# Patient Record
Sex: Male | Born: 1955 | Race: White | Hispanic: No | Smoking: Current every day smoker
Health system: Southern US, Community
[De-identification: ages and names within clinical notes are randomized; demographics above are authoritative.]

## PROBLEM LIST (undated history)

## (undated) DIAGNOSIS — E669 Obesity, unspecified: Secondary | ICD-10-CM

## (undated) DIAGNOSIS — Z0000 Encounter for general adult medical examination without abnormal findings: Secondary | ICD-10-CM

## (undated) DIAGNOSIS — M1990 Unspecified osteoarthritis, unspecified site: Secondary | ICD-10-CM

## (undated) DIAGNOSIS — E119 Type 2 diabetes mellitus without complications: Secondary | ICD-10-CM

## (undated) DIAGNOSIS — I82409 Acute embolism and thrombosis of unspecified deep veins of unspecified lower extremity: Secondary | ICD-10-CM

## (undated) DIAGNOSIS — Z23 Encounter for immunization: Secondary | ICD-10-CM

## (undated) DIAGNOSIS — F17200 Nicotine dependence, unspecified, uncomplicated: Secondary | ICD-10-CM

## (undated) DIAGNOSIS — N189 Chronic kidney disease, unspecified: Secondary | ICD-10-CM

## (undated) DIAGNOSIS — K2970 Gastritis, unspecified, without bleeding: Secondary | ICD-10-CM

## (undated) HISTORY — DX: Encounter for immunization: Z23

## (undated) HISTORY — DX: Type 2 diabetes mellitus without complications: E11.9

## (undated) HISTORY — DX: Obesity, unspecified: E66.9

## (undated) HISTORY — PX: WISDOM TOOTH EXTRACTION: SHX21

## (undated) HISTORY — DX: Nicotine dependence, unspecified, uncomplicated: F17.200

## (undated) HISTORY — DX: Encounter for general adult medical examination without abnormal findings: Z00.00

---

## 1994-05-24 DIAGNOSIS — I82409 Acute embolism and thrombosis of unspecified deep veins of unspecified lower extremity: Secondary | ICD-10-CM

## 1994-05-24 HISTORY — DX: Acute embolism and thrombosis of unspecified deep veins of unspecified lower extremity: I82.409

## 2009-03-08 ENCOUNTER — Emergency Department: Payer: Self-pay | Admitting: Internal Medicine

## 2009-10-01 ENCOUNTER — Emergency Department: Payer: Self-pay | Admitting: Emergency Medicine

## 2009-10-01 ENCOUNTER — Ambulatory Visit: Payer: Self-pay | Admitting: Family Medicine

## 2013-05-19 ENCOUNTER — Emergency Department: Payer: Self-pay | Admitting: Emergency Medicine

## 2013-05-19 LAB — COMPREHENSIVE METABOLIC PANEL WITH GFR
Albumin: 3.9 g/dL
Alkaline Phosphatase: 83 U/L
Anion Gap: 5 — ABNORMAL LOW
BUN: 14 mg/dL
Bilirubin,Total: 1.3 mg/dL — ABNORMAL HIGH
Calcium, Total: 8.5 mg/dL
Chloride: 107 mmol/L
Co2: 25 mmol/L
Creatinine: 0.74 mg/dL
EGFR (African American): >60
EGFR (Non-African Amer.): >60
Glucose: 124 mg/dL — ABNORMAL HIGH
Osmolality: 276
Potassium: 3.9 mmol/L
SGOT(AST): 33 U/L
SGPT (ALT): 33 U/L
Sodium: 137 mmol/L
Total Protein: 7.4 g/dL

## 2013-05-19 LAB — CBC WITH DIFFERENTIAL/PLATELET
Basophil %: 0.6 %
Eosinophil #: 0.1 10*3/uL (ref 0.0–0.7)
Eosinophil %: 0.7 %
HCT: 43.6 % (ref 40.0–52.0)
HGB: 15.4 g/dL (ref 13.0–18.0)
Lymphocyte #: 1.4 10*3/uL (ref 1.0–3.6)
Lymphocyte %: 12.9 %
Neutrophil #: 8.2 10*3/uL — ABNORMAL HIGH (ref 1.4–6.5)
Neutrophil %: 74.8 %
Platelet: 190 10*3/uL (ref 150–440)
RBC: 4.9 10*6/uL (ref 4.40–5.90)
RDW: 13.2 % (ref 11.5–14.5)
WBC: 11 10*3/uL — ABNORMAL HIGH (ref 3.8–10.6)

## 2013-05-19 LAB — URINALYSIS, COMPLETE
Bacteria: NONE SEEN
Bilirubin,UR: NEGATIVE
Blood: NEGATIVE
Glucose,UR: NEGATIVE mg/dL
Ketone: NEGATIVE
Leukocyte Esterase: NEGATIVE
Nitrite: NEGATIVE
Ph: 6
Protein: NEGATIVE
RBC,UR: 1 /HPF
Specific Gravity: 1.025
Squamous Epithelial: NONE SEEN
WBC UR: <1 /HPF

## 2013-05-19 LAB — LIPASE, BLOOD: Lipase: 228 U/L (ref 73–393)

## 2015-02-17 ENCOUNTER — Encounter: Payer: Self-pay | Admitting: Family Medicine

## 2015-02-17 ENCOUNTER — Ambulatory Visit (INDEPENDENT_AMBULATORY_CARE_PROVIDER_SITE_OTHER): Payer: No Typology Code available for payment source | Admitting: Family Medicine

## 2015-02-17 VITALS — BP 130/87 | HR 92 | Temp 97.4°F | Resp 16 | Ht 66.0 in | Wt 215.6 lb

## 2015-02-17 DIAGNOSIS — E669 Obesity, unspecified: Secondary | ICD-10-CM | POA: Diagnosis not present

## 2015-02-17 DIAGNOSIS — F17200 Nicotine dependence, unspecified, uncomplicated: Secondary | ICD-10-CM

## 2015-02-17 DIAGNOSIS — Z720 Tobacco use: Secondary | ICD-10-CM

## 2015-02-17 DIAGNOSIS — E66811 Obesity, class 1: Secondary | ICD-10-CM

## 2015-02-17 DIAGNOSIS — S39012A Strain of muscle, fascia and tendon of lower back, initial encounter: Secondary | ICD-10-CM

## 2015-02-17 DIAGNOSIS — E119 Type 2 diabetes mellitus without complications: Secondary | ICD-10-CM | POA: Diagnosis not present

## 2015-02-17 HISTORY — DX: Nicotine dependence, unspecified, uncomplicated: F17.200

## 2015-02-17 HISTORY — DX: Obesity, class 1: E66.811

## 2015-02-17 HISTORY — DX: Obesity, unspecified: E66.9

## 2015-02-17 LAB — POCT GLYCOSYLATED HEMOGLOBIN (HGB A1C): Hemoglobin A1C: 6.1

## 2015-02-17 MED ORDER — NAPROXEN 500 MG PO TABS: 500.0000 mg | ORAL_TABLET | Freq: Two times a day (BID) | ORAL | Status: DC

## 2015-02-17 MED ORDER — CYCLOBENZAPRINE HCL 10 MG PO TABS: 10.0000 mg | ORAL_TABLET | Freq: Every day | ORAL | Status: DC

## 2015-02-17 NOTE — Progress Notes (Signed)
Date:  02/17/2015   Name:  Craig Bruce   DOB:  17-Apr-1956   MRN:  [id number]  PCP:  Dicky Doe, MD    Chief Complaint: Back Pain   History of Present Illness:  This is a 59 y.o. male c/o R LBP for 4 days after carrying heavy box at work, no acute pain but developed later that evening, Aleve helps some but pain returns, sharp/throbbing, worse with sitting, no radiation, not sleeping well at night. Also hx T2Dm, on metformin in past, last a1c 1 year ago.   Review of Systems:  Review of Systems  Constitutional: Negative for fever and chills.  Cardiovascular: Negative for leg swelling.  Gastrointestinal: Negative for abdominal pain.  Neurological: Negative for weakness and numbness.    Patient Active Problem List   Diagnosis Date Noted  . Obesity (BMI 30.0-34.9) 02/17/2015  . Smoker 02/17/2015  . Diabetes 02/17/2015    Prior to Admission medications   Medication Sig Start Date End Date Taking? Authorizing Yanissa Michalsky  cyclobenzaprine (FLEXERIL) 10 MG tablet Take 1 tablet (10 mg total) by mouth at bedtime. 02/17/15   Adline Potter, MD  naproxen (NAPROSYN) 500 MG tablet Take 1 tablet (500 mg total) by mouth 2 (two) times daily with a meal. 02/17/15   Adline Potter, MD    Not on File  No past surgical history on file.  Social History  Substance Use Topics  . Smoking status: Current Some Day Smoker  . Smokeless tobacco: Not on file  . Alcohol Use: 0.0 oz/week    0 Standard drinks or equivalent per week    No family history on file.  Medication list has been reviewed and updated.  Physical Examination: BP 130/87 mmHg  Pulse 92  Temp(Src) 97.4 F (36.3 C) (Oral)  Resp 16  Ht 5\' 6"  (1.676 m)  Wt 215 lb 9.6 oz (97.796 kg)  BMI 34.82 kg/m2  Physical Exam  Constitutional: He is oriented to person, place, and time. He appears well-developed and well-nourished.  Musculoskeletal: He exhibits no edema.  Tender R paralumbar region no spinal tenderness neg B SLR distal  strength intact  Neurological: He is alert and oriented to person, place, and time. Coordination normal.  Skin: Skin is warm and dry.  Psychiatric: He has a normal mood and affect. His behavior is normal.  Nursing note and vitals reviewed.   Assessment and Plan:  1. Lumbar strain, initial encounter Light duty note given, no lifting over 20# this week, call if sxs worsen/persist - naproxen (NAPROSYN) 500 MG tablet; Take 1 tablet (500 mg total) by mouth 2 (two) times daily with a meal.  Dispense: 30 tablet; Refill: 0 - cyclobenzaprine (FLEXERIL) 10 MG tablet; Take 1 tablet (10 mg total) by mouth at bedtime.  Dispense: 15 tablet; Refill: 0  2. Obesity (BMI 30.0-34.9) Discuss weight loss/exercise  3. Smoker Advised cessation   4. Diabetes mellitus without complication U9N today 2.3%, will not restart metformin, discuss further with Dr. Luan Pulling next visit - POCT HgB A1C   Return in about 3 months (around 05/19/2015).  Satira Anis. [facility]  02/17/2015

## 2015-03-11 ENCOUNTER — Other Ambulatory Visit: Payer: Self-pay

## 2015-03-11 DIAGNOSIS — S39012A Strain of muscle, fascia and tendon of lower back, initial encounter: Secondary | ICD-10-CM

## 2015-03-11 MED ORDER — CYCLOBENZAPRINE HCL 10 MG PO TABS: 10.0000 mg | ORAL_TABLET | Freq: Every day | ORAL | Status: DC

## 2015-03-11 MED ORDER — NAPROXEN 500 MG PO TABS: 500.0000 mg | ORAL_TABLET | Freq: Two times a day (BID) | ORAL | Status: DC

## 2015-03-24 ENCOUNTER — Encounter: Payer: Self-pay | Admitting: Family Medicine

## 2015-03-24 ENCOUNTER — Ambulatory Visit (INDEPENDENT_AMBULATORY_CARE_PROVIDER_SITE_OTHER): Payer: No Typology Code available for payment source | Admitting: Family Medicine

## 2015-03-24 VITALS — BP 130/80 | HR 92 | Temp 98.2°F | Resp 16 | Ht 67.0 in | Wt 216.0 lb

## 2015-03-24 DIAGNOSIS — F17200 Nicotine dependence, unspecified, uncomplicated: Secondary | ICD-10-CM

## 2015-03-24 DIAGNOSIS — Z23 Encounter for immunization: Secondary | ICD-10-CM | POA: Diagnosis not present

## 2015-03-24 DIAGNOSIS — Z0000 Encounter for general adult medical examination without abnormal findings: Secondary | ICD-10-CM | POA: Diagnosis not present

## 2015-03-24 DIAGNOSIS — Z720 Tobacco use: Secondary | ICD-10-CM | POA: Diagnosis not present

## 2015-03-24 DIAGNOSIS — E119 Type 2 diabetes mellitus without complications: Secondary | ICD-10-CM | POA: Diagnosis not present

## 2015-03-24 HISTORY — DX: Encounter for immunization: Z23

## 2015-03-24 HISTORY — DX: Encounter for general adult medical examination without abnormal findings: Z00.00

## 2015-03-24 LAB — POCT GLYCOSYLATED HEMOGLOBIN (HGB A1C): Hemoglobin A1C: 6

## 2015-03-24 NOTE — Patient Instructions (Signed)
Continue to watch diet 
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
## 2015-03-25 LAB — CBC WITH DIFFERENTIAL/PLATELET
BASOS ABS: 0 10*3/uL (ref 0.0–0.2)
Basos: 0 %
EOS (ABSOLUTE): 0.2 10*3/uL (ref 0.0–0.4)
Eos: 3 %
Hematocrit: 45.1 % (ref 37.5–51.0)
Hemoglobin: 16 g/dL (ref 12.6–17.7)
IMMATURE GRANS (ABS): 0 10*3/uL (ref 0.0–0.1)
IMMATURE GRANULOCYTES: 0 %
LYMPHS: 28 %
Lymphocytes Absolute: 1.6 10*3/uL (ref 0.7–3.1)
MCH: 32 pg (ref 26.6–33.0)
MCHC: 35.5 g/dL (ref 31.5–35.7)
MCV: 90 fL (ref 79–97)
Monocytes Absolute: 0.5 10*3/uL (ref 0.1–0.9)
Monocytes: 10 %
NEUTROS PCT: 59 %
Neutrophils Absolute: 3.2 10*3/uL (ref 1.4–7.0)
PLATELETS: 190 10*3/uL (ref 150–379)
RBC: 5 x10E6/uL (ref 4.14–5.80)
RDW: 13.4 % (ref 12.3–15.4)
WBC: 5.5 10*3/uL (ref 3.4–10.8)

## 2015-03-26 LAB — LIPID PANEL
CHOL/HDL RATIO: 6.4 ratio — AB (ref 0.0–5.0)
CHOLESTEROL TOTAL: 243 mg/dL — AB (ref 100–199)
HDL: 38 mg/dL — AB (ref >39)
LDL CALC: 126 mg/dL — AB (ref 0–99)
TRIGLYCERIDES: 394 mg/dL — AB (ref 0–149)
VLDL Cholesterol Cal: 79 mg/dL — ABNORMAL HIGH (ref 5–40)

## 2015-03-26 LAB — COMPREHENSIVE METABOLIC PANEL
A/G RATIO: 1.7 (ref 1.1–2.5)
ALBUMIN: 4.2 g/dL (ref 3.6–4.8)
ALT: 43 IU/L (ref 0–44)
AST: 33 IU/L (ref 0–40)
Alkaline Phosphatase: 81 IU/L (ref 39–117)
BILIRUBIN TOTAL: 0.7 mg/dL (ref 0.0–1.2)
BUN / CREAT RATIO: 21 (ref 10–22)
BUN: 15 mg/dL (ref 8–27)
CALCIUM: 8.9 mg/dL (ref 8.6–10.2)
CO2: 22 mmol/L (ref 18–29)
Chloride: 99 mmol/L (ref 97–106)
Creatinine, Ser: 0.73 mg/dL — ABNORMAL LOW (ref 0.76–1.27)
GFR, EST AFRICAN AMERICAN: 117 mL/min/{1.73_m2} (ref >59)
GFR, EST NON AFRICAN AMERICAN: 101 mL/min/{1.73_m2} (ref >59)
GLOBULIN, TOTAL: 2.5 g/dL (ref 1.5–4.5)
Glucose: 236 mg/dL — ABNORMAL HIGH (ref 65–99)
Potassium: 4.6 mmol/L (ref 3.5–5.2)
Sodium: 140 mmol/L (ref 136–144)
TOTAL PROTEIN: 6.7 g/dL (ref 6.0–8.5)

## 2015-04-07 ENCOUNTER — Other Ambulatory Visit: Payer: Self-pay | Admitting: Family Medicine

## 2015-04-07 ENCOUNTER — Telehealth: Payer: Self-pay | Admitting: Family Medicine

## 2015-04-07 DIAGNOSIS — S39012A Strain of muscle, fascia and tendon of lower back, initial encounter: Secondary | ICD-10-CM

## 2015-04-07 MED ORDER — NAPROXEN 500 MG PO TABS: 500.0000 mg | ORAL_TABLET | Freq: Two times a day (BID) | ORAL | Status: DC

## 2015-04-07 NOTE — Telephone Encounter (Signed)
I have sent in order.-jh

## 2015-04-07 NOTE — Telephone Encounter (Signed)
Pt.called need refill on   Naproxen 500 mg. Pt call back # is  [phone]

## 2015-04-30 ENCOUNTER — Ambulatory Visit: Payer: Managed Care, Other (non HMO)

## 2015-04-30 ENCOUNTER — Other Ambulatory Visit
Admission: RE | Admit: 2015-04-30 | Discharge: 2015-04-30 | Disposition: A | Discharge status: Home / Self Care | Payer: Managed Care, Other (non HMO) | Source: Ambulatory Visit | Attending: Family Medicine | Admitting: Family Medicine

## 2015-04-30 ENCOUNTER — Ambulatory Visit (INDEPENDENT_AMBULATORY_CARE_PROVIDER_SITE_OTHER): Payer: Managed Care, Other (non HMO) | Admitting: Family Medicine

## 2015-04-30 ENCOUNTER — Ambulatory Visit
Admission: RE | Admit: 2015-04-30 | Discharge: 2015-04-30 | Disposition: A | Discharge status: Home / Self Care | Payer: Managed Care, Other (non HMO) | Source: Ambulatory Visit | Attending: Family Medicine | Admitting: Family Medicine

## 2015-04-30 ENCOUNTER — Other Ambulatory Visit: Payer: Self-pay | Admitting: Family Medicine

## 2015-04-30 ENCOUNTER — Encounter: Payer: Self-pay | Admitting: Family Medicine

## 2015-04-30 VITALS — BP 128/83 | HR 94 | Resp 16 | Ht 68.0 in | Wt 220.0 lb

## 2015-04-30 DIAGNOSIS — I861 Scrotal varices: Secondary | ICD-10-CM | POA: Insufficient documentation

## 2015-04-30 DIAGNOSIS — R1031 Right lower quadrant pain: Secondary | ICD-10-CM

## 2015-04-30 DIAGNOSIS — N509 Disorder of male genital organs, unspecified: Secondary | ICD-10-CM | POA: Insufficient documentation

## 2015-04-30 DIAGNOSIS — N5089 Other specified disorders of the male genital organs: Secondary | ICD-10-CM

## 2015-04-30 DIAGNOSIS — N50819 Testicular pain, unspecified: Secondary | ICD-10-CM

## 2015-04-30 LAB — CBC WITH DIFFERENTIAL/PLATELET
BASOS ABS: 0 10*3/uL (ref 0–0.1)
BASOS PCT: 1 %
EOS ABS: 0.2 10*3/uL (ref 0–0.7)
EOS PCT: 2 %
HCT: 45.5 % (ref 40.0–52.0)
Hemoglobin: 15.6 g/dL (ref 13.0–18.0)
Lymphocytes Relative: 27 %
Lymphs Abs: 2.1 10*3/uL (ref 1.0–3.6)
MCH: 31.2 pg (ref 26.0–34.0)
MCHC: 34.3 g/dL (ref 32.0–36.0)
MCV: 91.1 fL (ref 80.0–100.0)
MONO ABS: 0.6 10*3/uL (ref 0.2–1.0)
MONOS PCT: 8 %
NEUTROS ABS: 5 10*3/uL (ref 1.4–6.5)
Neutrophils Relative %: 62 %
PLATELETS: 214 10*3/uL (ref 150–440)
RBC: 4.99 MIL/uL (ref 4.40–5.90)
RDW: 12.9 % (ref 11.5–14.5)
WBC: 7.9 10*3/uL (ref 3.8–10.6)

## 2015-04-30 MED ORDER — TRAMADOL HCL 50 MG PO TABS: 50.0000 mg | ORAL_TABLET | Freq: Three times a day (TID) | ORAL | Status: DC | PRN

## 2015-04-30 NOTE — Addendum Note (Signed)
Addended by: Devona Konig on: 04/30/2015 11:29 AM   Modules accepted: Orders

## 2015-04-30 NOTE — Progress Notes (Signed)
Name: Craig Bruce   MRN: [id number]    DOB: 09/05/1955   Date:04/30/2015       Progress Note  Subjective  Chief Complaint  Chief Complaint  Patient presents with  . Muscle Pain    Right lower abd pulled muscle and swelling in genitals.     HPI  Moving a bed 4-5  days ago.  Felt a pull in RLQ.  Pain still there and swelling into scrotum. No problem-specific assessment & plan notes found for this encounter.   History reviewed. No pertinent past medical history.  Social History  Substance Use Topics  . Smoking status: Current Some Day Smoker -- 0.50 packs/day    Types: Cigarettes  . Smokeless tobacco: Not on file  . Alcohol Use: 1.2 oz/week    0 Standard drinks or equivalent, 2 Cans of beer per week     Current outpatient prescriptions:  .  naproxen (NAPROSYN) 500 MG tablet, Take 1 tablet (500 mg total) by mouth 2 (two) times daily with a meal., Disp: 60 tablet, Rfl: 2  No Known Allergies  Review of Systems  Constitutional: Negative for fever, chills, weight loss and malaise/fatigue.  Respiratory: Negative for cough, shortness of breath and wheezing.   Cardiovascular: Negative for chest pain, palpitations and leg swelling.  Gastrointestinal: Positive for abdominal pain. Negative for heartburn, nausea, vomiting, diarrhea and blood in stool.  Musculoskeletal: Negative for myalgias and joint pain.  Neurological: Negative for weakness.      Objective  Filed Vitals:   04/30/15 1037  BP: 128/83  Pulse: 94  Resp: 16  Height: 5\' 8"  (1.727 m)  Weight: 220 lb (99.791 kg)     Physical Exam  Constitutional: He appears distressed (appears uncomfortable).  Abdominal: There is tenderness (Tender to palpation in RLQ and also in LLQ.  Pain in lower abd and into groain.  No abd. wall defect felt.).  Genitourinary:  R testis very tender to palp with some swelling of testicle.      Recent Results (from the past 2160 hour(s))  POCT HgB A1C     Status: Normal   Collection Time: 02/17/15 10:35 AM  Result Value Ref Range   Hemoglobin A1C 6.1   POCT HgB A1C     Status: Abnormal   Collection Time: 03/24/15  3:15 PM  Result Value Ref Range   Hemoglobin A1C 6.0   Comprehensive Metabolic Panel (CMET)     Status: Abnormal   Collection Time: 03/25/15  8:02 AM  Result Value Ref Range   Glucose 236 (H) 65 - 99 mg/dL   BUN 15 8 - 27 mg/dL   Creatinine, Ser 0.73 (L) 0.76 - 1.27 mg/dL   GFR calc non Af Amer 101 >59 mL/min/1.73   GFR calc Af Amer 117 >59 mL/min/1.73   BUN/Creatinine Ratio 21 10 - 22   Sodium 140 136 - 144 mmol/L   Potassium 4.6 3.5 - 5.2 mmol/L   Chloride 99 97 - 106 mmol/L   CO2 22 18 - 29 mmol/L   Calcium 8.9 8.6 - 10.2 mg/dL   Total Protein 6.7 6.0 - 8.5 g/dL   Albumin 4.2 3.6 - 4.8 g/dL   Globulin, Total 2.5 1.5 - 4.5 g/dL   Albumin/Globulin Ratio 1.7 1.1 - 2.5   Bilirubin Total 0.7 0.0 - 1.2 mg/dL   Alkaline Phosphatase 81 39 - 117 IU/L   AST 33 0 - 40 IU/L   ALT 43 0 - 44 IU/L  CBC with Differential  Status: None   Collection Time: 03/25/15  8:02 AM  Result Value Ref Range   WBC 5.5 3.4 - 10.8 x10E3/uL   RBC 5.00 4.14 - 5.80 x10E6/uL   Hemoglobin 16.0 12.6 - 17.7 g/dL   Hematocrit 45.1 37.5 - 51.0 %   MCV 90 79 - 97 fL   MCH 32.0 26.6 - 33.0 pg   MCHC 35.5 31.5 - 35.7 g/dL   RDW 13.4 12.3 - 15.4 %   Platelets 190 150 - 379 x10E3/uL   Neutrophils 59 %   Lymphs 28 %   Monocytes 10 %   Eos 3 %   Basos 0 %   Neutrophils Absolute 3.2 1.4 - 7.0 x10E3/uL   Lymphocytes Absolute 1.6 0.7 - 3.1 x10E3/uL   Monocytes Absolute 0.5 0.1 - 0.9 x10E3/uL   EOS (ABSOLUTE) 0.2 0.0 - 0.4 x10E3/uL   Basophils Absolute 0.0 0.0 - 0.2 x10E3/uL   Immature Granulocytes 0 %   Immature Grans (Abs) 0.0 0.0 - 0.1 x10E3/uL  Lipid Profile     Status: Abnormal   Collection Time: 03/25/15  8:02 AM  Result Value Ref Range   Cholesterol, Total 243 (H) 100 - 199 mg/dL   Triglycerides 394 (H) 0 - 149 mg/dL   HDL 38 (L) >39 mg/dL    Comment:  According to ATP-III Guidelines, HDL-C >59 mg/dL is considered a negative risk factor for CHD.    VLDL Cholesterol Cal 79 (H) 5 - 40 mg/dL   LDL Calculated 126 (H) 0 - 99 mg/dL   Chol/HDL Ratio 6.4 (H) 0.0 - 5.0 ratio units    Comment:                                   T. Chol/HDL Ratio                                             Men  Women                               1/2 Avg.Risk  3.4    3.3                                   Avg.Risk  5.0    4.4                                2X Avg.Risk  9.6    7.1                                3X Avg.Risk 23.4   11.0      Assessment & Plan  1. Right lower quadrant abdominal pain  - US Scrotum; Future - CBC with Differential - traMADol (ULTRAM) 50 MG tablet; Take 1 tablet (50 mg total) by mouth every 8 (eight) hours as needed.  Dispense: 20 tablet; Refill: 0  _plan referral to [facility] If testicular US is ok.

## 2015-04-30 NOTE — Addendum Note (Signed)
Addended by: Devona Konig on: 04/30/2015 11:51 AM   Modules accepted: Orders

## 2015-05-01 ENCOUNTER — Ambulatory Visit: Payer: Managed Care, Other (non HMO)

## 2015-05-01 ENCOUNTER — Encounter: Payer: Self-pay | Admitting: Urology

## 2015-05-01 ENCOUNTER — Ambulatory Visit (INDEPENDENT_AMBULATORY_CARE_PROVIDER_SITE_OTHER): Payer: Managed Care, Other (non HMO) | Admitting: Urology

## 2015-05-01 VITALS — BP 130/85 | HR 93 | Ht 68.0 in | Wt 218.3 lb

## 2015-05-01 DIAGNOSIS — N509 Disorder of male genital organs, unspecified: Secondary | ICD-10-CM | POA: Diagnosis not present

## 2015-05-01 DIAGNOSIS — N453 Epididymo-orchitis: Secondary | ICD-10-CM | POA: Diagnosis not present

## 2015-05-01 DIAGNOSIS — N5089 Other specified disorders of the male genital organs: Secondary | ICD-10-CM

## 2015-05-01 MED ORDER — SULFAMETHOXAZOLE-TRIMETHOPRIM 800-160 MG PO TABS: 1.0000 | ORAL_TABLET | Freq: Two times a day (BID) | ORAL | Status: DC

## 2015-05-01 NOTE — Addendum Note (Signed)
Addended by: Lestine Box on: 05/01/2015 03:19 PM   Modules accepted: Orders

## 2015-05-01 NOTE — Progress Notes (Signed)
05/01/2015 2:47 PM   Craig Bruce 1955-09-28 [id number]  Referring provider: Arlis Porta., MD [address] [place], [place] [address]  Chief Complaint  Patient presents with  . Testicular Mass    14 mm right testicular mass, Left varicocele    HPI: Mr Craig Bruce is a 59yo seen in consultation for a right testicular mass. He is referred by Dr. Luan Pulling. He was seen yesterday for right testicular pain which had been present for 1 week. His right testicle swelled to the size of an orange. He has severe sharp constant right testicular pain. He has pain with sitting. He denies trauma.  NO fevers/chills/sweats. He has right lower back pain.      PMH: Past Medical History  Diagnosis Date  . Diabetes mellitus type 2, controlled, without complications ([place]) A999333  . Obesity (BMI 30.0-34.9) 02/17/2015  . Smoker 02/17/2015  . Annual physical exam 03/24/2015  . Need for influenza vaccination 03/24/2015  . Immunization due 03/24/2015    Surgical History: Past Surgical History  Procedure Laterality Date  . No past surgeries      Home Medications:    Medication List       This list is accurate as of: 05/01/15  2:47 PM.  Always use your most recent med list.               naproxen 500 MG tablet  Commonly known as:  NAPROSYN  Take 1 tablet (500 mg total) by mouth 2 (two) times daily with a meal.     traMADol 50 MG tablet  Commonly known as:  ULTRAM  Take 1 tablet (50 mg total) by mouth every 8 (eight) hours as needed.        Allergies: No Known Allergies  Family History: Family History  Problem Relation Age of Onset  . Prostate cancer Neg Hx   . Kidney cancer Neg Hx   . Kidney failure Neg Hx     Social History:  reports that he has been smoking Cigarettes.  He has been smoking about 0.50 packs per day. He does not have any smokeless tobacco history on file. He reports that he drinks about 1.2 oz of alcohol per week. He reports that he does not use illicit  drugs.  ROS: UROLOGY Frequent Urination?: Yes Hard to postpone urination?: No Burning/pain with urination?: No Get up at night to urinate?: Yes Leakage of urine?: Yes Urine stream starts and stops?: No Trouble starting stream?: No Do you have to strain to urinate?: No Blood in urine?: No Urinary tract infection?: No Sexually transmitted disease?: No Injury to kidneys or bladder?: No Painful intercourse?: No Weak stream?: No Erection problems?: No Penile pain?: No  Gastrointestinal Nausea?: No Vomiting?: No Indigestion/heartburn?: No Diarrhea?: No Constipation?: No  Constitutional Fever: No Night sweats?: No Weight loss?: No Fatigue?: No  Skin Skin rash/lesions?: No Itching?: No  Eyes Blurred vision?: No Double vision?: No  Ears/Nose/Throat Sore throat?: No Sinus problems?: No  Hematologic/Lymphatic Swollen glands?: No Easy bruising?: No  Cardiovascular Leg swelling?: Yes Chest pain?: No  Respiratory Cough?: No Shortness of breath?: No  Endocrine Excessive thirst?: No  Musculoskeletal Back pain?: Yes Joint pain?: Yes  Neurological Headaches?: No Dizziness?: No  Psychologic Depression?: No Anxiety?: No  Physical Exam: BP 130/85 mmHg  Pulse 93  Ht 5\' 8"  (1.727 m)  Wt 99.02 kg (218 lb 4.8 oz)  BMI 33.20 kg/m2  Constitutional:  Alert and oriented, No acute distress. HEENT:  AT,  moist mucus membranes.  Trachea midline, no masses. Cardiovascular: No clubbing, cyanosis, or edema. Respiratory: Normal respiratory effort, no increased work of breathing. GI: Abdomen is soft, nontender, nondistended, no abdominal masses GU: No CVA tenderness. Uncircumcised phallus. Testes down bilaterally. Severe right testicular pain to palpation with severe right epididymal head tenderness. Normal left testicle  Skin: No rashes, bruises or suspicious lesions. Lymph: No cervical or inguinal adenopathy. Neurologic: Grossly intact, no focal deficits, moving  all 4 extremities. Psychiatric: Normal mood and affect.  Laboratory Data: Lab Results  Component Value Date   WBC 7.9 04/30/2015   HGB 15.6 04/30/2015   HCT 45.5 04/30/2015   MCV 91.1 04/30/2015   PLT 214 04/30/2015    Lab Results  Component Value Date   CREATININE 0.73* 03/25/2015    No results found for: PSA  No results found for: TESTOSTERONE  Lab Results  Component Value Date   HGBA1C 6.0 03/24/2015    Urinalysis    Component Value Date/Time   COLORURINE Yellow 05/19/2013 0254   APPEARANCEUR Clear 05/19/2013 0254   LABSPEC 1.025 05/19/2013 0254   PHURINE 6.0 05/19/2013 0254   GLUCOSEU Negative 05/19/2013 0254   HGBUR Negative 05/19/2013 0254   BILIRUBINUR Negative 05/19/2013 0254   KETONESUR Negative 05/19/2013 0254   PROTEINUR Negative 05/19/2013 0254   NITRITE Negative 05/19/2013 0254   LEUKOCYTESUR Negative 05/19/2013 0254    Pertinent Imaging:   Assessment & Plan:    1. Right testicular mass/possible abscess -tumor markers today, will call with results -bactrim DS BID for 30 days  2. Epididymo-orchitis -bactrim DS BID for 30 days -RTC 1 month  There are no diagnoses linked to this encounter.  No Follow-up on file.  Cleon Gustin, [facility] [address], [place], [place] [address] [phone]

## 2015-05-02 LAB — LACTATE DEHYDROGENASE: LDH: 281 IU/L — ABNORMAL HIGH (ref 121–224)

## 2015-05-02 LAB — AFP TUMOR MARKER: AFP-Tumor Marker: 1.9 ng/mL (ref 0.0–8.3)

## 2015-05-02 LAB — BETA HCG QUANT (REF LAB): hCG Quant: <1 m[IU]/mL (ref 0–3)

## 2015-05-12 ENCOUNTER — Ambulatory Visit: Payer: Managed Care, Other (non HMO) | Admitting: Family Medicine

## 2015-05-13 ENCOUNTER — Ambulatory Visit: Payer: Managed Care, Other (non HMO) | Admitting: Family Medicine

## 2015-05-14 ENCOUNTER — Other Ambulatory Visit: Payer: Self-pay

## 2015-05-14 DIAGNOSIS — N5089 Other specified disorders of the male genital organs: Secondary | ICD-10-CM

## 2015-05-15 ENCOUNTER — Ambulatory Visit
Admission: RE | Admit: 2015-05-15 | Discharge: 2015-05-15 | Disposition: A | Discharge status: Home / Self Care | Payer: Managed Care, Other (non HMO) | Source: Ambulatory Visit | Attending: Urology | Admitting: Urology

## 2015-05-15 DIAGNOSIS — N433 Hydrocele, unspecified: Secondary | ICD-10-CM | POA: Insufficient documentation

## 2015-05-15 DIAGNOSIS — N453 Epididymo-orchitis: Secondary | ICD-10-CM

## 2015-05-15 DIAGNOSIS — N509 Disorder of male genital organs, unspecified: Secondary | ICD-10-CM | POA: Insufficient documentation

## 2015-05-15 DIAGNOSIS — N5089 Other specified disorders of the male genital organs: Secondary | ICD-10-CM

## 2015-05-21 ENCOUNTER — Telehealth: Payer: Self-pay

## 2015-05-21 NOTE — Telephone Encounter (Signed)
Spoke with pt in reference to results of scrotal u/s. Reinforced with pt to keep appt tomorrow. Pt voiced understanding.

## 2015-05-21 NOTE — Telephone Encounter (Signed)
-----   Message from Hollice Espy, MD sent at 05/20/2015  5:26 PM EST ----- This resulted in detail in my inbox inadvertently. He has a follow-up on 05/22/2015 with one of the [facility] doctors. It is extremely important that he come to this appointment. It does appear he may have a testicular mass.  Please call and remind him about the importance of following up.  Hollice Espy, MD

## 2015-05-22 ENCOUNTER — Encounter: Payer: Self-pay | Admitting: Urology

## 2015-05-22 ENCOUNTER — Ambulatory Visit (INDEPENDENT_AMBULATORY_CARE_PROVIDER_SITE_OTHER): Payer: Managed Care, Other (non HMO) | Admitting: Urology

## 2015-05-22 ENCOUNTER — Telehealth: Payer: Self-pay | Admitting: Radiology

## 2015-05-22 VITALS — BP 115/78 | HR 96 | Ht 68.0 in | Wt 219.3 lb

## 2015-05-22 DIAGNOSIS — N509 Disorder of male genital organs, unspecified: Secondary | ICD-10-CM

## 2015-05-22 DIAGNOSIS — N5089 Other specified disorders of the male genital organs: Secondary | ICD-10-CM

## 2015-05-22 LAB — URINALYSIS, COMPLETE
BILIRUBIN UA: NEGATIVE
Glucose, UA: NEGATIVE
Ketones, UA: NEGATIVE
LEUKOCYTES UA: NEGATIVE
Nitrite, UA: NEGATIVE
PH UA: 6 (ref 5.0–7.5)
Protein, UA: NEGATIVE
RBC UA: NEGATIVE
Specific Gravity, UA: 1.01 (ref 1.005–1.030)
Urobilinogen, Ur: 0.2 mg/dL (ref 0.2–1.0)

## 2015-05-22 LAB — MICROSCOPIC EXAMINATION
BACTERIA UA: NONE SEEN
EPITHELIAL CELLS (NON RENAL): NONE SEEN /HPF (ref 0–10)
RBC, UA: NONE SEEN /hpf (ref 0–?)
WBC, UA: NONE SEEN /hpf (ref 0–?)

## 2015-05-22 NOTE — Telephone Encounter (Signed)
Notified pt of surgery scheduled 06/25/15, pre-admit phone interview on 06/12/15 between 9am-1pm and to call day prior to surgery for arrival time to [facility]. Pt voices understanding.

## 2015-05-22 NOTE — Progress Notes (Signed)
05/22/2015 9:11 AM   Bernadette Hoit 01/03/1956 [id number]  Referring provider: Arlis Porta., MD [address] [place], [place] [address]  Chief Complaint  Patient presents with  . Follow-up    testicular mass, diagnostic image     HPI: Mr Doren is a 59yo seen today for right testis mass. He took 30 days of abx and the pain has resolved. US shows a persistent mass with blood flow in the upper right testis. LDH was mildly elevated but tumor markers were normal.    PMH: Past Medical History  Diagnosis Date  . Diabetes mellitus type 2, controlled, without complications ([place]) A999333  . Obesity (BMI 30.0-34.9) 02/17/2015  . Smoker 02/17/2015  . Annual physical exam 03/24/2015  . Need for influenza vaccination 03/24/2015  . Immunization due 03/24/2015    Surgical History: Past Surgical History  Procedure Laterality Date  . No past surgeries      Home Medications:    Medication List       This list is accurate as of: 05/22/15  9:11 AM.  Always use your most recent med list.               naproxen 500 MG tablet  Commonly known as:  NAPROSYN  Take 1 tablet (500 mg total) by mouth 2 (two) times daily with a meal.     OVER THE COUNTER MEDICATION  Omega XL     sulfamethoxazole-trimethoprim 800-160 MG tablet  Commonly known as:  BACTRIM DS,SEPTRA DS  Take 1 tablet by mouth 2 (two) times daily.     traMADol 50 MG tablet  Commonly known as:  ULTRAM  Take 1 tablet (50 mg total) by mouth every 8 (eight) hours as needed.        Allergies: No Known Allergies  Family History: Family History  Problem Relation Age of Onset  . Prostate cancer Neg Hx   . Kidney cancer Neg Hx   . Kidney failure Neg Hx     Social History:  reports that he has been smoking Cigarettes.  He has been smoking about 0.50 packs per day. He does not have any smokeless tobacco history on file. He reports that he drinks about 1.2 oz of alcohol per week. He reports that he does not use  illicit drugs.  ROS: UROLOGY Frequent Urination?: No Hard to postpone urination?: No Burning/pain with urination?: No Get up at night to urinate?: No Leakage of urine?: No Urine stream starts and stops?: No Trouble starting stream?: No Do you have to strain to urinate?: No Blood in urine?: No Urinary tract infection?: No Sexually transmitted disease?: No Injury to kidneys or bladder?: No Painful intercourse?: No Weak stream?: No Erection problems?: No Penile pain?: No  Gastrointestinal Nausea?: No Vomiting?: No Indigestion/heartburn?: No Diarrhea?: No Constipation?: Yes  Constitutional Fever: No Night sweats?: No Weight loss?: No Fatigue?: No  Skin Skin rash/lesions?: No Itching?: No  Eyes Blurred vision?: No Double vision?: No  Ears/Nose/Throat Sore throat?: No Sinus problems?: No  Hematologic/Lymphatic Swollen glands?: No Easy bruising?: No  Cardiovascular Leg swelling?: Yes Chest pain?: No  Respiratory Cough?: No Shortness of breath?: No  Endocrine Excessive thirst?: No  Musculoskeletal Back pain?: No Joint pain?: No  Neurological Headaches?: No Dizziness?: No  Psychologic Depression?: No Anxiety?: No  Physical Exam: BP 115/78 mmHg  Pulse 96  Ht 5\' 8"  (1.727 m)  Wt 99.474 kg (219 lb 4.8 oz)  BMI 33.35 kg/m2  Constitutional:  Alert and  oriented, No acute distress. HEENT: [place] AT, moist mucus membranes.  Trachea midline, no masses. Cardiovascular: No clubbing, cyanosis, or edema. Respiratory: Normal respiratory effort, no increased work of breathing. GI: Abdomen is soft, nontender, nondistended, no abdominal masses GU: No CVA tenderness. 1.5cm firm right upper testis mass Skin: No rashes, bruises or suspicious lesions. Lymph: No cervical or inguinal adenopathy. Neurologic: Grossly intact, no focal deficits, moving all 4 extremities. Psychiatric: Normal mood and affect.  Laboratory Data: Lab Results  Component Value Date   WBC  7.9 04/30/2015   HGB 15.6 04/30/2015   HCT 45.5 04/30/2015   MCV 91.1 04/30/2015   PLT 214 04/30/2015    Lab Results  Component Value Date   CREATININE 0.73* 03/25/2015    No results found for: PSA  No results found for: TESTOSTERONE  Lab Results  Component Value Date   HGBA1C 6.0 03/24/2015    Urinalysis    Component Value Date/Time   COLORURINE Yellow 05/19/2013 0254   APPEARANCEUR Clear 05/19/2013 0254   LABSPEC 1.025 05/19/2013 0254   PHURINE 6.0 05/19/2013 0254   GLUCOSEU Negative 05/19/2013 0254   HGBUR Negative 05/19/2013 0254   BILIRUBINUR Negative 05/19/2013 0254   KETONESUR Negative 05/19/2013 0254   PROTEINUR Negative 05/19/2013 0254   NITRITE Negative 05/19/2013 0254   LEUKOCYTESUR Negative 05/19/2013 0254    Pertinent Imaging: Scrotal US  Assessment & Plan:    1. Testicular mass - schedule for R radical orchiectomy - Urinalysis, Complete   No Follow-up on file.  Cleon Gustin, [facility] [address] [place], [place] [address] [phone]

## 2015-06-12 ENCOUNTER — Encounter: Payer: Self-pay | Admitting: *Deleted

## 2015-06-12 ENCOUNTER — Other Ambulatory Visit: Payer: Managed Care, Other (non HMO)

## 2015-06-12 NOTE — Patient Instructions (Addendum)
  Your procedure is scheduled on: 06-25-15 [facility]) Report to [place] To find out your arrival time please call [phone] between 1PM - 3PM on 06-24-15 (THURSDAY)  Remember: Instructions that are not followed completely may result in serious medical risk, up to and including death, or upon the discretion of your surgeon and anesthesiologist your surgery may need to be rescheduled.    _X___ 1. Do not eat food or drink liquids after midnight. No gum chewing or hard candies.     _X___ 2. No Alcohol for 24 hours before or after surgery.   ____ 3. Bring all medications with you on the day of surgery if instructed.    _X___ 4. Notify your doctor if there is any change in your medical condition     (cold, fever, infections).     Do not wear jewelry, make-up, hairpins, clips or nail polish.  Do not wear lotions, powders, or perfumes. You may wear deodorant.  Do not shave 48 hours prior to surgery. Men may shave face and neck.  Do not bring valuables to the hospital.    [facility] is not responsible for any belongings or valuables.               Contacts, dentures or bridgework may not be worn into surgery.  Leave your suitcase in the car. After surgery it may be brought to your room.  For patients admitted to the hospital, discharge time is determined by your treatment team.   Patients discharged the day of surgery will not be allowed to drive home.   Please read over the following fact sheets that you were given:     ____ Take these medicines the morning of surgery with A SIP OF WATER:    1. NONE  2.   3.   4.  5.  6.  ____ Fleet Enema (as directed)   _X___ Use CHG Soap as directed  ____ Use inhalers on the day of surgery  ____ Stop metformin 2 days prior to surgery    ____ Take 1/2 of usual insulin dose the night before surgery and none on the morning of surgery.   ____ Stop Coumadin/Plavix/aspirin-N/A  _X___ Stop  Anti-inflammatories-STOP NAPROXEN 7 DAYS PRIOR TO SURGERY-NO NSAIDS OR ASPIRIN PRODUCTS-TRAMADOL OK TO CONTINUE   _X___ Stop supplements until after surgery-STOP OMEGA XL 7 DAYS PRIOR TO SURGERY   ____ Bring C-Pap to the hospital.

## 2015-06-16 ENCOUNTER — Encounter
Admission: RE | Admit: 2015-06-16 | Discharge: 2015-06-16 | Disposition: A | Discharge status: Home / Self Care | Payer: Managed Care, Other (non HMO) | Source: Ambulatory Visit | Attending: Urology | Admitting: Urology

## 2015-06-16 DIAGNOSIS — Z01812 Encounter for preprocedural laboratory examination: Secondary | ICD-10-CM | POA: Insufficient documentation

## 2015-06-16 LAB — BASIC METABOLIC PANEL
ANION GAP: 12 (ref 5–15)
BUN: 10 mg/dL (ref 6–20)
CHLORIDE: 108 mmol/L (ref 101–111)
CO2: 21 mmol/L — AB (ref 22–32)
Calcium: 8.7 mg/dL — ABNORMAL LOW (ref 8.9–10.3)
Creatinine, Ser: 0.85 mg/dL (ref 0.61–1.24)
Glucose, Bld: 100 mg/dL — ABNORMAL HIGH (ref 65–99)
POTASSIUM: 3.8 mmol/L (ref 3.5–5.1)
Sodium: 141 mmol/L (ref 135–145)

## 2015-06-17 ENCOUNTER — Inpatient Hospital Stay: Admission: RE | Admit: 2015-06-17 | Payer: Managed Care, Other (non HMO) | Source: Ambulatory Visit

## 2015-06-25 ENCOUNTER — Ambulatory Visit: Payer: Managed Care, Other (non HMO) | Admitting: Anesthesiology

## 2015-06-25 ENCOUNTER — Telehealth: Payer: Self-pay

## 2015-06-25 ENCOUNTER — Ambulatory Visit
Admission: RE | Admit: 2015-06-25 | Discharge: 2015-06-25 | Disposition: A | Discharge status: Home / Self Care | Payer: Managed Care, Other (non HMO) | Source: Ambulatory Visit | Attending: Urology | Admitting: Urology

## 2015-06-25 ENCOUNTER — Encounter
Admission: RE | Disposition: A | Discharge status: Home / Self Care | Payer: Self-pay | Source: Ambulatory Visit | Attending: Urology

## 2015-06-25 ENCOUNTER — Encounter: Payer: Self-pay | Admitting: *Deleted

## 2015-06-25 DIAGNOSIS — N501 Vascular disorders of male genital organs: Secondary | ICD-10-CM | POA: Diagnosis not present

## 2015-06-25 DIAGNOSIS — Z79899 Other long term (current) drug therapy: Secondary | ICD-10-CM | POA: Insufficient documentation

## 2015-06-25 DIAGNOSIS — Z6832 Body mass index (BMI) 32.0-32.9, adult: Secondary | ICD-10-CM | POA: Diagnosis not present

## 2015-06-25 DIAGNOSIS — F17210 Nicotine dependence, cigarettes, uncomplicated: Secondary | ICD-10-CM | POA: Diagnosis not present

## 2015-06-25 DIAGNOSIS — E669 Obesity, unspecified: Secondary | ICD-10-CM | POA: Diagnosis not present

## 2015-06-25 DIAGNOSIS — E119 Type 2 diabetes mellitus without complications: Secondary | ICD-10-CM | POA: Diagnosis not present

## 2015-06-25 DIAGNOSIS — N5089 Other specified disorders of the male genital organs: Secondary | ICD-10-CM | POA: Diagnosis present

## 2015-06-25 DIAGNOSIS — D4011 Neoplasm of uncertain behavior of right testis: Secondary | ICD-10-CM | POA: Diagnosis not present

## 2015-06-25 HISTORY — DX: Unspecified osteoarthritis, unspecified site: M19.90

## 2015-06-25 HISTORY — DX: Chronic kidney disease, unspecified: N18.9

## 2015-06-25 HISTORY — PX: ORCHIECTOMY: SHX2116

## 2015-06-25 HISTORY — DX: Gastritis, unspecified, without bleeding: K29.70

## 2015-06-25 HISTORY — DX: Acute embolism and thrombosis of unspecified deep veins of unspecified lower extremity: I82.409

## 2015-06-25 LAB — GLUCOSE, CAPILLARY
GLUCOSE-CAPILLARY: 116 mg/dL — AB (ref 65–99)
Glucose-Capillary: 124 mg/dL — ABNORMAL HIGH (ref 65–99)

## 2015-06-25 SURGERY — ORCHIECTOMY
Anesthesia: General | Laterality: Right | Wound class: Clean

## 2015-06-25 MED ORDER — FENTANYL CITRATE (PF) 100 MCG/2ML IJ SOLN
INTRAMUSCULAR | Status: AC
  Administered 2015-06-25: 25 ug via INTRAVENOUS
  Filled 2015-06-25: qty 2

## 2015-06-25 MED ORDER — FAMOTIDINE 20 MG PO TABS
ORAL_TABLET | ORAL | Status: AC
  Administered 2015-06-25: 20 mg via ORAL
  Filled 2015-06-25: qty 1

## 2015-06-25 MED ORDER — ONDANSETRON HCL 4 MG/2ML IJ SOLN
INTRAMUSCULAR | Status: DC | PRN
  Administered 2015-06-25: 4 mg via INTRAVENOUS

## 2015-06-25 MED ORDER — PROPOFOL 10 MG/ML IV BOLUS
INTRAVENOUS | Status: DC | PRN
  Administered 2015-06-25: 200 mg via INTRAVENOUS

## 2015-06-25 MED ORDER — FENTANYL CITRATE (PF) 100 MCG/2ML IJ SOLN
25.0000 ug | INTRAMUSCULAR | Status: DC | PRN
  Administered 2015-06-25 (×3): 25 ug via INTRAVENOUS

## 2015-06-25 MED ORDER — MIDAZOLAM HCL 2 MG/2ML IJ SOLN
INTRAMUSCULAR | Status: DC | PRN
  Administered 2015-06-25: 2 mg via INTRAVENOUS

## 2015-06-25 MED ORDER — ACETAMINOPHEN 10 MG/ML IV SOLN
INTRAVENOUS | Status: DC | PRN
  Administered 2015-06-25: 1000 mg via INTRAVENOUS

## 2015-06-25 MED ORDER — HYDROCODONE-ACETAMINOPHEN 5-325 MG PO TABS: 1.0000 | ORAL_TABLET | Freq: Four times a day (QID) | ORAL | Status: DC | PRN

## 2015-06-25 MED ORDER — BUPIVACAINE HCL 0.5 % IJ SOLN
INTRAMUSCULAR | Status: DC | PRN
  Administered 2015-06-25: 5 mL

## 2015-06-25 MED ORDER — ACETAMINOPHEN 10 MG/ML IV SOLN
INTRAVENOUS | Status: AC
  Filled 2015-06-25: qty 100

## 2015-06-25 MED ORDER — CEFAZOLIN SODIUM-DEXTROSE 2-3 GM-% IV SOLR
2.0000 g | Freq: Once | INTRAVENOUS | Status: AC
  Administered 2015-06-25 (×2): 2 g via INTRAVENOUS

## 2015-06-25 MED ORDER — CEFAZOLIN SODIUM-DEXTROSE 2-3 GM-% IV SOLR
INTRAVENOUS | Status: AC
  Administered 2015-06-25: 2 g via INTRAVENOUS
  Filled 2015-06-25: qty 50

## 2015-06-25 MED ORDER — FENTANYL CITRATE (PF) 100 MCG/2ML IJ SOLN
INTRAMUSCULAR | Status: DC | PRN
  Administered 2015-06-25 (×2): 50 ug via INTRAVENOUS

## 2015-06-25 MED ORDER — CEPHALEXIN 500 MG PO CAPS: 500.0000 mg | ORAL_CAPSULE | Freq: Three times a day (TID) | ORAL | Status: DC

## 2015-06-25 MED ORDER — PROMETHAZINE HCL 25 MG/ML IJ SOLN: 6.2500 mg | INTRAMUSCULAR | Status: DC | PRN

## 2015-06-25 MED ORDER — SODIUM CHLORIDE 0.9 % IV SOLN
INTRAVENOUS | Status: DC
  Administered 2015-06-25: 12:00:00 via INTRAVENOUS

## 2015-06-25 MED ORDER — LIDOCAINE HCL (CARDIAC) 20 MG/ML IV SOLN
INTRAVENOUS | Status: DC | PRN
  Administered 2015-06-25: 80 mg via INTRAVENOUS

## 2015-06-25 MED ORDER — FAMOTIDINE 20 MG PO TABS
20.0000 mg | ORAL_TABLET | Freq: Once | ORAL | Status: AC
  Administered 2015-06-25: 20 mg via ORAL

## 2015-06-25 SURGICAL SUPPLY — 46 items
APPLIER CLIP 55.6 M/L HEMOLOK (INSTRUMENTS) IMPLANT
BLADE SURG 15 STRL LF DISP TIS (BLADE) ×1 IMPLANT
BLADE SURG 15 STRL SS (BLADE) ×1
CANISTER SUCT 1200ML W/VALVE (MISCELLANEOUS) ×2 IMPLANT
CHLORAPREP W/TINT 26ML (MISCELLANEOUS) ×2 IMPLANT
CLIP TI MEDIUM 6 (CLIP) IMPLANT
CNTNR SPEC 2.5X3XGRAD LEK (MISCELLANEOUS)
CONT SPEC 4OZ STER OR WHT (MISCELLANEOUS)
CONTAINER SPEC 2.5X3XGRAD LEK (MISCELLANEOUS) IMPLANT
DRAIN PENROSE 5/8X12 LTX STRL (DRAIN) IMPLANT
DRAPE LAPAROTOMY 77X122 PED (DRAPES) ×2 IMPLANT
DRESSING TELFA 4X3 1S ST N-ADH (GAUZE/BANDAGES/DRESSINGS) ×2 IMPLANT
DRSG TEGADERM 4X4.75 (GAUZE/BANDAGES/DRESSINGS) ×2 IMPLANT
ELECT REM PT RETURN 9FT ADLT (ELECTROSURGICAL) ×2
ELECTRODE REM PT RTRN 9FT ADLT (ELECTROSURGICAL) ×1 IMPLANT
GAUZE FLUFF 18X24 1PLY STRL (GAUZE/BANDAGES/DRESSINGS) ×2 IMPLANT
GLOVE BIO SURGEON STRL SZ 6.5 (GLOVE) ×2 IMPLANT
GLOVE BIOGEL PI IND STRL 6.5 (GLOVE) ×1 IMPLANT
GLOVE BIOGEL PI INDICATOR 6.5 (GLOVE) ×1
GOWN STRL REUS W/ TWL LRG LVL3 (GOWN DISPOSABLE) ×2 IMPLANT
GOWN STRL REUS W/TWL LRG LVL3 (GOWN DISPOSABLE) ×2
KIT RM TURNOVER STRD PROC AR (KITS) ×2 IMPLANT
LABEL OR SOLS (LABEL) ×2 IMPLANT
LIQUID BAND (GAUZE/BANDAGES/DRESSINGS) ×2 IMPLANT
NEEDLE FILTER BLUNT 18X 1/2SAF (NEEDLE) ×1
NEEDLE FILTER BLUNT 18X1 1/2 (NEEDLE) ×1 IMPLANT
PACK BASIN MINOR ARMC (MISCELLANEOUS) ×2 IMPLANT
PREP PVP WINGED SPONGE (MISCELLANEOUS) IMPLANT
SPONGE KITTNER 5P (MISCELLANEOUS) ×2 IMPLANT
STRIP CLOSURE SKIN 1/2X4 (GAUZE/BANDAGES/DRESSINGS) ×2 IMPLANT
SUPPORETR ATHLETIC LG (MISCELLANEOUS) ×1 IMPLANT
SUPPORTER ATHLETIC LG (MISCELLANEOUS) ×2
SUT CHROMIC GUT BROWN 0 54 (SUTURE) ×1 IMPLANT
SUT CHROMIC GUT BROWN 0 54IN (SUTURE) ×2
SUT MNCRL 4-0 (SUTURE) ×1
SUT MNCRL 4-0 27XMFL (SUTURE) ×1
SUT SILK 0 CT 1 30 (SUTURE) ×4 IMPLANT
SUT SILK 0 SH 30 (SUTURE) ×2 IMPLANT
SUT SILK 2 0 SH (SUTURE) ×8 IMPLANT
SUT VIC AB 3-0 SH 27 (SUTURE) ×1
SUT VIC AB 3-0 SH 27X BRD (SUTURE) ×1 IMPLANT
SUT VIC AB 4-0 FS2 27 (SUTURE) ×2 IMPLANT
SUTURE MNCRL 4-0 27XMF (SUTURE) ×1 IMPLANT
SYR 20CC LL (SYRINGE) IMPLANT
SYRINGE 10CC LL (SYRINGE) ×2 IMPLANT
WATER STERILE IRR 1000ML POUR (IV SOLUTION) ×2 IMPLANT

## 2015-06-25 NOTE — H&P (Addendum)
TYLE DIAB [birth year] [id number]  Referring provider: Arlis Porta., MD [place], [place] [address]  Chief Complaint  Patient presents with  . Follow-up    testicular mass, diagnostic image     HPI: Craig Bruce is a 60yo seen today for right testis mass. He took 30 days of abx and the pain has resolved. US shows a persistent mass with blood flow in the upper right testis. LDH was mildly elevated but tumor markers were normal.    PMH: Past Medical History  Diagnosis Date  . Diabetes mellitus type 2, controlled, without complications ([place]) [id number]  . Obesity (BMI 30.0-34.9) 02/17/2015  . Smoker 02/17/2015  . Annual physical exam 03/24/2015  . Need for influenza vaccination 03/24/2015  . Immunization due 03/24/2015    Surgical History: Past Surgical History  Procedure Laterality Date  . No past surgeries      Home Medications:    Medication List       This list is accurate as of: 05/22/15 9:11 AM. Always use your most recent med list.              naproxen 500 MG tablet  Commonly known as: NAPROSYN  Take 1 tablet (500 mg total) by mouth 2 (two) times daily with a meal.     OVER THE COUNTER MEDICATION  Omega XL     sulfamethoxazole-trimethoprim 800-160 MG tablet  Commonly known as: BACTRIM DS,SEPTRA DS  Take 1 tablet by mouth 2 (two) times daily.     traMADol 50 MG tablet  Commonly known as: ULTRAM  Take 1 tablet (50 mg total) by mouth every 8 (eight) hours as needed.        Allergies: No Known Allergies  Family History: Family History  Problem Relation Age of Onset  . Prostate cancer Neg Hx   . Kidney cancer Neg Hx   . Kidney failure Neg Hx     Social History:  reports that he has been smoking Cigarettes. He has been smoking about 0.50 packs per day. He does not have any smokeless tobacco history on file. He reports that he drinks about 1.2  oz of alcohol per week. He reports that he does not use illicit drugs.  ROS: UROLOGY Frequent Urination?: No Hard to postpone urination?: No Burning/pain with urination?: No Get up at night to urinate?: No Leakage of urine?: No Urine stream starts and stops?: No Trouble starting stream?: No Do you have to strain to urinate?: No Blood in urine?: No Urinary tract infection?: No Sexually transmitted disease?: No Injury to kidneys or bladder?: No Painful intercourse?: No Weak stream?: No Erection problems?: No Penile pain?: No  Gastrointestinal Nausea?: No Vomiting?: No Indigestion/heartburn?: No Diarrhea?: No Constipation?: Yes  Constitutional Fever: No Night sweats?: No Weight loss?: No Fatigue?: No  Skin Skin rash/lesions?: No Itching?: No  Eyes Blurred vision?: No Double vision?: No  Ears/Nose/Throat Sore throat?: No Sinus problems?: No  Hematologic/Lymphatic Swollen glands?: No Easy bruising?: No  Cardiovascular Leg swelling?: Yes Chest pain?: No  Respiratory Cough?: No Shortness of breath?: No  Endocrine Excessive thirst?: No  Musculoskeletal Back pain?: No Joint pain?: No  Neurological Headaches?: No Dizziness?: No  Psychologic Depression?: No Anxiety?: No  Physical Exam: BP 115/78 mmHg  Pulse 96  Ht 5\' 8"  (1.727 m)  Wt 99.474 kg (219 lb 4.8 oz)  BMI 33.35 kg/m2  Constitutional: Alert and oriented, No acute distress. HEENT: [place] AT, moist mucus membranes. Trachea midline, no masses.  Cardiovascular: No clubbing, cyanosis, or edema. RRR. Respiratory: Normal respiratory effort, no increased work of breathing. GI: Abdomen is soft, nontender, nondistended, no abdominal masses GU: No CVA tenderness. 1.5cm firm right upper testis mass Skin: No rashes, bruises or suspicious lesions. Lymph: No cervical or inguinal adenopathy. Neurologic: Grossly intact, no focal deficits, moving all 4 extremities. Psychiatric: Normal mood and  affect.  Laboratory Data:  Recent Labs    Lab Results  Component Value Date   WBC 7.9 04/30/2015   HGB 15.6 04/30/2015   HCT 45.5 04/30/2015   MCV 91.1 04/30/2015   PLT 214 04/30/2015       Recent Labs    Lab Results  Component Value Date   CREATININE 0.73* 03/25/2015       Recent Labs    No results found for: PSA     Recent Labs    No results found for: TESTOSTERONE     Recent Labs    Lab Results  Component Value Date   HGBA1C 6.0 03/24/2015      Urinalysis  Labs (Brief)       Component Value Date/Time   COLORURINE Yellow 05/19/2013 0254   APPEARANCEUR Clear 05/19/2013 0254   LABSPEC 1.025 05/19/2013 0254   PHURINE 6.0 05/19/2013 0254   GLUCOSEU Negative 05/19/2013 0254   HGBUR Negative 05/19/2013 0254   BILIRUBINUR Negative 05/19/2013 0254   KETONESUR Negative 05/19/2013 0254   PROTEINUR Negative 05/19/2013 0254   NITRITE Negative 05/19/2013 0254   LEUKOCYTESUR Negative 05/19/2013 0254      Pertinent Imaging: Scrotal US  Assessment & Plan:   1. Testicular mass - schedule for R radical orchiectomy - Urinalysis, Complete

## 2015-06-25 NOTE — Discharge Instructions (Signed)

## 2015-06-25 NOTE — Anesthesia Preprocedure Evaluation (Signed)
Anesthesia Evaluation  Patient identified by MRN, date of birth, ID band Patient awake    Reviewed: Allergy & Precautions, H&P , NPO status , Patient's Chart, lab work & pertinent test results, reviewed documented beta blocker date and time   History of Anesthesia Complications Negative for: history of anesthetic complications  Airway Mallampati: IV  TM Distance: >3 FB Neck ROM: full    Dental no notable dental hx. (+) Missing, Chipped, Poor Dentition   Pulmonary neg shortness of breath, neg sleep apnea, neg COPD, Recent URI , Residual Cough, Current Smoker,    Pulmonary exam normal breath sounds clear to auscultation       Cardiovascular Exercise Tolerance: Good negative cardio ROS Normal cardiovascular exam Rhythm:regular Rate:Normal     Neuro/Psych negative neurological ROS  negative psych ROS   GI/Hepatic negative GI ROS, Neg liver ROS,   Endo/Other  diabetes, Poorly ControlledMorbid obesity  Renal/GU Renal disease (kidney stones)  negative genitourinary   Musculoskeletal   Abdominal   Peds  Hematology negative hematology ROS (+)   Anesthesia Other Findings Past Medical History:   Diabetes mellitus type 2, controlled, without * 02/17/2015    Obesity (BMI 30.0-34.9)                         02/17/2015    Smoker                                          02/17/2015    Annual physical exam                            03/24/2015   Need for influenza vaccination                  03/24/2015   Immunization due                                03/24/2015   Chronic kidney disease                                         Comment:H/O KIDNEY STONES   Gastritis                                                    Arthritis                                                      Comment:BOTH HANDS   Cancer ([place])                                                 DVT (deep venous thrombosis) ([place])              1996  Reproductive/Obstetrics negative OB ROS                             Anesthesia Physical Anesthesia Plan  ASA: III  Anesthesia Plan: General   Post-op Pain Management:    Induction:   Airway Management Planned:   Additional Equipment:   Intra-op Plan:   Post-operative Plan:   Informed Consent: I have reviewed the patients History and Physical, chart, labs and discussed the procedure including the risks, benefits and alternatives for the proposed anesthesia with the patient or authorized representative who has indicated his/her understanding and acceptance.   Dental Advisory Given  Plan Discussed with: Anesthesiologist, CRNA and Surgeon  Anesthesia Plan Comments:         Anesthesia Quick Evaluation

## 2015-06-25 NOTE — Transfer of Care (Signed)
Immediate Anesthesia Transfer of Care Note  Patient: Craig Bruce  Procedure(s) Performed: Procedure(s): ORCHIECTOMY (Right)  Patient Location: PACU  Anesthesia Type:General  Level of Consciousness: sedated  Airway & Oxygen Therapy: Patient Spontanous Breathing and Patient connected to face mask oxygen  Post-op Assessment: Report given to RN and Post -op Vital signs reviewed and stable  Post vital signs: Reviewed and stable  Last Vitals: 1434 - 97% sat 88 hr 16 resp 104/69 bp 97.8 temp  Filed Vitals:   06/25/15 1106  BP: 141/89  Pulse: 89  Temp: 37.1 C  Resp: 18    Complications: No apparent anesthesia complications

## 2015-06-25 NOTE — Anesthesia Procedure Notes (Signed)
Procedure Name: LMA Insertion Date/Time: 06/25/2015 1:48 PM Performed by: Johnna Acosta Pre-anesthesia Checklist: Patient identified, Emergency Drugs available, Suction available, Patient being monitored and Timeout performed Patient Re-evaluated:Patient Re-evaluated prior to inductionOxygen Delivery Method: Circle system utilized Preoxygenation: Pre-oxygenation with 100% oxygen Intubation Type: IV induction LMA: LMA inserted LMA Size: 4.5 Tube type: Oral Number of attempts: 1 Placement Confirmation: positive ETCO2 and breath sounds checked- equal and bilateral Tube secured with: Tape

## 2015-06-25 NOTE — Telephone Encounter (Signed)
-----   Message from Nickie Retort, MD sent at 06/25/2015  2:45 PM EST ----- Patient needs to f/u in 1-2 weeks for pathology results.

## 2015-06-25 NOTE — Op Note (Signed)
Date of procedure: 06/25/2015  Preoperative diagnosis:  1. Right testicular mass   Postoperative diagnosis:  1. Right testicular mass   Procedure: 1. Right radical orchiectomy  Surgeon: Baruch Gouty, MD  Anesthesia: General  Complications: None  Intraoperative findings: Right upper pole testicular lesion  EBL: Minimal  Specimens: Right testicle to pathology  Drains: None  Disposition: Stable to the postanesthesia care unit  Indication for procedure: The patient is a 60 y.o. male with 1.3 cm right testicular lesion that is concerning for malignancy. He presents today for radical orchiectomy.  After reviewing the management options for treatment, the patient elected to proceed with the above surgical procedure(s). We have discussed the potential benefits and risks of the procedure, side effects of the proposed treatment, the likelihood of the patient achieving the goals of the procedure, and any potential problems that might occur during the procedure or recuperation. Informed consent has been obtained.  Description of procedure: The patient was met in the preoperative area. All risks, benefits, and indications of the procedure were described in great detail. The patient consented to the procedure. Preoperative antibiotics were given. The patient was taken to the operative theater. General anesthesia was induced per the anesthesia service. The patient was placed in the supine position and prepped and draped in the usual sterile fashion. A timeout was called. An incision was made over the patient's right inguinal canal was approximately 5 cm in size. Dr. Eulas Post was used to dissect through subcutaneous tissues until the spermatic cord was encountered. It was free from its surrounding tissue. It is immediately clamped. The right testicle was delivered from the right hemiscrotum and the gubernaculum incised with electrocautery. The right spermatic cord was then mobilized level of the  external iliac ring. It was clamped again this point. Between the 2 clamps was incised and sent to pathology. It 0 silk stick tie was then used proximal to the clamp for hemostasis. Proximal to this a 0 silk free suture was then tied to ensure hemostasis. The length of this suture was left at approximately 5 cm in the event that the patient needs RPLND in the future. Subcutaneous tissue was then closed with 3-0 Vicryl. Subcutaneous closure with 4-0 Vicryl then took place in a running fashion. Incision was closed with skin glue. A scrotal support was placed. The patient was woken from anesthesia and transferred in stable condition to postanesthesia care unit without any apparent complication.  Plan:  Follow-up in one to 2 weeks for pathology results.  Baruch Gouty, M.D.

## 2015-06-25 NOTE — Telephone Encounter (Signed)
done

## 2015-06-26 NOTE — Anesthesia Postprocedure Evaluation (Signed)
Anesthesia Post Note  Patient: Craig Bruce  Procedure(s) Performed: Procedure(s) (LRB): ORCHIECTOMY (Right)  Patient location during evaluation: PACU Anesthesia Type: General Level of consciousness: awake and alert Pain management: pain level controlled Vital Signs Assessment: post-procedure vital signs reviewed and stable Respiratory status: spontaneous breathing, nonlabored ventilation, respiratory function stable and patient connected to nasal cannula oxygen Cardiovascular status: blood pressure returned to baseline and stable Postop Assessment: no signs of nausea or vomiting Anesthetic complications: no    Last Vitals:  Filed Vitals:   06/25/15 1528 06/25/15 1558  BP: 163/98 146/90  Pulse: 84 85  Temp: 36.2 C   Resp: 16     Last Pain:  Filed Vitals:   06/25/15 1601  PainSc: 2                  Martha Clan

## 2015-06-27 LAB — SURGICAL PATHOLOGY

## 2015-07-11 ENCOUNTER — Ambulatory Visit (INDEPENDENT_AMBULATORY_CARE_PROVIDER_SITE_OTHER): Payer: Managed Care, Other (non HMO) | Admitting: Urology

## 2015-07-11 ENCOUNTER — Encounter: Payer: Self-pay | Admitting: Urology

## 2015-07-11 VITALS — BP 118/74 | HR 113 | Ht 67.0 in | Wt 222.8 lb

## 2015-07-11 DIAGNOSIS — N50819 Testicular pain, unspecified: Secondary | ICD-10-CM

## 2015-07-11 NOTE — Progress Notes (Signed)
07/11/2015 2:03 PM   Craig Bruce 1955-12-30 [id number]  Referring provider: Arlis Porta., MD [address] [place], [place] [address]  Chief Complaint  Patient presents with  . Results    HPI: Craig Bruce is a 60yo seen today for right testis mass s/p right radical orchiectomy. He took 30 days of abx and the pain has resolved. US shows a persistent mass with blood flow in the upper right testis. LDH was mildly elevated but tumor markers were normal.   The patient reports an well postoperative. His right chronic testicular pain has resolved. His mild tenderness at the incision site of drainage. He feels otherwise quite well.  Pathology:  TESTICLE, RIGHT; RADICAL ORCHIECTOMY:  - SEGMENTAL ISCHEMIC DAMAGE / INFARCT.  - A NEOPLASTIC PROCESS IS NOT IDENTIFIED.   PMH: Past Medical History  Diagnosis Date  . Diabetes mellitus type 2, controlled, without complications ([place]) [id number]  . Obesity (BMI 30.0-34.9) 02/17/2015  . Smoker 02/17/2015  . Annual physical exam 03/24/2015  . Need for influenza vaccination 03/24/2015  . Immunization due 03/24/2015  . Chronic kidney disease     H/O KIDNEY STONES  . Gastritis   . Arthritis     BOTH HANDS  . Cancer ([place])   . DVT (deep venous thrombosis) ([place]) 1996    Surgical History: Past Surgical History  Procedure Laterality Date  . Wisdom tooth extraction    . Orchiectomy Right 06/25/2015    Procedure: ORCHIECTOMY;  Surgeon: Nickie Retort, MD;  Location: [facility] ORS;  Service: Urology;  Laterality: Right;    Home Medications:    Medication List       This list is accurate as of: 07/11/15  2:03 PM.  Always use your most recent med list.               cephALEXin 500 MG capsule  Commonly known as:  KEFLEX  Take 1 capsule (500 mg total) by mouth 3 (three) times daily.     HYDROcodone-acetaminophen 5-325 MG tablet  Commonly known as:  NORCO  Take 1 tablet by mouth every 6 (six) hours as needed for moderate pain.     naproxen 500 MG tablet  Commonly known as:  NAPROSYN  Take 1 tablet (500 mg total) by mouth 2 (two) times daily with a meal.     OVER THE COUNTER MEDICATION  Omega XL        Allergies:  Allergies  Allergen Reactions  . Other     BLOOD THINNER USED 20 YEARS AGO-PT UNSURE EXACTLY OF NAME-CAUSED FEVERS OF 104 PER PT    Family History: Family History  Problem Relation Age of Onset  . Prostate cancer Neg Hx   . Kidney cancer Neg Hx   . Kidney failure Neg Hx     Social History:  reports that he has been smoking Cigarettes.  He has a 20 pack-year smoking history. He does not have any smokeless tobacco history on file. He reports that he drinks about 1.2 oz of alcohol per week. He reports that he does not use illicit drugs.  ROS: UROLOGY Frequent Urination?: Yes Hard to postpone urination?: No Burning/pain with urination?: No Get up at night to urinate?: Yes Leakage of urine?: Yes Urine stream starts and stops?: No Trouble starting stream?: No Do you have to strain to urinate?: No Blood in urine?: No Urinary tract infection?: No Sexually transmitted disease?: No Injury to kidneys or bladder?: No Painful intercourse?: No Weak stream?: No  Erection problems?: No Penile pain?: No  Gastrointestinal Nausea?: No Vomiting?: No Indigestion/heartburn?: No Diarrhea?: No Constipation?: Yes  Constitutional Fever: No Night sweats?: No Weight loss?: No Fatigue?: No  Skin Skin rash/lesions?: No Itching?: No  Eyes Blurred vision?: No Double vision?: No  Ears/Nose/Throat Sore throat?: No Sinus problems?: No  Hematologic/Lymphatic Swollen glands?: No Easy bruising?: No  Cardiovascular Leg swelling?: No Chest pain?: No  Respiratory Cough?: No Shortness of breath?: No  Endocrine Excessive thirst?: No  Musculoskeletal Back pain?: No Joint pain?: No  Neurological Headaches?: No Dizziness?: No  Psychologic Depression?: No Anxiety?: No  Physical  Exam: BP 118/74 mmHg  Pulse 113  Ht 5\' 7"  (1.702 m)  Wt 222 lb 12.8 oz (101.061 kg)  BMI 34.89 kg/m2  Constitutional:  Alert and oriented, No acute distress. HEENT: [place] AT, moist mucus membranes.  Trachea midline, no masses. Cardiovascular: No clubbing, cyanosis, or edema. Respiratory: Normal respiratory effort, no increased work of breathing. GI: Abdomen is soft, nontender, nondistended, no abdominal masses GU: No CVA tenderness.  Skin: No rashes, bruises or suspicious lesions. Lymph: No cervical or inguinal adenopathy. Neurologic: Grossly intact, no focal deficits, moving all 4 extremities. Psychiatric: Normal mood and affect.  Laboratory Data: Lab Results  Component Value Date   WBC 7.9 04/30/2015   HGB 15.6 04/30/2015   HCT 45.5 04/30/2015   MCV 91.1 04/30/2015   PLT 214 04/30/2015    Lab Results  Component Value Date   CREATININE 0.85 06/16/2015    No results found for: PSA  No results found for: TESTOSTERONE  Lab Results  Component Value Date   HGBA1C 6.0 03/24/2015    Urinalysis    Component Value Date/Time   COLORURINE Yellow 05/19/2013 0254   APPEARANCEUR Clear 05/19/2013 0254   LABSPEC 1.025 05/19/2013 0254   PHURINE 6.0 05/19/2013 0254   GLUCOSEU Negative 05/22/2015 0817   GLUCOSEU Negative 05/19/2013 0254   HGBUR Negative 05/19/2013 0254   BILIRUBINUR Negative 05/22/2015 0817   BILIRUBINUR Negative 05/19/2013 0254   KETONESUR Negative 05/19/2013 0254   PROTEINUR Negative 05/19/2013 0254   NITRITE Negative 05/22/2015 0817   NITRITE Negative 05/19/2013 0254   LEUKOCYTESUR Negative 05/22/2015 0817   LEUKOCYTESUR Negative 05/19/2013 0254     Assessment & Plan:    1. Testicular pain/infarct s/p radical orchiectomy with no sign of malignancy The patient was made aware that he has no sign of malignancy. He will follow-up with [place] when necessary.  Return if symptoms worsen or fail to improve.  Nickie Retort, MD  [facility] [address] [place], [place] [address] [phone]

## 2016-03-05 ENCOUNTER — Encounter: Payer: Self-pay | Admitting: Family Medicine

## 2016-03-05 ENCOUNTER — Ambulatory Visit (INDEPENDENT_AMBULATORY_CARE_PROVIDER_SITE_OTHER): Payer: Managed Care, Other (non HMO) | Admitting: Family Medicine

## 2016-03-05 VITALS — BP 113/76 | HR 93 | Temp 98.7°F | Resp 16 | Ht 67.0 in | Wt 218.0 lb

## 2016-03-05 DIAGNOSIS — M79661 Pain in right lower leg: Secondary | ICD-10-CM

## 2016-03-05 DIAGNOSIS — Z86718 Personal history of other venous thrombosis and embolism: Secondary | ICD-10-CM | POA: Diagnosis not present

## 2016-03-05 DIAGNOSIS — M7631 Iliotibial band syndrome, right leg: Secondary | ICD-10-CM | POA: Diagnosis not present

## 2016-03-05 LAB — BASIC METABOLIC PANEL WITH GFR
BUN: 11 mg/dL (ref 7–25)
CALCIUM: 9.4 mg/dL (ref 8.6–10.3)
CO2: 26 mmol/L (ref 20–31)
CREATININE: 0.82 mg/dL (ref 0.70–1.25)
Chloride: 106 mmol/L (ref 98–110)
GFR, Est African American: >89 mL/min (ref >=60)
GFR, Est Non African American: >89 mL/min (ref >=60)
Glucose, Bld: 180 mg/dL — ABNORMAL HIGH (ref 65–99)
Potassium: 4.5 mmol/L (ref 3.5–5.3)
SODIUM: 139 mmol/L (ref 135–146)

## 2016-03-05 MED ORDER — MELOXICAM 15 MG PO TABS: 15.0000 mg | ORAL_TABLET | Freq: Every day | ORAL | 1 refills | Status: DC

## 2016-03-05 NOTE — Progress Notes (Signed)
Subjective:    Patient ID: Craig Bruce, male    DOB: Jun 04, 1955, 61 y.o.   MRN: [id number]  HPI: Craig Bruce is a 60 y.o. male presenting on 03/05/2016 for Leg Pain (Right side onset couple of months pt's had taken meloxicam 15 gm older sis meds)   HPI  Pt presents for L leg muscle pain. He works at [employer] on [address]. Symptoms presents x 2-3 mos. Pain is located in the upper  Lateral thigh to knee and then posterior calf. No redness or swelling. Just feels stiff and sore. Knee feels stiff and has been popping. No swelling in the knee. Has tried and aleve and ibuprofen with no relief. Does lots of lifting and heavy parts at work. When he gets home from work in the morning it is the sorest.  When he wakes from sleep there is no pain.   Past Medical History:  Diagnosis Date  . Annual physical exam 03/24/2015  . Arthritis    BOTH HANDS  . Cancer ([place])   . Chronic kidney disease    H/O KIDNEY STONES  . Diabetes mellitus type 2, controlled, without complications ([place]) [id number]  . DVT (deep venous thrombosis) ([place]) 1996  . Gastritis   . Immunization due 03/24/2015  . Need for influenza vaccination 03/24/2015  . Obesity (BMI 30.0-34.9) 02/17/2015  . Smoker 02/17/2015    No current outpatient prescriptions on file prior to visit.   No current facility-administered medications on file prior to visit.     Review of Systems  Constitutional: Negative for chills and fever.  HENT: Negative.   Respiratory: Negative for chest tightness, shortness of breath and wheezing.   Cardiovascular: Negative for chest pain, palpitations and leg swelling.  Gastrointestinal: Negative for abdominal pain, nausea and vomiting.  Endocrine: Negative.   Genitourinary: Negative for discharge, dysuria, penile pain, testicular pain and urgency.  Musculoskeletal: Positive for arthralgias and myalgias. Negative for back pain and joint swelling.  Skin: Negative.   Neurological: Negative for  dizziness, weakness, numbness and headaches.  Psychiatric/Behavioral: Negative for dysphoric mood and sleep disturbance.   Per HPI unless specifically indicated above     Objective:    BP 113/76   Pulse 93   Temp 98.7 F (37.1 C) (Oral)   Resp 16   Ht 5\' 7"  (1.702 m)   Wt 218 lb (98.9 kg)   BMI 34.14 kg/m   Wt Readings from Last 3 Encounters:  03/05/16 218 lb (98.9 kg)  07/11/15 222 lb 12.8 oz (101.1 kg)  06/25/15 210 lb (95.3 kg)    Physical Exam  Constitutional: He is oriented to person, place, and time. He appears well-developed and well-nourished. No distress.  HENT:  Head: Normocephalic and atraumatic.  Neck: Neck supple. No thyromegaly present.  Cardiovascular: Normal rate, regular rhythm and normal heart sounds.  Exam reveals no gallop and no friction rub.   No murmur heard. Pulmonary/Chest: Effort normal and breath sounds normal. He has no wheezes.  Abdominal: Soft. Bowel sounds are normal. He exhibits no distension. There is no tenderness. There is no rebound.  Musculoskeletal: Normal range of motion. He exhibits no edema.       Right knee: He exhibits normal range of motion, no swelling, no effusion, no ecchymosis, no erythema, no LCL laxity and no MCL laxity.       Right lower leg: He exhibits tenderness. He exhibits no swelling, no edema and no deformity.       Left  lower leg: He exhibits no edema.       Legs: L > R calf 2/2 h/o previous trauma and DVT  Neurological: He is alert and oriented to person, place, and time. He has normal reflexes.  Skin: Skin is warm and dry. No rash noted. No erythema.  Psychiatric: He has a normal mood and affect. His behavior is normal. Thought content normal.   Results for orders placed or performed during the hospital encounter of 06/25/15  Glucose, capillary  Result Value Ref Range   Glucose-Capillary 116 (H) 65 - 99 mg/dL  Glucose, capillary  Result Value Ref Range   Glucose-Capillary 124 (H) 65 - 99 mg/dL  Surgical  pathology  Result Value Ref Range   SURGICAL PATHOLOGY      Surgical Pathology CASE: [id number] PATIENT: Craig Bruce Surgical Pathology Report     SPECIMEN SUBMITTED: A. Testicle, right  CLINICAL HISTORY: Right testicle with ill-defined masslike area measuring 14 mm  PRE-OPERATIVE DIAGNOSIS: Right testicular mass  POST-OPERATIVE DIAGNOSIS: Same as pre-op     DIAGNOSIS: A. TESTICLE, RIGHT; RADICAL ORCHIECTOMY: - SEGMENTAL ISCHEMIC DAMAGE / INFARCT. - A NEOPLASTIC PROCESS IS NOT IDENTIFIED.   GROSS DESCRIPTION: A. Labeled: Right testicle  Type of procedure: Radical  Laterality: Right  Weight of specimen: 45 grams  Size of specimen:           Testis: 4.6 x 2.5 x 2 cm           Spermatic cord: 6.7 x 0.3 cm           Epididymis: 2.5 x 0.9 x 0.5 cm  Structures attached to testis: Epididymis, spermatic cord, tunica vaginalis  Orientation: Normal anatomical landmarks, specimen inked blue  Number of masses: 1  Size(s) of mass(es): 1.5 x 0.9 x 1.4 cm  Description of mass(es): Extremely vague pale t an slightly firm mass located in the upper testicle  Confinement to testis: Confined  Relationship of tumor to adjacent anatomic structures:      Epididymis: Does not involve      Tunica vaginalis: Does not involve      Paratesticular soft tissues: Does not involve      Spermatic cord: Does not involve      Hilar soft tissue: Does not involve      Other: Does not involve  Margins: 6.5 cm  Description of remainder of tissue: The rest of the testicular parenchyma is pale tan and soft and the seminiferous tubes string with ease. There is what appears to be an appendix testes noted adjacent to the epididymis that is a pedunculated cystic structure measuring 0.5 cm in diameter, containing clear fluid.  Block summary: 1-en face margin of spermatic cord and blood vessels 2-spermatic cord and blood vessels midsection 3-spermatic cord and blood vessels  adjacent testes 4-6- sections of mass including overlying tunica vaginalis 7-section of uninvolved testicle 8-section  of uninvolved epididymis 9-possible appendix testes     Final Diagnosis performed by Quay Burow, MD.  Electronically signed 06/27/2015 3:46:30PM    The electronic signature indicates that the named Attending Pathologist has evaluated the specimen  Technical component performed at [facility], [address] [place], [place] [address] Lab: [id number] Dir: Darrick Penna. Evette Doffing, MD  Professional component performed at [facility], [facility], [place], [place], [place] [address] Lab: [phone] Dir: Dellia Nims. Reuel Derby, MD        Assessment & Plan:   Problem List Items Addressed This Visit    None  Visit Diagnoses    It band syndrome, right    -  Primary   Symptoms seem consistent with IT band in the lateral thigh.  Trial of heat/ice, rest, and meloxicam. Alarm symptoms reviewed. Consider ortho or PT if not improving.    Relevant Medications   meloxicam (MOBIC) 15 MG tablet   Other Relevant Orders   BASIC METABOLIC PANEL WITH GFR   Right calf pain       Likely MSK in nature- however given pt personal history of DVT will r/o clot with D-dimer today and proceed to [place] if indicated. Meloxicam for pain. F/u 1 mos.    Relevant Orders   D-Dimer, Quantitative   Personal history of DVT (deep vein thrombosis)       Given pt history with r/o suspicion for clot with D-dimer.    Relevant Orders   D-Dimer, Quantitative      Meds ordered this encounter  Medications  . meloxicam (MOBIC) 15 MG tablet    Sig: Take 1 tablet (15 mg total) by mouth daily.    Dispense:  30 tablet    Refill:  1    Order Specific Question:   Supervising Provider    Answer:   Arlis Porta [id number]      Follow up plan: Return in about 4 weeks (around 04/02/2016), or if symptoms worsen or fail to improve, for Leg pain.

## 2016-03-05 NOTE — Patient Instructions (Addendum)
Iliotibial Band Syndrome  Iliotibial band syndrome is pain in the outer, lower thigh. The pain is caused by an inflammation of the iliotibial band. This is a band of thick fibrous tissue that runs down the outside of the thigh. The iliotibial band begins at the hip. It extends to the outer side of the shin bone (tibia) just below the knee joint. The band works with the thigh muscles. Together they provide stability to the outside of the knee joint.  Iliotibial band syndrome occurs when there is inflammation to this band of tissue. This is typically due to over use and not due to an injury. The irritation usually occurs over the outside of the knee joint, at the the end of the thigh bone (femur). The iliotibial band crosses bone and muscle at this point. Between these structures is a cushioning sac (bursa). The bursa should make possible a smooth gliding motion. However, when inflamed, the iliotibial band does not glide easily. When inflamed, there is pain with motion of the knee. Usually the pain worsens with continued movement and the pain goes away with rest.  This problem usually arises when there is a sudden increase in sports activities involving your legs. Running, and playing soccer or basketball are examples of activities causing this. Others who are prone to iliotibial band syndrome include individuals with mechanical problems such as leg length differences, abnormality of walking, bowed legs etc.  HOME CARE INSTRUCTIONS   · Apply ice to the injured area:    Put ice in a plastic bag.    Place a towel between your skin and the bag.    Leave the ice on for 20 minutes, 2-3 times a day.  · Limit excessive training or eliminate training until pain goes away.  · While pain is present, you may use gentle range of motion. Do not resume regular use until instructed by your health care provider. Begin use gradually. Do not increase activity to the point of pain. If pain does develop, decrease activity and continue  the above measures. Gradually increase activities that do not cause discomfort. Do this until you finally achieve normal use.  · Perform low-impact activities while pain is present. Wear proper footwear.  · Only take over-the-counter or prescription medicines for pain, discomfort, or fever as directed by your health care provider.  SEEK MEDICAL CARE IF:   · Your pain increases or pain is not controlled with medications.  · You develop new, unexplained symptoms, or an increase of the symptoms that brought you to your health care provider.  · Your pain and symptoms are not improving or are getting worse.     This information is not intended to replace advice given to you by your health care provider. Make sure you discuss any questions you have with your health care provider.     Document Released: 10/30/2001 Document Revised: 05/31/2014 Document Reviewed: 12/07/2012  Elsevier Interactive Patient Education ©2016 Elsevier Inc.

## 2016-03-06 LAB — D-DIMER, QUANTITATIVE (NOT AT ARMC): D DIMER QUANT: 0.51 ug{FEU}/mL — AB (ref <0.50)

## 2016-03-09 ENCOUNTER — Telehealth: Payer: Self-pay | Admitting: Family Medicine

## 2016-03-09 DIAGNOSIS — R7989 Other specified abnormal findings of blood chemistry: Secondary | ICD-10-CM

## 2016-03-09 DIAGNOSIS — M79661 Pain in right lower leg: Secondary | ICD-10-CM

## 2016-03-09 NOTE — Telephone Encounter (Signed)
LMTCB

## 2016-03-09 NOTE — Telephone Encounter (Signed)
Called pt to review lab work. His D dimer was positive (mildly so ) but we should get [place] to r/o clot. Pt refused [place] today 2/2 conflict. He states he can do it tomorrow. He works third shift we can call him with date and time after 4pm.   Scheduled 10/18/ 2017 at 13pm at [facility] at 515pm- enter through the [facility]. Call [phone] if you need to reschedule or have questions.   Thanks! AK

## 2016-03-10 ENCOUNTER — Ambulatory Visit: Payer: Managed Care, Other (non HMO)

## 2016-03-10 NOTE — Telephone Encounter (Signed)
Pt wants to reschedule his appointment for ultrasound and advised to schedule appointment in office for health screening form as per Dr. Luan Pulling pt's reply he will call.

## 2016-03-12 ENCOUNTER — Ambulatory Visit
Admission: RE | Admit: 2016-03-12 | Discharge: 2016-03-12 | Disposition: A | Discharge status: Home / Self Care | Payer: Managed Care, Other (non HMO) | Source: Ambulatory Visit | Attending: Family Medicine | Admitting: Family Medicine

## 2016-03-12 DIAGNOSIS — M79661 Pain in right lower leg: Secondary | ICD-10-CM

## 2016-03-12 DIAGNOSIS — R7989 Other specified abnormal findings of blood chemistry: Secondary | ICD-10-CM | POA: Insufficient documentation

## 2016-05-27 ENCOUNTER — Other Ambulatory Visit: Payer: Self-pay | Admitting: Family Medicine

## 2016-05-27 DIAGNOSIS — M7631 Iliotibial band syndrome, right leg: Secondary | ICD-10-CM

## 2016-05-27 MED ORDER — MELOXICAM 15 MG PO TABS: 15.0000 mg | ORAL_TABLET | Freq: Every day | ORAL | 0 refills | Status: DC

## 2016-07-01 ENCOUNTER — Other Ambulatory Visit: Payer: Self-pay | Admitting: Family Medicine

## 2016-07-01 DIAGNOSIS — M7631 Iliotibial band syndrome, right leg: Secondary | ICD-10-CM

## 2016-07-02 ENCOUNTER — Other Ambulatory Visit: Payer: Self-pay | Admitting: Family Medicine

## 2016-07-02 DIAGNOSIS — M7631 Iliotibial band syndrome, right leg: Secondary | ICD-10-CM

## 2016-09-13 ENCOUNTER — Encounter: Payer: Self-pay | Admitting: Nurse Practitioner

## 2016-09-13 ENCOUNTER — Ambulatory Visit (INDEPENDENT_AMBULATORY_CARE_PROVIDER_SITE_OTHER): Payer: Commercial Managed Care - PPO | Admitting: Nurse Practitioner

## 2016-09-13 VITALS — BP 148/82 | HR 82 | Temp 98.4°F | Resp 16 | Ht 66.0 in | Wt 216.6 lb

## 2016-09-13 DIAGNOSIS — S86911A Strain of unspecified muscle(s) and tendon(s) at lower leg level, right leg, initial encounter: Secondary | ICD-10-CM

## 2016-09-13 DIAGNOSIS — S96911A Strain of unspecified muscle and tendon at ankle and foot level, right foot, initial encounter: Secondary | ICD-10-CM

## 2016-09-13 NOTE — Progress Notes (Signed)
I have reviewed this encounter including the documentation in this note and/or discussed this patient with the provider, Cassell Smiles, AGPCNP-BC. I am certifying that I agree with the content of this note as supervising physician.  Nobie Putnam, [facility] 09/13/2016, 5:58 PM

## 2016-09-13 NOTE — Patient Instructions (Signed)
[place], Thank you for coming in to clinic today.  1. You likely have muscle and tendon strain in your ankle and knee.   - Rest, ice, compression, elevation. - when you are at work wear compression wrap around your knee and / or ankle for stability for the next 1-2 weeks. - Start taking Tylenol extra strength 1 to 2 tablets every 6-8 hours for aches.  Do not take more than 3,000 mg in 24 hours from all medicines.  You can take this with your meloxicam. AVOID taking any additional ibuprofen, naproxen, or aspirin.     - For your advil pm.  STOP because it has ibuprofen.  Take 1 benadryl instead if you need this medication. - Use heat and ice.  Apply this for 15 minutes at a time 6-8 times per day.   - Muscle rub with lidocaine.  Avoid using this with heat and ice to avoid burns.  If in the next 2 weeks, your pain has not improved, we will consider orthopedic referral or physical therapy.  Please schedule a follow-up appointment with Cassell Smiles, AGNP in 2 weeks or sooner if your symptoms worsen or do not improve.  If you have any other questions or concerns, please feel free to call the clinic or send a message through [facility]. You may also schedule an earlier appointment if necessary.  Cassell Smiles, DNP, AGNP-BC Adult Gerontology Nurse Practitioner [place] [facility]   Muscle Strain A muscle strain is an injury that occurs when a muscle is stretched beyond its normal length. Usually a small number of muscle fibers are torn when this happens. Muscle strain is rated in degrees. First-degree strains have the least amount of muscle fiber tearing and pain. Second-degree and third-degree strains have increasingly more tearing and pain. Usually, recovery from muscle strain takes 1-2 weeks. Complete healing takes 5-6 weeks. What are the causes? Muscle strain happens when a sudden, violent force placed on a muscle stretches it too far. This may occur with lifting, sports, or a  fall. What increases the risk? Muscle strain is especially common in athletes. What are the signs or symptoms? At the site of the muscle strain, there may be:  Pain.  Bruising.  Swelling.  Difficulty using the muscle due to pain or lack of normal function. How is this diagnosed? Your health care provider will perform a physical exam and ask about your medical history. How is this treated? Often, the best treatment for a muscle strain is resting, icing, and applying cold compresses to the injured area. Follow these instructions at home:  Use the PRICE method of treatment to promote muscle healing during the first 2-3 days after your injury. The PRICE method involves:  Protecting the muscle from being injured again.  Restricting your activity and resting the injured body part.  Icing your injury. To do this, put ice in a plastic bag. Place a towel between your skin and the bag. Then, apply the ice and leave it on from 15-20 minutes each hour. After the third day, switch to moist heat packs.  Apply compression to the injured area with a splint or elastic bandage. Be careful not to wrap it too tightly. This may interfere with blood circulation or increase swelling.  Elevate the injured body part above the level of your heart as often as you can.  Only take over-the-counter or prescription medicines for pain, discomfort, or fever as directed by your health care provider.  Warming up prior to  exercise helps to prevent future muscle strains. Contact a health care provider if:  You have increasing pain or swelling in the injured area.  You have numbness, tingling, or a significant loss of strength in the injured area. This information is not intended to replace advice given to you by your health care provider. Make sure you discuss any questions you have with your health care provider. Document Released: 05/10/2005 Document Revised: 10/16/2015 Document Reviewed: 12/07/2012 Elsevier  Interactive Patient Education  2017 [employer].

## 2016-09-13 NOTE — Progress Notes (Signed)
Subjective:    Patient ID: Craig Bruce, male    DOB: 1956/01/28, 61 y.o.   MRN: [id number]  Craig Bruce is a 61 y.o. male presenting on 09/13/2016 for Leg Pain (right leg pain since yesterday. Patient reports numbness and has been taking Meloxicam, reports moderate improvement. )   HPI  Right leg pain at knee and ankle Craig Bruce presents with right leg pain today with onset Sunday morning. On Saturday he worked in his garden tilling the soil with a tiller and planting seeds. Sunday woke up with pain in R ankle and lower leg behind knee.  He has the most difficulty with walking after first standing up.  He notes no swelling.  The pain is improving some.  He is still taking 15 mg meloxicam one pill daily since last year.  He has also used some Aspercreme with minimal relief.  He was able to stay off his leg and keep it elevated yesterday.    There was no pain on Saturday to indicate he had injured anything.  He admits he has had trouble with his left leg in the past and may have compensated with more reliance on R leg. Turns for rows of his garden were hardest and there may have been more twisting than with regular walking.  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 He was not able to work yesterday.  He does want to work tonight if possible. Requests a work note for excusing last night's absence.  Social History  Substance Use Topics  . Smoking status: Current Some Day Smoker    Packs/day: 1.00    Years: 20.00    Types: Cigarettes  . Smokeless tobacco: Never Used  . Alcohol use 1.2 oz/week    2 Cans of beer per week     Comment: BEER OCC    Review of Systems Per HPI unless specifically indicated above     Objective:    BP (!) 148/82 (BP Location: Left Arm, Patient Position: Sitting, Cuff Size: Large)   Pulse 82   Temp 98.4 F (36.9 C) (Oral)   Resp 16   Ht 5\' 6"  (1.676 m)   Wt 216 lb 9.6 oz (98.2 kg)   SpO2 99%   BMI 34.96 kg/m   Wt Readings from  Last 3 Encounters:  09/13/16 216 lb 9.6 oz (98.2 kg)  03/05/16 218 lb (98.9 kg)  07/11/15 222 lb 12.8 oz (101.1 kg)    Physical Exam  Constitutional: He is oriented to person, place, and time. He appears well-developed and well-nourished. No distress.  HENT:  Head: Normocephalic and atraumatic.  Cardiovascular: Normal rate, regular rhythm, normal heart sounds and intact distal pulses.   Pulmonary/Chest: Effort normal and breath sounds normal.  Musculoskeletal:       Right knee: He exhibits normal range of motion, no swelling, no effusion, no ecchymosis, no deformity, no laceration, no erythema, normal alignment, no LCL laxity, normal patellar mobility, no bony tenderness, normal meniscus and no MCL laxity. Tenderness found.       Right ankle: He exhibits normal range of motion, no swelling, no ecchymosis, no deformity, no laceration and normal pulse. Tenderness. AITFL tenderness found. Achilles tendon normal.  Posterior drawer test elicits pain at posterior aspect of knee. Tenderness with palpation near popliteal fossa.  No pain with internal/external rotation of ankle or PROM dorsiflexion or plantar flexion. Pain with plantar flexion and with dorsiflexion against resistance.  No pain with plantar flexion against  resistance.  Antalgic gait r/t pain with walking  Neurological: He is alert and oriented to person, place, and time. He has normal reflexes.  Skin: Skin is warm and dry.  Intact without lesion or ecchymosis on lower extremities.       Assessment & Plan:   Problem List Items Addressed This Visit    None    Visit Diagnoses    Knee strain, right, initial encounter    -  Primary   Right ankle strain, initial encounter    Pain likely self-limited.  Muscle strain likely caused by overuse and poor body mechanics when using a tiller to prepare his garden soil.  Plan:  1. Treat with NSAIDs (continue Mobic 15 mg once daily) and acetaminophen.  Discussed alternate dosing and max  dosing. 2. Apply heat and/or ice to affected area. 3. May also apply a muscle rub with lidocaine after heat or ice. 4. Consider Physical Therapy for gait and strengthening with long history of left leg difficulty.  Patient declined today. 5. Consider orthopedic referral if pain persists without improvement. 6. Reviewed RICE pneumonic. 7. Follow up in the next 2 weeks if symptoms worsen or do not improve.       No orders of the defined types were placed in this encounter.     Follow up plan: Return if symptoms worsen or fail to improve.   Cassell Smiles, DNP, AGPCNP-BC Adult Gerontology Primary Care Nurse Practitioner [facility] 09/13/2016, 9:42 AM

## 2016-10-01 ENCOUNTER — Other Ambulatory Visit: Payer: Self-pay | Admitting: Nurse Practitioner

## 2016-10-01 ENCOUNTER — Other Ambulatory Visit: Payer: Commercial Managed Care - PPO

## 2016-10-01 DIAGNOSIS — Z0000 Encounter for general adult medical examination without abnormal findings: Secondary | ICD-10-CM

## 2016-10-01 DIAGNOSIS — E669 Obesity, unspecified: Secondary | ICD-10-CM

## 2016-10-01 DIAGNOSIS — E119 Type 2 diabetes mellitus without complications: Secondary | ICD-10-CM

## 2016-10-01 LAB — COMPREHENSIVE METABOLIC PANEL
ALT: 32 U/L (ref 9–46)
AST: 33 U/L (ref 10–35)
Albumin: 4.2 g/dL (ref 3.6–5.1)
Alkaline Phosphatase: 76 U/L (ref 40–115)
BUN: 12 mg/dL (ref 7–25)
CO2: 22 mmol/L (ref 20–31)
Calcium: 8.8 mg/dL (ref 8.6–10.3)
Chloride: 107 mmol/L (ref 98–110)
Creat: 0.83 mg/dL (ref 0.70–1.25)
Glucose, Bld: 119 mg/dL — ABNORMAL HIGH (ref 65–99)
Potassium: 4.1 mmol/L (ref 3.5–5.3)
Sodium: 140 mmol/L (ref 135–146)
Total Bilirubin: 0.9 mg/dL (ref 0.2–1.2)
Total Protein: 6.7 g/dL (ref 6.1–8.1)

## 2016-10-01 LAB — CBC WITH DIFFERENTIAL/PLATELET
Basophils Absolute: 0 cells/uL (ref 0–200)
Basophils Relative: 0 %
Eosinophils Absolute: 138 cells/uL (ref 15–500)
Eosinophils Relative: 2 %
HCT: 45.2 % (ref 38.5–50.0)
Hemoglobin: 15.7 g/dL (ref 13.2–17.1)
Lymphocytes Relative: 32 %
Lymphs Abs: 2208 cells/uL (ref 850–3900)
MCH: 32 pg (ref 27.0–33.0)
MCHC: 34.7 g/dL (ref 32.0–36.0)
MCV: 92.1 fL (ref 80.0–100.0)
MPV: 9.7 fL (ref 7.5–12.5)
Monocytes Absolute: 690 cells/uL (ref 200–950)
Monocytes Relative: 10 %
Neutro Abs: 3864 cells/uL (ref 1500–7800)
Neutrophils Relative %: 56 %
Platelets: 213 10*3/uL (ref 140–400)
RBC: 4.91 MIL/uL (ref 4.20–5.80)
RDW: 13.3 % (ref 11.0–15.0)
WBC: 6.9 10*3/uL (ref 3.8–10.8)

## 2016-10-01 LAB — LIPID PANEL
Cholesterol: 266 mg/dL — ABNORMAL HIGH (ref <200)
HDL: 42 mg/dL (ref >40)
LDL Cholesterol: 176 mg/dL — ABNORMAL HIGH (ref <100)
Total CHOL/HDL Ratio: 6.3 Ratio — ABNORMAL HIGH (ref <5.0)
Triglycerides: 239 mg/dL — ABNORMAL HIGH (ref <150)
VLDL: 48 mg/dL — ABNORMAL HIGH (ref <30)

## 2016-10-02 LAB — HEMOGLOBIN A1C
Hgb A1c MFr Bld: 6 % — ABNORMAL HIGH (ref <5.7)
Mean Plasma Glucose: 126 mg/dL

## 2016-10-02 LAB — PSA: PSA: 2.7 ng/mL (ref <=4.0)

## 2016-10-02 LAB — HEPATITIS C ANTIBODY: HCV Ab: NEGATIVE

## 2016-10-02 LAB — HIV ANTIBODY (ROUTINE TESTING W REFLEX): HIV 1&2 Ab, 4th Generation: NONREACTIVE

## 2016-10-05 ENCOUNTER — Encounter: Payer: Self-pay | Admitting: Nurse Practitioner

## 2016-10-05 ENCOUNTER — Ambulatory Visit (INDEPENDENT_AMBULATORY_CARE_PROVIDER_SITE_OTHER): Payer: Commercial Managed Care - PPO | Admitting: Nurse Practitioner

## 2016-10-05 ENCOUNTER — Other Ambulatory Visit: Payer: Self-pay | Admitting: Family Medicine

## 2016-10-05 VITALS — BP 132/80 | HR 87 | Ht 67.0 in | Wt 218.0 lb

## 2016-10-05 DIAGNOSIS — F17200 Nicotine dependence, unspecified, uncomplicated: Secondary | ICD-10-CM | POA: Diagnosis not present

## 2016-10-05 DIAGNOSIS — Z7189 Other specified counseling: Secondary | ICD-10-CM

## 2016-10-05 DIAGNOSIS — M7631 Iliotibial band syndrome, right leg: Secondary | ICD-10-CM

## 2016-10-05 DIAGNOSIS — Z0000 Encounter for general adult medical examination without abnormal findings: Secondary | ICD-10-CM | POA: Diagnosis not present

## 2016-10-05 DIAGNOSIS — Z716 Tobacco abuse counseling: Secondary | ICD-10-CM

## 2016-10-05 DIAGNOSIS — E119 Type 2 diabetes mellitus without complications: Secondary | ICD-10-CM | POA: Diagnosis not present

## 2016-10-05 DIAGNOSIS — E669 Obesity, unspecified: Secondary | ICD-10-CM | POA: Diagnosis not present

## 2016-10-05 DIAGNOSIS — Z720 Tobacco use: Secondary | ICD-10-CM

## 2016-10-05 LAB — POCT UA - MICROALBUMIN: Microalbumin Ur, POC: 50 mg/L

## 2016-10-05 MED ORDER — LISINOPRIL 5 MG PO TABS: 5.0000 mg | ORAL_TABLET | Freq: Every day | ORAL | 1 refills | Status: DC

## 2016-10-05 NOTE — Progress Notes (Signed)
The pt friend Stanton Kidney was notified of the lab results and recommendation to start Lisinopril 5MG . She verbalize understanding and was informed to have the pt to call back if he have any question.

## 2016-10-05 NOTE — Patient Instructions (Signed)
Craig Bruce, Thank you for coming in to clinic today.  1. Colon Cancer Screening: - For all adults age 62 and older, routine colon cancer screening is highly recommended. - Early detection of colon cancer is important, because often there are no warning signs or symptoms.  If colon cancer is found early, usually it can be cured. Advanced cancer is hard to treat.  - If you are not interested in Colonoscopy screening (if done and normal you could be cleared for 5 to 10 years until next due), then Cologuard is an excellent alternative for screening test for Colon Cancer. It is highly sensitive for detecting DNA of colon cancer from even the earliest stages. Also, there is NO bowel prep required. - If Cologuard is NEGATIVE, then it is good for 3 years before next due - If Cologuard is POSITIVE, then it is strongly advised to get a Colonoscopy, which allows the GI doctor to locate the source of the cancer or polyp (even very early stage) and treat it by removing it. ------------------------- If you would like to proceed with Cologuard (stool DNA test) - FIRST, call your insurance company and tell them you want to check cost of Cologuard tell them CPT Code [id number] (it may be completely covered with a small or no cost, OR max cost without any coverage is about $600). If you do NOT open the kit, and decide not to do the test, you will NOT be charged, you should contact the company to return the kit if you decide not to do the test. - If you want to proceed, you can notify us (office phone, [place], or at next visit) and we will order it for you. The test kit will be delivered to your house in about 1 week. Follow instructions to collect your stool sample.  You may call the company for any help or questions, 24/7 telephone support at [phone].   2. We recommend a retinal eye exam.  Please contact [facility] for your retinal exam.  3. Your LDL or bad cholesterol is elevated.  Start taking Fish  Oil - Start taking 1,000 mg once daily.  Choose the brand you prefer. HDL is your good cholesterol.  When it is higher than 50, it can be protective against and prevent cholesterol buildup in your arteries that could lead to heart attack or stroke.  You can increase your HDL by exercising, by taking fish oil 1,000 mg daily or by increasing your dietary omega-3 fatty acids.  Omega-3s are found in olive oil, fish, and seeds and nuts like almonds, walnuts, and pecans.    4. Continue using your compression stockings when you work.  This will help your veins and leg swelling.  Please schedule a follow-up appointment with Cassell Smiles, AGNP to Return in about 6 months (around 04/07/2017) for Diabetes diet controlled.  If you have any other questions or concerns, please feel free to call the clinic or send a message through [place]. You may also schedule an earlier appointment if necessary.  Cassell Smiles, DNP, AGNP-BC Adult Gerontology Nurse Practitioner [facility], [facility]    Chronic Venous Insufficiency Chronic venous insufficiency, also called venous stasis, is a condition that prevents blood from being pumped effectively through the veins in your legs. Blood may no longer be pumped effectively from the legs back to the heart. This condition can range from mild to severe. With proper treatment, you should be able to continue with an active life. What are  the causes? Chronic venous insufficiency occurs when the vein walls become stretched, weakened, or damaged, or when valves within the vein are damaged. Some common causes of this include:  High blood pressure inside the veins (venous hypertension).  Increased blood pressure in the leg veins from long periods of sitting or standing.  A blood clot that blocks blood flow in a vein (deep vein thrombosis, DVT).  Inflammation of a vein (phlebitis) that causes a blood clot to form.  Tumors in the pelvis that cause blood to back  up. What increases the risk? The following factors may make you more likely to develop this condition:  Having a family history of this condition.  Obesity.  Pregnancy.  Living without enough physical activity or exercise (sedentary lifestyle).  Smoking.  Having a job that requires long periods of standing or sitting in one place.  Being a certain age. Women in their 71s and 50s and men in their 75s are more likely to develop this condition. What are the signs or symptoms? Symptoms of this condition include:  Veins that are enlarged, bulging, or twisted (varicose veins).  Skin breakdown or ulcers.  Reddened or discolored skin on the front of the leg.  Brown, smooth, tight, and painful skin just above the ankle, usually on the inside of the leg (lipodermatosclerosis).  Swelling. How is this diagnosed? This condition may be diagnosed based on:  Your medical history.  A physical exam.  Tests, such as:  A procedure that creates an image of a blood vessel and nearby organs and provides information about blood flow through the blood vessel (duplex ultrasound).  A procedure that tests blood flow (plethysmography).  A procedure to look at the veins using X-ray and dye (venogram). How is this treated? The goals of treatment are to help you return to an active life and to minimize pain or disability. Treatment depends on the severity of your condition, and it may include:  Wearing compression stockings. These can help relieve symptoms and help prevent your condition from getting worse. However, they do not cure the condition.  Sclerotherapy. This is a procedure involving an injection of a material that "dissolves" damaged veins.  Surgery. This may involve:  Removing a diseased vein (vein stripping).  Cutting off blood flow through the vein (laser ablation surgery).  Repairing a valve. Follow these instructions at home:  Wear compression stockings as told by your  health care provider. These stockings help to prevent blood clots and reduce swelling in your legs.  Take over-the-counter and prescription medicines only as told by your health care provider.  Stay active by exercising, walking, or doing different activities. Ask your health care provider what activities are safe for you and how much exercise you need.  Drink enough fluid to keep your urine clear or pale yellow.  Do not use any products that contain nicotine or tobacco, such as cigarettes and e-cigarettes. If you need help quitting, ask your health care provider.  Keep all follow-up visits as told by your health care provider. This is important. Contact a health care provider if:  You have redness, swelling, or more pain in the affected area.  You see a red streak or line that extends up or down from the affected area.  You have skin breakdown or a loss of skin in the affected area, even if the breakdown is small.  You get an injury in the affected area. Get help right away if:  You get an injury and  an open wound in the affected area.  You have severe pain that does not get better with medicine.  You have sudden numbness or weakness in the foot or ankle below the affected area, or you have trouble moving your foot or ankle.  You have a fever and you have worse or persistent symptoms.  You have chest pain.  You have shortness of breath. Summary  Chronic venous insufficiency, also called venous stasis, is a condition that prevents blood from being pumped effectively through the veins in your legs.  Chronic venous insufficiency occurs when the vein walls become stretched, weakened, or damaged, or when valves within the vein are damaged.  Treatment for this condition depends on how severe your condition is, and it may involve wearing compression stockings or having a procedure.  Make sure you stay active by exercising, walking, or doing different activities. Ask your health  care provider what activities are safe for you and how much exercise you need. This information is not intended to replace advice given to you by your health care provider. Make sure you discuss any questions you have with your health care provider. Document Released: 09/13/2006 Document Revised: 03/29/2016 Document Reviewed: 03/29/2016 Elsevier Interactive Patient Education  2017 [employer].

## 2016-10-05 NOTE — Addendum Note (Signed)
Addended by: Cleaster Corin on: 10/05/2016 02:59 PM   Modules accepted: Orders

## 2016-10-05 NOTE — Progress Notes (Signed)
Attempted to contact pt, no answer. LMOM

## 2016-10-05 NOTE — Progress Notes (Signed)
I have reviewed this encounter including the documentation in this note and/or discussed this patient with the provider, Cassell Smiles, AGPCNP-BC. I am certifying that I agree with the content of this note as supervising physician.  Nobie Putnam, [facility] 10/05/2016, 5:25 PM

## 2016-10-05 NOTE — Progress Notes (Signed)
Subjective:    Patient ID: Craig Bruce, male    DOB: 31-Aug-1955, 61 y.o.   MRN: [id number]  Craig Bruce is a 61 y.o. male presenting on 10/05/2016 for Annual Exam   HPI  Annual Physical Patient has been feeling well.  They have no acute concerns today. Sleeps 5 hours per day interrupted.  Sleeps daytime  HEALTH MAINTENANCE: Weight/BMI: Stable (increased by 2 lbs)  No desire to lose. Physical activity: Walks back and forth at his job 6 hours.  Knee is improving. Diet: [place] Pro foods 2 x per week.  Eats out 2 x per month.  Eats lower salt and lower fat. Seatbelt: always Sunscreen: Sometimes Prostate exam/PSA: normal PSA. Colonoscopy: no family history of colon cancer.  No prior record of colonoscopy and pt does not remember having had a colonoscopy in past. Opthalmology: 2009-2010 Tetanus: up to date   DIABETES: He has controlled his Diabetes with diet and physical activity for the last 2 years.  He had been on metformin in the past, but has not required pharmacological treatment now.  He dies wear work boots that cause callus, but he has not had any open sores that do not heal.He does have some numbness of left foot.   Pt denies polydipsia, polyphagia, polyuria, headaches, diaphoresis, shakiness, chills, pain, tingling in extremities, and changes in vision.     Past Medical History:  Diagnosis Date  . Annual physical exam 03/24/2015  . Arthritis    BOTH HANDS  . Cancer ([place])   . Chronic kidney disease    H/O KIDNEY STONES  . Diabetes mellitus type 2, controlled, without complications ([place]) 09/17/621  . DVT (deep venous thrombosis) ([place]) 1996  . Gastritis   . Immunization due 03/24/2015  . Need for influenza vaccination 03/24/2015  . Obesity (BMI 30.0-34.9) 02/17/2015  . Smoker 02/17/2015   Past Surgical History:  Procedure Laterality Date  . ORCHIECTOMY Right 06/25/2015   Procedure: ORCHIECTOMY;  Surgeon: Nickie Retort, MD;  Location: [facility] ORS;  Service: Urology;   Laterality: Right;  . WISDOM TOOTH EXTRACTION     Social History   Social History  . Marital status: Single    Spouse name: N/A  . Number of children: N/A  . Years of education: N/A   Occupational History  . Not on file.   Social History Main Topics  . Smoking status: Current Every Day Smoker    Packs/day: 0.20    Years: 20.00    Types: Cigarettes  . Smokeless tobacco: Never Used     Comment: Previously smoked 1ppd.  Cutting back and down from 5 cigs / day to 4 cigs per day in 4 weeks.  . Alcohol use 1.2 oz/week    2 Cans of beer per week     Comment: BEER OCC  . Drug use: No  . Sexual activity: Not Currently   Other Topics Concern  . Not on file   Social History Narrative  . No narrative on file   Family History  Problem Relation Age of Onset  . Prostate cancer Neg Hx   . Kidney cancer Neg Hx   . Kidney failure Neg Hx    No current outpatient prescriptions on file prior to visit.   No current facility-administered medications on file prior to visit.     Review of Systems  Constitutional: Positive for fatigue.       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   Eyes: Negative.  Respiratory: Negative.   Cardiovascular: Positive for leg swelling.  Gastrointestinal: Negative.   Endocrine: Negative.   Genitourinary: Negative.   Musculoskeletal: Positive for arthralgias and joint swelling.  Skin: Negative.   Allergic/Immunologic: Negative.   Neurological:       Pt states his memory has worsened since his anesthesia from orchiectomy.    Hematological: Negative.   Psychiatric/Behavioral: Negative.    Per HPI unless specifically indicated above     Objective:    BP 132/80   Pulse 87   Ht _0  (1.702 m)   Wt 218 lb (98.9 kg)   BMI 34.14 kg/m     Wt Readings from Last 3 Encounters:  10/05/16 218 lb (98.9 kg)  09/13/16 216 lb 9.6 oz (98.2 kg)  03/05/16 218 lb (98.9 kg)     Physical Exam  Constitutional: He is oriented to person, place, and  time. He appears well-developed and well-nourished. No distress.  Fatigued  HENT:  Head: Normocephalic and atraumatic.  Right Ear: External ear normal.  Left Ear: External ear normal.  Mouth/Throat: Oropharynx is clear and moist.  Eyes: Conjunctivae are normal. Pupils are equal, round, and reactive to light.  Neck: Normal range of motion. Neck supple. No JVD present. No tracheal deviation present. No thyromegaly present.  No carotid bruit  Cardiovascular: Normal rate, regular rhythm and intact distal pulses.   No murmur heard. Pulmonary/Chest: Effort normal and breath sounds normal.  Abdominal: Soft. Bowel sounds are normal. He exhibits no distension. There is no hepatosplenomegaly. There is tenderness in the right lower quadrant. There is no CVA tenderness. Hernia confirmed negative in the right inguinal area and confirmed negative in the left inguinal area.  Genitourinary: Prostate normal and penis normal.  Musculoskeletal: Normal range of motion.       Right knee: Normal.       Left knee: He exhibits swelling. He exhibits normal range of motion.       Right ankle: Normal.       Left ankle: He exhibits swelling. He exhibits normal range of motion. No tenderness. Achilles tendon normal.       Left lower leg: He exhibits swelling and edema.       Legs: Crepitus noted  Lymphadenopathy:    He has no cervical adenopathy.       Right: No inguinal adenopathy present.       Left: No inguinal adenopathy present.  Neurological: He is alert and oriented to person, place, and time. He has normal reflexes. No cranial nerve deficit.  Skin: Skin is warm and dry.  Psychiatric: He has a normal mood and affect. His behavior is normal. Judgment and thought content normal.   Diabetic Foot Exam - Simple   Simple Foot Form Diabetic Foot exam was performed with the following findings:  Yes 10/05/2016 10:40 AM  Visual Inspection No deformities, no ulcerations, no other skin breakdown bilaterally:   Yes Sensation Testing See comments:  Yes Pulse Check Posterior Tibialis and Dorsalis pulse intact bilaterally:  Yes Comments Numbness on left foot from MTP to distal toes.  Decreased sensation to monofilament.  Gross dull sensation intact. Normal monofilament sensation on right foot.      Results for orders placed or performed in visit on 10/01/16  CBC with Differential/Platelet  Result Value Ref Range   WBC 6.9 3.8 - 10.8 K/uL   RBC 4.91 4.20 - 5.80 MIL/uL   Hemoglobin 15.7 13.2 - 17.1 g/dL   HCT 45.2 38.5 - 50.0 %  MCV 92.1 80.0 - 100.0 fL   MCH 32.0 27.0 - 33.0 pg   MCHC 34.7 32.0 - 36.0 g/dL   RDW 13.3 11.0 - 15.0 %   Platelets 213 140 - 400 K/uL   MPV 9.7 7.5 - 12.5 fL   Neutro Abs 3,864 1,500 - 7,800 cells/uL   Lymphs Abs 2,208 850 - 3,900 cells/uL   Monocytes Absolute 690 200 - 950 cells/uL   Eosinophils Absolute 138 15 - 500 cells/uL   Basophils Absolute 0 0 - 200 cells/uL   Neutrophils Relative % 56 %   Lymphocytes Relative 32 %   Monocytes Relative 10 %   Eosinophils Relative 2 %   Basophils Relative 0 %   Smear Review Criteria for review not met   Comprehensive metabolic panel  Result Value Ref Range   Sodium 140 135 - 146 mmol/L   Potassium 4.1 3.5 - 5.3 mmol/L   Chloride 107 98 - 110 mmol/L   CO2 22 20 - 31 mmol/L   Glucose, Bld 119 (H) 65 - 99 mg/dL   BUN 12 7 - 25 mg/dL   Creat 0.83 0.70 - 1.25 mg/dL   Total Bilirubin 0.9 0.2 - 1.2 mg/dL   Alkaline Phosphatase 76 40 - 115 U/L   AST 33 10 - 35 U/L   ALT 32 9 - 46 U/L   Total Protein 6.7 6.1 - 8.1 g/dL   Albumin 4.2 3.6 - 5.1 g/dL   Calcium 8.8 8.6 - 10.3 mg/dL  Hemoglobin A1c  Result Value Ref Range   Hgb A1c MFr Bld 6.0 (H) <5.7 %   Mean Plasma Glucose 126 mg/dL  Hepatitis C antibody  Result Value Ref Range   HCV Ab NEGATIVE NEGATIVE  HIV antibody  Result Value Ref Range   HIV 1&2 Ab, 4th Generation NONREACTIVE NONREACTIVE  Lipid Profile  Result Value Ref Range   Cholesterol 266 (H) <200  mg/dL   Triglycerides 239 (H) <150 mg/dL   HDL 42 >40 mg/dL   Total CHOL/HDL Ratio 6.3 (H) <5.0 Ratio   VLDL 48 (H) <30 mg/dL   LDL Cholesterol 176 (H) <100 mg/dL  PSA  Result Value Ref Range   PSA 2.7 <=4.0 ng/mL      Assessment & Plan:   Problem List Items Addressed This Visit      Endocrine   Diabetes mellitus type 2, diet controlled, without complications (HCC) Stable. Controlled by diet.  Plan: 1. Continue work toward eating fewer sugars, increasing vegetables and fruits, and avoiding fried foods. 2. Obtain urine microalbumin as pt not on ace/arb. 3. Obtain retinal eye exam.  Pt ok for [facility].   Relevant Orders   POCT UA - Microalbumin   Ambulatory referral to Ophthalmology     Other   Obesity (BMI 30.0-34.9) Obese.  Stable weight since last year.  Increase of 3 lbs in 1 month.  Plan: 1. Discussed healthy weight goals.  Pt not willing to try to lose weight at this time. 2. Reviewed importance of maintaining weight and losing 5-10 lbs in 1 year for improving glucose control and other health risks.       Other Visit Diagnoses    Annual physical exam    -  Primary Discussed health maintenance.  Orders written to address outstanding screening measures. Paperwork completed for employer health screening.    Encounter for smoking cessation counseling     Discussion today >5 minutes (<10 minutes) specifically on counseling on risks  of tobacco use, complications, treatment, smoking cessation.    Plan: Pt denies pharmacological management at this time.  Encouraged continued decrease to stopping cigarettes.  Celebrated progress toward goal, albeit slow progress.           Follow up plan: Return in about 6 months (around 04/07/2017) for Diabetes diet controlled.  Cassell Smiles, DNP, AGPCNP-BC Adult Gerontology Primary Care Nurse Practitioner [facility] 10/05/2016, 1:48 PM

## 2016-10-06 ENCOUNTER — Telehealth: Payer: Self-pay | Admitting: Nurse Practitioner

## 2016-10-06 ENCOUNTER — Telehealth: Payer: Self-pay

## 2016-10-06 DIAGNOSIS — Z1211 Encounter for screening for malignant neoplasm of colon: Secondary | ICD-10-CM

## 2016-10-06 NOTE — Telephone Encounter (Addendum)
I called the pt  to f/u on the cost of the Cologuard test. At this time he had not followed up w/ his insurance company, but stated he will give them a call and get back with [place].          ----- Message from Mikey College, NP sent at 10/05/2016  1:51 PM EDT ----- Please call patient to request response for cologard on 5/17.  He was given instructions in clinic about determining insurance coverage.   The following information was provided during his visit: Colon Cancer Screening: - For all adults age 32 and older, routine colon cancer screening is highly recommended. - Early detection of colon cancer is important, because often there are no warning signs or symptoms.  If colon cancer is found early, usually it can be cured. Advanced cancer is hard to treat.  - If you are not interested in Colonoscopy screening (if done and normal you could be cleared for 5 to 10 years until next due), then Cologuard is an excellent alternative for screening test for Colon Cancer. It is highly sensitive for detecting DNA of colon cancer from even the earliest stages. Also, there is NO bowel prep required. - If Cologuard is NEGATIVE, then it is good for 3 years before next due - If Cologuard is POSITIVE, then it is strongly advised to get a Colonoscopy, which allows the GI doctor to locate the source of the cancer or polyp (even very early stage) and treat it by removing it. ------------------------- If you would like to proceed with Cologuard (stool DNA test) - FIRST, call your insurance company and tell them you want to check cost of Cologuard tell them CPT Code [phone] (it may be completely covered with a small or no cost, OR max cost without any coverage is about $600). If you do NOT open the kit, and decide not to do the test, you will NOT be charged, you should contact the company to return the kit if you decide not to do the test. - If you want to proceed, you can notify us (office phone, [place], or at next visit) and we will order it for you. The test kit will be delivered to your house in about 1 week. Follow instructions to collect your stool sample.  You may call the company for any help or questions, 24/7 telephone support at [phone].

## 2016-10-06 NOTE — Telephone Encounter (Signed)
Pt called insurance company and they told him a screening colonoscopy is covered.  They should be calling for more information.  Please call pt when they do [phone]

## 2016-10-06 NOTE — Addendum Note (Signed)
Addended by: Cassell Smiles R on: 10/06/2016 11:33 AM   Modules accepted: Level of Service

## 2016-10-06 NOTE — Telephone Encounter (Signed)
LMTCB

## 2016-10-06 NOTE — Progress Notes (Signed)
The pt friend Stanton Kidney was notified about pt labs and medication sent to the pharmacy. I also informed her to call if pt have any questions or concerns. Please see previous note.

## 2016-10-08 NOTE — Telephone Encounter (Signed)
Pt wants cologuard will order it.

## 2016-10-12 ENCOUNTER — Other Ambulatory Visit: Payer: Self-pay

## 2016-10-12 ENCOUNTER — Telehealth: Payer: Self-pay

## 2016-10-12 NOTE — Addendum Note (Signed)
Addended by: Wilson Singer on: 10/12/2016 10:05 AM   Modules accepted: Orders

## 2016-10-12 NOTE — Telephone Encounter (Signed)
Pt called to inform him of his Eye Exam Appt scheduled for Thursday, June 21st @ 9:00am at [facility].  [phone]

## 2016-10-13 NOTE — Telephone Encounter (Signed)
The pt was notified of his appt.

## 2016-10-19 LAB — COLOGUARD: Cologuard: NEGATIVE

## 2016-12-03 ENCOUNTER — Encounter: Payer: Self-pay | Admitting: Nurse Practitioner

## 2016-12-03 ENCOUNTER — Ambulatory Visit (INDEPENDENT_AMBULATORY_CARE_PROVIDER_SITE_OTHER): Payer: Commercial Managed Care - PPO | Admitting: Nurse Practitioner

## 2016-12-03 VITALS — BP 120/71 | HR 68 | Temp 98.5°F | Ht 67.0 in | Wt 214.0 lb

## 2016-12-03 DIAGNOSIS — K529 Noninfective gastroenteritis and colitis, unspecified: Secondary | ICD-10-CM

## 2016-12-03 NOTE — Patient Instructions (Addendum)
Craig Bruce, Thank you for coming in to clinic today.  1. You likely - START Immodium AD for your diarrhea.   - Drink plenty of fluids - at least 2 L of fluid alternating between 2 water,  And 1 Gatorade/Powerade.  Avoid sugary drinks and caffeine.  If symptoms get worse with difficulty breathing at night (working harder to breathe or faster breathing), fevers still >101, vomiting or not tolerating any food or liquids, or decreased urinating with no peeing in 12 hours. Then we recommend returning for re-evaluation or you may go to Urgent Care or Emergency Department.   Please schedule a follow-up appointment with Cassell Smiles, AGNP to Return 3-5 days if not improving.  If you have any other questions or concerns, please feel free to call the clinic or send a message through [place]. You may also schedule an earlier appointment if necessary.  Cassell Smiles, DNP, AGNP-BC Adult Gerontology Nurse Practitioner [facility], [place] poisoning is an illness that is caused by eating or drinking contaminated foods or drinks. In most cases, food poisoning is mild and lasts 1-2 days. However, some cases can be serious, especially for people who have weak body defense (immune) systems, older people, children and infants, and pregnant women. What are the causes? Foods can become contaminated with viruses, bacteria, parasites, mold, or chemicals as a result of:  Poor personal hygiene, such as poor hand washing practices.  Storing food improperly, such as not refrigerating raw meat.  Using unclean surfaces for serving, preparing, and storing food.  Cooking or eating with unclean utensils.  If contaminated food is eaten, viruses, bacteria, or parasites can harm the intestine. This often causes severe diarrhea. The most common causes of food poisoning include:  Viruses, such as: ? Norovirus. ? Rotavirus.  Bacteria, such as: ? Salmonella. ? Listeria. ? E. coli  (Escherichia coli).  Parasites, such as: ? Giardia. ? Toxoplasmosis.  What are the signs or symptoms? Symptoms may take several hours to appear after you consume contaminated food or drink. Symptoms include:  Nausea.  Vomiting.  Cramping.  Diarrhea.  Fever and chills.  Muscle aches.  Dehydration. Dehydration can cause you to be tired and thirsty, have a dry mouth, and urinate less frequently.  How is this diagnosed? Your health care provider can diagnose food poisoning with a medical history and physical exam. This will include asking you what you have recently eaten. You may also have tests, including:  Blood tests.  Stool tests.  How is this treated? Treatment focuses on relieving your symptoms and making sure that you are hydrated. You may also be given medicines. In severe cases, hospitalization may be required and you may need to receive fluids through an IV tube. Follow these instructions at home: Eating and drinking   Drink enough fluids to keep your urine clear or pale yellow. You may need to drink small amounts of clear liquids frequently.  Avoid milk, caffeine, and alcohol.  Ask your health care provider for specific rehydration instructions.  Eat small, frequent meals rather than large meals. Medicines  Take over-the-counter and prescription medicines only as told by your health care provider. Ask your health care provider if you should continue to take any of your regular prescribed and over-the-counter medicines.  If you were prescribed an antibiotic medicine, take it as told by your health care provider. Do not stop taking the antibiotic even if you start to feel better. General instructions  Wash your  hands thoroughly before you prepare food and after you go to the bathroom (use the toilet). Make sure people who live with you also wash their hands often.  Clean surfaces that you touch with a product that contains chlorine bleach.  Keep all  follow-up visits as told by your health care provider. This is important. How is this prevented?  Wash your hands, food preparation surfaces, and utensils thoroughly before and after you handle raw foods.  Use separate food preparation surfaces and storage spaces for raw meat and for fruits and vegetables.  Keep refrigerated foods colder than 75F (5C).  Serve hot foods immediately or keep them heated above 175F (60C).  Store dry foods in cool, dry spaces away from excess heat or moisture. Throw out any foods that do not smell right or are in cans that are bulging.  Follow approved canning procedures.  Heat canned foods thoroughly before you taste them.  Drink bottled or sterile water when you travel. Get help right away if: Call 911 or go to the emergency room if:  You have difficulty breathing, swallowing, talking, or moving.  You develop blurred vision.  You cannot eat or drink without vomiting.  You faint.  Your eyes turn yellow.  Your vomiting or diarrhea is persistent.  Abdominal pain develops, increases, or localizes in one small area.  You have a fever.  You have blood or mucus in your stools, or your stools look dark black and tarry.  You have signs of dehydration, such as: ? Dark urine, very little urine, or no urine. ? Cracked lips. ? Not making tears while crying. ? Dry mouth. ? Sunken eyes. ? Sleepiness. ? Weakness. ? Dizziness.  This information is not intended to replace advice given to you by your health care provider. Make sure you discuss any questions you have with your health care provider. Document Released: 02/06/2004 Document Revised: 10/07/2015 Document Reviewed: 11/11/2014 Elsevier Interactive Patient Education  [employer].

## 2016-12-03 NOTE — Progress Notes (Signed)
Subjective:    Patient ID: Craig Bruce, male    DOB: March 02, 1956, 60 y.o.   MRN: [id number]  Craig Bruce is a 61 y.o. male presenting on 12/03/2016 for GI Problem (The pt states he ate at [employer] yesterday and after he developed flu like symptoms. muscle aches, loose (2)bowel movement, and swelling in neck  )   HPI  Diarrhea and GI Disturbance Ate [employer] yesterday and went home to sleep. Ate a chicken sandwich, fries and sweet tea. Began to feel poorly at work overnight, w/ aches, store stomach, and left work around 1 am. (5:30).  Rested before coming to clinic. - Pt notes 2 BM once got home.  Small,  had some incontinence, BM watery, no small pieces. - Stomach still aches today, but was able to eat 2 pieces toast today w/ butter and grape/apple juice and water.  No nausea w/ food. - Has had subjective fever, sweats. - Denies chills, vomiting.  Currently no nausea, no diarrhea since 2:30.Urinating regularly.  Able to drink fluids.    Social History  Substance Use Topics  . Smoking status: Current Every Day Smoker    Packs/day: 0.20    Years: 20.00    Types: Cigarettes  . Smokeless tobacco: Never Used     Comment: Previously smoked 1ppd.  Cutting back and down from 5 cigs / day to 4 cigs per day in 4 weeks.  . Alcohol use 1.2 oz/week    2 Cans of beer per week     Comment: BEER OCC    Review of Systems Per HPI unless specifically indicated above     Objective:    BP 120/71 (BP Location: Right Arm, Patient Position: Sitting, Cuff Size: Normal)   Pulse 68   Temp 98.5 F (36.9 C) (Oral)   Ht 5\' 7"  (1.702 m)   Wt 214 lb (97.1 kg)   SpO2 99%   BMI 33.52 kg/m   Wt Readings from Last 3 Encounters:  12/03/16 214 lb (97.1 kg)  10/05/16 218 lb (98.9 kg)  09/13/16 216 lb 9.6 oz (98.2 kg)    Physical Exam  Constitutional: He appears well-developed and well-nourished. He has a sickly appearance.  HENT:  Head: Normocephalic and atraumatic.  Nose: Nose normal.    Mouth/Throat: Uvula is midline, oropharynx is clear and moist and mucous membranes are normal.  Neck: Normal range of motion. Neck supple. No JVD present. No tracheal deviation present.  Cardiovascular: Normal rate, regular rhythm, normal heart sounds and intact distal pulses.   Pulmonary/Chest: Effort normal and breath sounds normal. No respiratory distress.  Abdominal: Soft. Bowel sounds are increased. There is no hepatomegaly. There is tenderness in the epigastric area and periumbilical area. There is rebound.    Lymphadenopathy:    He has no cervical adenopathy.  Neurological: He is alert.  Skin: Skin is warm. He is diaphoretic.  Psychiatric: He has a normal mood and affect. His speech is normal and behavior is normal.     Results for orders placed or performed in visit on 11/01/16  Cologuard  Result Value Ref Range   Cologuard Negative       Assessment & Plan:   Problem List Items Addressed This Visit    None    Visit Diagnoses    Gastroenteritis, acute    -  Primary Likely viral gastroenteritis r/t food contamination.  Pt's symptom onset w/in last 24 hours and slowly improving course.  Plan: 1. Encourage fluids and bland foods.  Alternate 1 Gatorade/Powerade for every 2 glasses/bottles water. Avoid sugary drinks and caffeine. 2. Encourage rest. 3. If more than 3 BM in 1 hour, take Immodium AD for diarrhea. 4. If not able to drink fluids or urinate in > 12 hours, seek care in Urgent Care or ED.  If not improving in 2-4 days, return to clinic or seek care at Urgent Care if over weekend. 5. Follow up as needed at [facility] in 3-5 days.      No orders of the defined types were placed in this encounter.     Follow up plan: Return 3-5 days if not improving.   Cassell Smiles, DNP, AGPCNP-BC Adult Gerontology Primary Care Nurse Practitioner [facility] 12/04/2016, 6:03 PM

## 2016-12-04 NOTE — Progress Notes (Signed)
I have reviewed this encounter including the documentation in this note and/or discussed this patient with the provider, Cassell Smiles, AGPCNP-BC. I am certifying that I agree with the content of this note as supervising physician.  Nobie Putnam, [facility] 12/04/2016, 8:18 PM

## 2016-12-27 LAB — HM DIABETES EYE EXAM

## 2017-01-15 ENCOUNTER — Other Ambulatory Visit: Payer: Self-pay | Admitting: Nurse Practitioner

## 2017-01-15 DIAGNOSIS — M7631 Iliotibial band syndrome, right leg: Secondary | ICD-10-CM

## 2017-04-09 ENCOUNTER — Other Ambulatory Visit: Payer: Self-pay | Admitting: Nurse Practitioner

## 2017-04-11 ENCOUNTER — Ambulatory Visit (INDEPENDENT_AMBULATORY_CARE_PROVIDER_SITE_OTHER): Payer: Commercial Managed Care - PPO | Admitting: Nurse Practitioner

## 2017-04-11 ENCOUNTER — Other Ambulatory Visit: Payer: Self-pay

## 2017-04-11 ENCOUNTER — Encounter: Payer: Self-pay | Admitting: Nurse Practitioner

## 2017-04-11 VITALS — BP 127/75 | HR 90 | Temp 98.8°F | Ht 67.0 in | Wt 217.8 lb

## 2017-04-11 DIAGNOSIS — G4709 Other insomnia: Secondary | ICD-10-CM | POA: Diagnosis not present

## 2017-04-11 DIAGNOSIS — E782 Mixed hyperlipidemia: Secondary | ICD-10-CM

## 2017-04-11 DIAGNOSIS — Z23 Encounter for immunization: Secondary | ICD-10-CM

## 2017-04-11 DIAGNOSIS — E119 Type 2 diabetes mellitus without complications: Secondary | ICD-10-CM

## 2017-04-11 DIAGNOSIS — E785 Hyperlipidemia, unspecified: Secondary | ICD-10-CM | POA: Insufficient documentation

## 2017-04-11 LAB — POCT UA - MICROALBUMIN: Microalbumin Ur, POC: 50 mg/L

## 2017-04-11 LAB — POCT GLYCOSYLATED HEMOGLOBIN (HGB A1C): Hemoglobin A1C: 6.5

## 2017-04-11 MED ORDER — ATORVASTATIN CALCIUM 40 MG PO TABS: 40.0000 mg | ORAL_TABLET | Freq: Every day | ORAL | 3 refills | Status: DC

## 2017-04-11 NOTE — Progress Notes (Signed)
Subjective:    Patient ID: Craig Bruce, male    DOB: [birth year], 61 y.o.   MRN: [id number]  Craig Bruce is a 61 y.o. male presenting on 04/11/2017 for Diabetes   HPI Diabetes Pt presents today for follow up of Type 2 diabetes mellitus. He is not checking CBG at home - Current diabetic medications include: none - He is not currently symptomatic.  - He denies polydipsia, polyphagia, polyuria, headaches, diaphoresis, shakiness, chills, pain, numbness or tingling in extremities and changes in vision.   - Clinical course has been worsening. - He  reports no regular exercise routine. - His diet is moderate in salt, moderate in fat, and moderate in carbohydrates. - Weight trend: stable  PREVENTION: Eye exam current (within one year): yes Foot exam current (within one year): yes Lipid/ASCVD risk reduction - on statin: not currently Kidney protection - on ace or arb: none currently Recent Labs    10/01/16 1140 04/11/17 1157  HGBA1C 6.0* 6.5    Hypertension - He is checking BP at home or outside of clinic.  Readings unknown today as pt does not remember the results. - Current medications: none,   - He is not currently symptomatic. - Pt denies headache, lightheadedness, dizziness, changes in vision, chest tightness/pressure, palpitations, leg swelling, sudden loss of speech or loss of consciousness.  Insomnia: - Sleeps a few hours: Works 3rd shift.  Sometimes only gets 2-3 hours sleep.  Same on days off. - Only occasionally sleeps 6 hours. Patient would like to discuss options for treatment of insomnia.  He denies mood disorders as he does not feel down, depressed, or hopeless; he also does not feel anxious and stressed regularly.  Social History   Tobacco Use  . Smoking status: Current Every Day Smoker    Packs/day: 0.20    Years: 20.00    Pack years: 4.00    Types: Cigarettes  . Smokeless tobacco: Never Used  . Tobacco comment: Previously smoked 1ppd.  Cutting back  and down from 5 cigs / day to 4 cigs per day in 4 weeks.  Substance Use Topics  . Alcohol use: Yes    Alcohol/week: 1.2 oz    Types: 2 Cans of beer per week    Comment: BEER OCC  . Drug use: No    Review of Systems Per HPI unless specifically indicated above     Objective:    BP 127/75 (BP Location: Right Arm, Patient Position: Sitting, Cuff Size: Normal)   Pulse 90   Temp 98.8 F (37.1 C) (Oral)   Ht 5\' 7"  (1.702 m)   Wt 217 lb 12.8 oz (98.8 kg)   BMI 34.11 kg/m   Wt Readings from Last 3 Encounters:  04/11/17 217 lb 12.8 oz (98.8 kg)  12/03/16 214 lb (97.1 kg)  10/05/16 218 lb (98.9 kg)    Physical Exam  General - obese, well-appearing, NAD HEENT - Normocephalic, atraumatic Neck - supple, non-tender, no LAD,  no carotid bruit Heart - RRR, no murmurs heard Lungs - Clear throughout all lobes, no wheezing, crackles, or rhonchi. Normal work of breathing. Extremeties - non-tender, no edema, cap refill < 2 seconds, peripheral pulses intact +2 bilaterally Skin - warm, dry Neuro - awake, alert, oriented x3, normal gait Psych - Normal mood and affect, normal behavior   Results for orders placed or performed in visit on 04/11/17  POCT UA - Microalbumin  Result Value Ref Range   Microalbumin Ur, POC 50 mg/L  Creatinine, POC  mg/dL   Albumin/Creatinine Ratio, Urine, POC    POCT glycosylated hemoglobin (Hb A1C)  Result Value Ref Range   Hemoglobin A1C 6.5       Assessment & Plan:   Problem List Items Addressed This Visit      Endocrine   Diet-controlled type 2 diabetes mellitus ([place])    Stable.  Previously controlled by diet.  Patient feels he is continuing to manage very well with diet and exercise.  He avoids eating sweets and does not wish to start medications so continues this lifestyle.  - Eye exam updated and no evidence of retinopathy - Needs to start statin.  Plan: 1. Continue work toward eating fewer sugars, increasing vegetables and fruits, and avoiding  fried foods. 2. Obtain urine microalbumin as pt not on ace/arb. 3. Followup 3 months.      Relevant Medications   atorvastatin (LIPITOR) 40 MG tablet   Other Relevant Orders   POCT UA - Microalbumin (Completed)   POCT glycosylated hemoglobin (Hb A1C) (Completed)     Other   Mixed hyperlipidemia - Primary    Patient with new diagnosis mixed hyperlipidemia on last lab check in May 2018.  Patient has started statin therapy and is taking atorvastatin 40 mg once daily.  He is tolerating it well without myalgias.  There are no new signs of advancing or new ASCVD events.  Plan: 1. Continue taking atorvastatin 40 mg once daily 2. Encourage a low glycemic diet as part of diabetes will also help lipids. 3.  Encouraged regular exercise. 4.  We will check new lipid panel at next appointment. 5.  Follow-up in 3 months       Relevant Medications   atorvastatin (LIPITOR) 40 MG tablet   Other insomnia    Patient has chronic sleep shift work insomnia.  He works third shift at [employer] and sleeps during the day.  He has had a hard time sleeping more than 2 or 3 hours at a time over recent weeks.  He has not tried sleep medications in the past and has not used melatonin.  P discussedlan: 1.  Sleep disorders are more common with patients who work shift work.  Explained circadian rhythm and disruption of melatonin.  Suggested supplementation of melatonin for 2 weeks.  Patient verbalizes understanding and wishes to proceed. - START taking 5 mg melatonin OTC each day 30 minutes before bedtime.   2. Follow-up at next appointment as needed.       Other Visit Diagnoses    Needs flu shot       Relevant Orders   Flu Vaccine QUAD 6+ mos PF IM (Fluarix Quad PF) (Completed)      Meds ordered this encounter  Medications  . atorvastatin (LIPITOR) 40 MG tablet    Sig: Take 1 tablet (40 mg total) daily by mouth.    Dispense:  90 tablet    Refill:  3    Order Specific Question:   Supervising Provider      Answer:   Olin Hauser [[id number]]    Follow up plan: Return in about 3 months (around 07/12/2017) for Diabetes with labs 2-7 days prior.  Cassell Smiles, DNP, AGPCNP-BC Adult Gerontology Primary Care Nurse Practitioner [facility] 04/22/2017, 12:26 PM

## 2017-04-11 NOTE — Patient Instructions (Addendum)
[place], Thank you for coming in to clinic today.  1. For your sleep: - Let's do a shift work sleep cycle reset.  TAKE melatonin 2-5 mg each morning when you go to bed for 2 weeks.  2. For your blood pressure: - No current medications and you are at goal.   3. For your diabetes: - Continue working to decrease your sugars and carbohydrates. - Stay active.  - We will consider metformin at your next visit in 3 months. - START atorvastatin 40 mg tablet once daily for your cholesterol to reduce your risk of heart attack and stroke. - Improving sleep patterns will help your sugar control.  Aim for 4.5 - 5 hours per day.  - We will recheck your cholesterol in 3 months.    You will be due for [place].  This means you should eat no food or drink after midnight.  Drink only water or coffee without cream/sugar on the morning of your lab visit. - Please go ahead and schedule a "Lab Only" visit in the morning at the clinic for lab draw 3 days before your next office visit. - Your results will be available about 2-3 days after blood draw.  If you have set up a MyChart account, you can can log in to MyChart online to view your results and a brief explanation. Also, we can discuss your results together at your next office visit if you would like.  Please schedule a follow-up appointment with Cassell Smiles, AGNP. Return in about 3 months (around 07/12/2017) for Diabetes with labs 2-7 days prior.  If you have any other questions or concerns, please feel free to call the clinic or send a message through [place]. You may also schedule an earlier appointment if necessary.  You will receive a survey after today's visit either digitally by e-mail or paper by [employer]. Your experiences and feedback matter to [place].  Please respond so we know how we are doing as we provide care for you.   Cassell Smiles, DNP, AGNP-BC Adult Gerontology Nurse Practitioner [place]

## 2017-04-22 ENCOUNTER — Encounter: Payer: Self-pay | Admitting: Nurse Practitioner

## 2017-04-22 DIAGNOSIS — G4709 Other insomnia: Secondary | ICD-10-CM | POA: Insufficient documentation

## 2017-04-22 NOTE — Assessment & Plan Note (Signed)
Stable.  Previously controlled by diet.  Patient feels he is continuing to manage very well with diet and exercise.  He avoids eating sweets and does not wish to start medications so continues this lifestyle.  - Eye exam updated and no evidence of retinopathy - Needs to start statin.  Plan: 1. Continue work toward eating fewer sugars, increasing vegetables and fruits, and avoiding fried foods. 2. Obtain urine microalbumin as pt not on ace/arb. 3. Followup 3 months.

## 2017-04-22 NOTE — Assessment & Plan Note (Signed)
Patient with new diagnosis mixed hyperlipidemia on last lab check in May 2018.  Patient has started statin therapy and is taking atorvastatin 40 mg once daily.  He is tolerating it well without myalgias.  There are no new signs of advancing or new ASCVD events.  Plan: 1. Continue taking atorvastatin 40 mg once daily 2. Encourage a low glycemic diet as part of diabetes will also help lipids. 3.  Encouraged regular exercise. 4.  We will check new lipid panel at next appointment. 5.  Follow-up in 3 months

## 2017-04-22 NOTE — Assessment & Plan Note (Addendum)
Patient has chronic sleep shift work insomnia.  He works third shift at [employer] and sleeps during the day.  He has had a hard time sleeping more than 2 or 3 hours at a time over recent weeks.  He has not tried sleep medications in the past and has not used melatonin.  P discussedlan: 1.  Sleep disorders are more common with patients who work shift work.  Explained circadian rhythm and disruption of melatonin.  Suggested supplementation of melatonin for 2 weeks.  Patient verbalizes understanding and wishes to proceed. - START taking 5 mg melatonin OTC each day 30 minutes before bedtime.   2. Follow-up at next appointment as needed.

## 2017-07-14 ENCOUNTER — Other Ambulatory Visit: Payer: Self-pay | Admitting: Nurse Practitioner

## 2017-07-14 DIAGNOSIS — E119 Type 2 diabetes mellitus without complications: Secondary | ICD-10-CM

## 2017-07-14 DIAGNOSIS — E782 Mixed hyperlipidemia: Secondary | ICD-10-CM

## 2017-07-15 ENCOUNTER — Other Ambulatory Visit: Payer: Commercial Managed Care - PPO

## 2017-07-15 DIAGNOSIS — E119 Type 2 diabetes mellitus without complications: Secondary | ICD-10-CM

## 2017-07-15 DIAGNOSIS — E782 Mixed hyperlipidemia: Secondary | ICD-10-CM

## 2017-07-16 LAB — HEMOGLOBIN A1C
Hgb A1c MFr Bld: 6 % of total Hgb — ABNORMAL HIGH (ref <5.7)
Mean Plasma Glucose: 126 (calc)
eAG (mmol/L): 7 (calc)

## 2017-07-16 LAB — COMPLETE METABOLIC PANEL WITH GFR
AG Ratio: 1.6 (calc) (ref 1.0–2.5)
ALT: 33 U/L (ref 9–46)
AST: 28 U/L (ref 10–35)
Albumin: 4.2 g/dL (ref 3.6–5.1)
Alkaline phosphatase (APISO): 88 U/L (ref 40–115)
BUN: 10 mg/dL (ref 7–25)
CO2: 22 mmol/L (ref 20–32)
Calcium: 8.9 mg/dL (ref 8.6–10.3)
Chloride: 109 mmol/L (ref 98–110)
Creat: 0.78 mg/dL (ref 0.70–1.25)
GFR, Est African American: 112 mL/min/{1.73_m2} (ref >=60)
GFR, Est Non African American: 97 mL/min/{1.73_m2} (ref >=60)
Globulin: 2.7 g/dL (calc) (ref 1.9–3.7)
Glucose, Bld: 126 mg/dL — ABNORMAL HIGH (ref 65–99)
Potassium: 3.9 mmol/L (ref 3.5–5.3)
Sodium: 142 mmol/L (ref 135–146)
Total Bilirubin: 0.9 mg/dL (ref 0.2–1.2)
Total Protein: 6.9 g/dL (ref 6.1–8.1)

## 2017-07-16 LAB — LIPID PANEL
Cholesterol: 202 mg/dL — ABNORMAL HIGH (ref <200)
HDL: 45 mg/dL (ref >40)
LDL Cholesterol (Calc): 123 mg/dL (calc) — ABNORMAL HIGH
Non-HDL Cholesterol (Calc): 157 mg/dL (calc) — ABNORMAL HIGH (ref <130)
Total CHOL/HDL Ratio: 4.5 (calc) (ref <5.0)
Triglycerides: 220 mg/dL — ABNORMAL HIGH (ref <150)

## 2017-07-21 ENCOUNTER — Encounter: Payer: Self-pay | Admitting: Nurse Practitioner

## 2017-07-21 ENCOUNTER — Other Ambulatory Visit: Payer: Self-pay | Admitting: Nurse Practitioner

## 2017-07-21 ENCOUNTER — Other Ambulatory Visit: Payer: Self-pay

## 2017-07-21 ENCOUNTER — Ambulatory Visit (INDEPENDENT_AMBULATORY_CARE_PROVIDER_SITE_OTHER): Payer: Commercial Managed Care - PPO | Admitting: Nurse Practitioner

## 2017-07-21 VITALS — BP 138/72 | HR 84 | Temp 98.7°F | Ht 67.0 in | Wt 212.8 lb

## 2017-07-21 DIAGNOSIS — E119 Type 2 diabetes mellitus without complications: Secondary | ICD-10-CM | POA: Diagnosis not present

## 2017-07-21 DIAGNOSIS — M25562 Pain in left knee: Secondary | ICD-10-CM

## 2017-07-21 DIAGNOSIS — M1732 Unilateral post-traumatic osteoarthritis, left knee: Secondary | ICD-10-CM | POA: Diagnosis not present

## 2017-07-21 DIAGNOSIS — E669 Obesity, unspecified: Secondary | ICD-10-CM

## 2017-07-21 DIAGNOSIS — G8929 Other chronic pain: Secondary | ICD-10-CM

## 2017-07-21 DIAGNOSIS — E782 Mixed hyperlipidemia: Secondary | ICD-10-CM | POA: Diagnosis not present

## 2017-07-21 MED ORDER — MELOXICAM 15 MG PO TABS: 15.0000 mg | ORAL_TABLET | Freq: Every day | ORAL | 5 refills | Status: DC

## 2017-07-21 MED ORDER — ATORVASTATIN CALCIUM 40 MG PO TABS: 80.0000 mg | ORAL_TABLET | Freq: Every day | ORAL | 3 refills | Status: DC

## 2017-07-21 NOTE — Patient Instructions (Addendum)
Craig Bruce, Thank you for coming in to clinic today.  1. 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 - START taking melatonin 5-6 mg each day about 30 minutes to 1 hour before you are ready to go fall asleep.   Increase to 10 mg as needed.    2. For your arthritis.  - continue taking meloxicam.  Your kidney function is good and is safe to continue this.  3. For your diabetes: continue eating a good healthy diet.  4. For your cholesterol: INCREASE your atorvastatin 40 mg tablet to 80 mg once daily.  Take 2 tablets daily until you get your next refill.  5. I am making a referral to orthopedics for your left knee.  I believe it may be a Baker's Cyst.  Please schedule a follow-up appointment with Cassell Smiles, AGNP. Return in about 3 months (around 10/18/2017).  If you have any other questions or concerns, please feel free to call the clinic or send a message through [place]. You may also schedule an earlier appointment if necessary.  You will receive a survey after today's visit either digitally by e-mail or paper by [employer]. Your experiences and feedback matter to [place].  Please respond so we know how we are doing as we provide care for you.   Cassell Smiles, DNP, AGNP-BC Adult Gerontology Nurse Practitioner [facility], [facility]   Sleep Hygiene Tips  Take medicines only as directed by your health care provider.  Keep regular sleeping and waking hours. Avoid naps.  Keep a sleep diary to help you and your health care provider figure out what could be causing your insomnia. Include:  When you sleep.  When you wake up during the night.  How well you sleep.  How rested you feel the next day.  Any side effects of medicines you are taking.  What you eat and drink.  Make your bedroom a comfortable place where it is easy to fall asleep:  Put up shades or special blackout curtains to block light from outside.  Use a white noise machine to block noise.  Keep the  temperature cool.  Exercise regularly as directed by your health care provider. Avoid exercising right before bedtime.  Use relaxation techniques to manage stress. Ask your health care provider to suggest some techniques that may work well for you. These may include:  Breathing exercises.  Routines to release muscle tension.  Visualizing peaceful scenes.  Cut back on alcohol, caffeinated beverages, and cigarettes, especially close to bedtime. These can disrupt your sleep.  Do not overeat or eat spicy foods right before bedtime. This can lead to digestive discomfort that can make it hard for you to sleep.  Limit screen use before bedtime. This includes:  Watching TV.  Using your smartphone, tablet, and computer.  Stick to a routine. This can help you fall asleep faster. Try to do a quiet activity, brush your teeth, and go to bed at the same time each night.  Get out of bed if you are still awake after 15 minutes of trying to sleep. Keep the lights down, but try reading or doing a quiet activity. When you feel sleepy, go back to bed.  Make sure that you drive carefully. Avoid driving if you feel very sleepy.  Keep all follow-up appointments as directed by your health care provider. This is important.

## 2017-07-21 NOTE — Progress Notes (Signed)
Subjective:    Patient ID: Craig Bruce, male    DOB: 02/21/1956, 62 y.o.   MRN: [id number]  Craig Bruce is a 62 y.o. male presenting on 07/21/2017 for Diabetes (f/u appt, bilateral leg pain since the meloxicam was stopped. ) and Insomnia   HPI  Diabetes Pt presents today for follow up of Type 2 diabetes mellitus. He is not checking CBG at home - Current diabetic medications include: none.  Managed w/ diet and activity - He is not currently symptomatic.  - He denies polydipsia, polyphagia, polyuria, headaches, diaphoresis, shakiness, chills, pain, numbness or tingling in extremities and changes in vision.   - Clinical course has been improving. - He  is very physically active at work.  Stays active outside work as well.. - His diet is moderate in salt, moderate in fat, and moderate in carbohydrates. - Weight trend: decreasing steadily  PREVENTION: Eye exam current (within one year): yes Foot exam current (within one year): yes  Lipid/ASCVD risk reduction - on statin: yes Kidney protection - on ace or arb: yes Recent Labs    10/01/16 1140 04/11/17 1157 07/15/17 0829  HGBA1C 6.0* 6.5 6.0*     Hyperlipidemia Pt reports adherence to low carb diet and high level of regular physical activity.  He is taking atorvastatin 40 mg once daily and is tolerating well. - Pt denies changes in vision, chest tightness/pressure, palpitations, shortness of breath, leg pain while walking, leg or arm weakness, and sudden loss of speech or loss of consciousness.   Insomnia  - Took 2 gel caps for sleep and was awake in 4.5 hours.   Generic sleep aid similar to Tylenol PM. 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   - Has not used any other techniques to alter sleep patterns.  Osteoarthritis Has stopped taking meloxicam as no refills were remaining.  He has had return of knee pain in bilateral knees, but worst in  Left knee.  Pt reports aching/throbbing pain in lateral/posterior knee occasionally at home and needs to support his knee with a pillow/elevate for relief. Pt has had left leg trauma in past and has regular swelling in left leg.  Usually wears compression sock, but did not last night.  Social History   Tobacco Use  . Smoking status: Current Every Day Smoker    Packs/day: 0.20    Years: 20.00    Pack years: 4.00    Types: Cigarettes  . Smokeless tobacco: Never Used  . Tobacco comment: Previously smoked 1ppd.  Cutting back and down from 5 cigs / day to 4 cigs per day in 4 weeks.  Substance Use Topics  . Alcohol use: Yes    Alcohol/week: 1.2 oz    Types: 2 Cans of beer per week    Comment: BEER OCC  . Drug use: No    Review of Systems Per HPI unless specifically indicated above     Objective:    BP (!) 144/78 (BP Location: Right Arm, Patient Position: Sitting, Cuff Size: Large)   Pulse 84   Temp 98.7 F (37.1 C) (Oral)   Ht 5\' 7"  (1.702 m)   Wt 212 lb 12.8 oz (96.5 kg)   BMI 33.33 kg/m   BP recheck 138/72  Wt Readings from Last 3 Encounters:  07/21/17 212 lb 12.8 oz (96.5 kg)  04/11/17 217 lb 12.8 oz (98.8 kg)  12/03/16 214 lb (97.1  kg)    Physical Exam  Constitutional: He is oriented to person, place, and time. He appears well-developed and well-nourished. No distress.  Neck: Normal range of motion. Neck supple.  Cardiovascular: Normal rate, regular rhythm, S1 normal, S2 normal, normal heart sounds and intact distal pulses.  Pulmonary/Chest: Effort normal and breath sounds normal. No respiratory distress.  Musculoskeletal: He exhibits no edema (pedal).  Bilateral Knees Inspection: Normal appearance.  LLE with +3, non-pitting edema. No ecchymosis or effusion. Palpation: Non-tender. Mild +TTP Left knee only lateral/posterior knee. Crepitus bilaterally. ROM: Full active ROM bilaterally Special Testing: Lachman / Valgus/Varus tests negative with intact ligaments (ACL,  MCL, LCL). McMurray negative without meniscus symptoms. Strength: 5/5 intact knee flex/ext, ankle dorsi/plantarflex Neurovascular: distally intact sensation light touch and pulses   Neurological: He is alert and oriented to person, place, and time.  Skin: Skin is warm and dry.  Psychiatric: He has a normal mood and affect. His behavior is normal.  Vitals reviewed.   Results for orders placed or performed in visit on 07/15/17  Hemoglobin A1c  Result Value Ref Range   Hgb A1c MFr Bld 6.0 (H) <5.7 % of total Hgb   Mean Plasma Glucose 126 (calc)   eAG (mmol/L) 7.0 (calc)  Lipid panel  Result Value Ref Range   Cholesterol 202 (H) <200 mg/dL   HDL 45 >40 mg/dL   Triglycerides 220 (H) <150 mg/dL   LDL Cholesterol (Calc) 123 (H) mg/dL (calc)   Total CHOL/HDL Ratio 4.5 <5.0 (calc)   Non-HDL Cholesterol (Calc) 157 (H) <130 mg/dL (calc)  COMPLETE METABOLIC PANEL WITH GFR  Result Value Ref Range   Glucose, Bld 126 (H) 65 - 99 mg/dL   BUN 10 7 - 25 mg/dL   Creat 0.78 0.70 - 1.25 mg/dL   GFR, Est Non African American 97 > OR = 60 mL/min/1.63m2   GFR, Est African American 112 > OR = 60 mL/min/1.83m2   BUN/Creatinine Ratio NOT APPLICABLE 6 - 22 (calc)   Sodium 142 135 - 146 mmol/L   Potassium 3.9 3.5 - 5.3 mmol/L   Chloride 109 98 - 110 mmol/L   CO2 22 20 - 32 mmol/L   Calcium 8.9 8.6 - 10.3 mg/dL   Total Protein 6.9 6.1 - 8.1 g/dL   Albumin 4.2 3.6 - 5.1 g/dL   Globulin 2.7 1.9 - 3.7 g/dL (calc)   AG Ratio 1.6 1.0 - 2.5 (calc)   Total Bilirubin 0.9 0.2 - 1.2 mg/dL   Alkaline phosphatase (APISO) 88 40 - 115 U/L   AST 28 10 - 35 U/L   ALT 33 9 - 46 U/L      Assessment & Plan:   Problem List Items Addressed This Visit      Endocrine   Diet-controlled type 2 diabetes mellitus ([place]) - Primary Well-controlledDM with A1c 6.0% improved from 6.5% at last visit and goal A1c < 7.0%. - No known complications or hypoglycemia.    Plan:  1. Continue to encourage improved lifestyle: - low  carb/low glycemic diet - Increase physical activity to 30 minutes most days of the week.  Explained that increased physical activity increases body's use of sugar for energy. 2. Continue ACEi, Statin 3. Follow-up 3 months    Relevant Medications   lisinopril (PRINIVIL,ZESTRIL) 5 MG tablet   atorvastatin (LIPITOR) 40 MG tablet     Other   Mixed hyperlipidemia Improved on atorvastatin and with diet and activity.  Labs show still above goal of LDL < 70  for people who have DM.  PT currently tolerating atorvastatin without myalgias or other side effects.  Plan: 1. Increase atorvastatin to 80 mg once daily.  For now, take two 40mg  tablets daily.  If any new side effects, will return to 40 mg daily.  If tolerated well, will send 80 mg tablets. 2. Continue healthy, low glycemic diet and physical activity. 3. Followup 3 months.   Relevant Medications   lisinopril (PRINIVIL,ZESTRIL) 5 MG tablet   atorvastatin (LIPITOR) 40 MG tablet    Other Visit Diagnoses    Post-traumatic osteoarthritis of left knee     Pt reports chronic pain and crepitus in left knee s/p traumatic accident to LLE many years ago. Now with worsening pain in posterior/lateral aspect of knee.  Palpable small mass noted at tendon/ligament that is approximately 1 cm round.  Potential for Baker's cyst vs ganglion cyst.    Plan: 1. Continue meloxicam 15 mg once daily. 2. Continue rest/elevation. 3. START applying ice/heat x 15 minutes as needed. 4. Referral to orthopedics (Pt goal for visit in next 6-8 weeks). 5. Followup in next 3 months as needed.   Relevant Medications   meloxicam (MOBIC) 15 MG tablet   Other Relevant Orders   Ambulatory referral to Orthopedic Surgery   Chronic pain of left knee       Relevant Medications   meloxicam (MOBIC) 15 MG tablet   Other Relevant Orders   Ambulatory referral to Orthopedic Surgery      Meds ordered this encounter  Medications  . meloxicam (MOBIC) 15 MG tablet    Sig: Take 1  tablet (15 mg total) by mouth daily.    Dispense:  30 tablet    Refill:  5    Order Specific Question:   Supervising Provider    Answer:   Olin Hauser [[id number]]  . atorvastatin (LIPITOR) 40 MG tablet    Sig: Take 2 tablets (80 mg total) by mouth daily.    Dispense:  90 tablet    Refill:  3    Order Specific Question:   Supervising Provider    Answer:   Olin Hauser [[id number]]    Follow up plan: Return in about 3 months (around 10/18/2017).  Cassell Smiles, DNP, AGPCNP-BC Adult Gerontology Primary Care Nurse Practitioner [facility] 07/21/2017, 8:11 AM

## 2017-09-28 ENCOUNTER — Other Ambulatory Visit: Payer: Self-pay | Admitting: Nurse Practitioner

## 2017-10-20 ENCOUNTER — Encounter: Payer: Self-pay | Admitting: Nurse Practitioner

## 2017-10-20 ENCOUNTER — Other Ambulatory Visit: Payer: Self-pay

## 2017-10-20 ENCOUNTER — Ambulatory Visit (INDEPENDENT_AMBULATORY_CARE_PROVIDER_SITE_OTHER): Payer: Commercial Managed Care - PPO | Admitting: Nurse Practitioner

## 2017-10-20 VITALS — BP 148/75 | HR 86 | Temp 98.6°F | Ht 67.0 in | Wt 211.2 lb

## 2017-10-20 DIAGNOSIS — M1732 Unilateral post-traumatic osteoarthritis, left knee: Secondary | ICD-10-CM | POA: Diagnosis not present

## 2017-10-20 DIAGNOSIS — E782 Mixed hyperlipidemia: Secondary | ICD-10-CM | POA: Diagnosis not present

## 2017-10-20 DIAGNOSIS — R030 Elevated blood-pressure reading, without diagnosis of hypertension: Secondary | ICD-10-CM | POA: Diagnosis not present

## 2017-10-20 DIAGNOSIS — E119 Type 2 diabetes mellitus without complications: Secondary | ICD-10-CM

## 2017-10-20 LAB — POCT GLYCOSYLATED HEMOGLOBIN (HGB A1C): Hemoglobin A1C: 6.1 % — AB (ref 4.0–5.6)

## 2017-10-20 MED ORDER — ATORVASTATIN CALCIUM 80 MG PO TABS: 80.0000 mg | ORAL_TABLET | Freq: Every day | ORAL | 5 refills | Status: DC

## 2017-10-20 MED ORDER — LISINOPRIL 5 MG PO TABS: 5.0000 mg | ORAL_TABLET | Freq: Every day | ORAL | 1 refills | Status: DC

## 2017-10-20 MED ORDER — MELOXICAM 15 MG PO TABS: 15.0000 mg | ORAL_TABLET | Freq: Every day | ORAL | 1 refills | Status: DC

## 2017-10-20 NOTE — Patient Instructions (Addendum)
Craig Bruce,   Thank you for coming in to clinic today.  1. Check your atorvastatin.  Make sure you are taking 80 mg total per day, which may be two 40 mg tablets.  Your new refill will be one 80 mg tablet.  2. Continue lisinopril and meloxicam without changes.  3. Labs in 6 weeks.  Come fasting if possible. You will be due for [name].  This means you should eat no food or drink after midnight.  Drink only water or coffee without cream/sugar on the morning of your lab visit. - Please go ahead and schedule a "Lab Only" visit in the morning at the clinic for lab draw in 6 weeks. - Your results will be available about 2-3 days after blood draw.  If you have set up a MyChart account, you can can log in to MyChart online to view your results and a brief explanation. Also, we can discuss your results together at your next office visit if you would like.   Please schedule a follow-up appointment with Cassell Smiles, AGNP. Return in about 6 months (around 04/22/2018) for diabetes, AND in 2 weeks for BP check with CMA.  If you have any other questions or concerns, please feel free to call the clinic or send a message through [facility]. You may also schedule an earlier appointment if necessary.  You will receive a survey after today's visit either digitally by e-mail or paper by [employer]. Your experiences and feedback matter to [place].  Please respond so we know how we are doing as we provide care for you.   Cassell Smiles, DNP, AGNP-BC Adult Gerontology Nurse Practitioner [facility]

## 2017-10-20 NOTE — Progress Notes (Signed)
Subjective:    Patient ID: Craig Bruce, male    DOB: 1955/06/11, 63 y.o.   MRN: [id number]  Craig Bruce is a 62 y.o. male presenting on 10/20/2017 for Diabetes   HPI Diabetes Pt presents today for follow up of Type 2 diabetes mellitus. He is not checking CBG at home - Current diabetic medications include: none - He is not currently symptomatic.  - He denies polydipsia, polyphagia, polyuria, headaches, diaphoresis, shakiness, chills, pain, numbness or tingling in extremities and changes in vision.   - Clinical course has been stable. - He  reports an exercise routine that includes lots of walking at work, activity with yardowork at home, 6-7 days per week. - His diet is moderate in salt, moderate in fat, and moderate in carbohydrates. - Weight trend: stable  PREVENTION: Eye exam current (within one year): yes Foot exam current (within one year): yes Lipid/ASCVD risk reduction - on statin: yes Kidney protection - on ace or arb: yes Recent Labs    04/11/17 1157 07/15/17 0829  HGBA1C 6.5 6.0*   Hypertension - He is not checking BP at home or outside of clinic.  Readings have been elevated at most recent 3 visits in clinic - Current medications: none except low dose lisinopril for T2DM kidney protection, tolerating well without side effects - He is not currently symptomatic. - Pt denies headache, lightheadedness, dizziness, changes in vision, chest tightness/pressure, palpitations, leg swelling, sudden loss of speech or loss of consciousness.  Social History   Tobacco Use  . Smoking status: Current Every Day Smoker    Packs/day: 0.50    Years: 20.00    Pack years: 10.00    Types: Cigarettes  . Smokeless tobacco: Never Used  . Tobacco comment: Previously smoked 1ppd.  Cutting back and down from 5 cigs / day to 4 cigs per day in 4 weeks.  Substance Use Topics  . Alcohol use: Yes    Alcohol/week: 1.2 oz    Types: 2 Cans of beer per week    Comment: BEER OCC  . Drug  use: No    Review of Systems Per HPI unless specifically indicated above     Objective:    BP (!) 148/75 (BP Location: Right Arm, Patient Position: Sitting, Cuff Size: Normal)   Pulse 86   Temp 98.6 F (37 C) (Oral)   Ht 5\' 7"  (1.702 m)   Wt 211 lb 3.2 oz (95.8 kg)   BMI 33.08 kg/m   Wt Readings from Last 3 Encounters:  10/20/17 211 lb 3.2 oz (95.8 kg)  07/21/17 212 lb 12.8 oz (96.5 kg)  04/11/17 217 lb 12.8 oz (98.8 kg)    Physical Exam  Constitutional: He is oriented to person, place, and time. He appears well-developed and well-nourished. No distress.  HENT:  Head: Normocephalic and atraumatic.  Neck: Normal range of motion. Neck supple. Carotid bruit is not present.  Cardiovascular: Normal rate, regular rhythm, S1 normal, S2 normal, normal heart sounds and intact distal pulses.  Pulmonary/Chest: Effort normal and breath sounds normal. No respiratory distress.  Musculoskeletal: He exhibits no edema (pedal).  Neurological: He is alert and oriented to person, place, and time.  Skin: Skin is warm and dry. Capillary refill takes less than 2 seconds.  Psychiatric: He has a normal mood and affect. His behavior is normal. Judgment and thought content normal.  Vitals reviewed.    Results for orders placed or performed in visit on 07/15/17  Hemoglobin A1c  Result  Value Ref Range   Hgb A1c MFr Bld 6.0 (H) <5.7 % of total Hgb   Mean Plasma Glucose 126 (calc)   eAG (mmol/L) 7.0 (calc)  Lipid panel  Result Value Ref Range   Cholesterol 202 (H) <200 mg/dL   HDL 45 >40 mg/dL   Triglycerides 220 (H) <150 mg/dL   LDL Cholesterol (Calc) 123 (H) mg/dL (calc)   Total CHOL/HDL Ratio 4.5 <5.0 (calc)   Non-HDL Cholesterol (Calc) 157 (H) <130 mg/dL (calc)  COMPLETE METABOLIC PANEL WITH GFR  Result Value Ref Range   Glucose, Bld 126 (H) 65 - 99 mg/dL   BUN 10 7 - 25 mg/dL   Creat 0.78 0.70 - 1.25 mg/dL   GFR, Est Non African American 97 > OR = 60 mL/min/1.89m2   GFR, Est African  American 112 > OR = 60 mL/min/1.33m2   BUN/Creatinine Ratio NOT APPLICABLE 6 - 22 (calc)   Sodium 142 135 - 146 mmol/L   Potassium 3.9 3.5 - 5.3 mmol/L   Chloride 109 98 - 110 mmol/L   CO2 22 20 - 32 mmol/L   Calcium 8.9 8.6 - 10.3 mg/dL   Total Protein 6.9 6.1 - 8.1 g/dL   Albumin 4.2 3.6 - 5.1 g/dL   Globulin 2.7 1.9 - 3.7 g/dL (calc)   AG Ratio 1.6 1.0 - 2.5 (calc)   Total Bilirubin 0.9 0.2 - 1.2 mg/dL   Alkaline phosphatase (APISO) 88 40 - 115 U/L   AST 28 10 - 35 U/L   ALT 33 9 - 46 U/L      Assessment & Plan:   Problem List Items Addressed This Visit      Endocrine   Diet-controlled type 2 diabetes mellitus ([name]) - Primary    Stable.  Continues to be controlled by diet.  Patient feels he is continuing to manage very well with diet and exercise.  He avoids eating sweets and does not wish to start medications so he continues this lifestyle.  - Eye exam current and no evidence of retinopathy  - Has started statin and increased dose at last visit, but pt did not actually increase.  Will increase today.  Has previously tolerated statin well without side effects.  Plan: 1. Continue work toward eating fewer sugars, increasing vegetables and fruits, and avoiding fried foods. - Continue active lifestyle. 2. Continue ace-I and statin. 3. Labs 6 weeks after increasing atorvastatin. 4. Followup 6 months.      Relevant Medications   atorvastatin (LIPITOR) 80 MG tablet   lisinopril (PRINIVIL,ZESTRIL) 5 MG tablet   Other Relevant Orders   POCT glycosylated hemoglobin (Hb A1C) (Completed)   COMPLETE METABOLIC PANEL WITH GFR   Lipid panel     Musculoskeletal and Integument   Post-traumatic osteoarthritis of left knee    Stable with use of meloxicam 15 mg once daily for pain relief.  Pt lives active lifestyle which provides relief of some pain, but other activities worsen pain as pt cannot always stop and rest when pain starts to worsen.  Continue meloxicam 15 mg once daily.  Refill  provided.  Renal function stable.  Recheck with labs in 6 weeks.  Followup 6 months.      Relevant Medications   meloxicam (MOBIC) 15 MG tablet     Other   Mixed hyperlipidemia    Patient with continued mixed hyperlipidemia without LDL at goal on atorvastatin at last lab check.  Patient has started statin therapy and is taking atorvastatin 40 mg once  daily.  He is tolerating it well without myalgias.  There are no new signs of advancing or new ASCVD events.  Plan: 1. INCREASE to atorvastatin 80 mg once daily for LDL goal < 70 in patient with DM. 2. Encourage a low glycemic diet as part of diabetes will also help lipids. 3.  Encouraged regular exercise. 4.  We will check new lipid panel in 6 weeks 5.  Follow-up in 6 months       Relevant Medications   atorvastatin (LIPITOR) 80 MG tablet   lisinopril (PRINIVIL,ZESTRIL) 5 MG tablet    Other Visit Diagnoses    Elevated BP without diagnosis of hypertension     Pt with likely diagnosis of hypertension after persistent elevation.  However, pt with sleep deprivation today and possible contribution to elevated BP today.  Return to clinic 2 weeks for recheck.  Likely will need to increase lisinopril for hypertension management.  Pt verbalizes understanding.      Meds ordered this encounter  Medications  . atorvastatin (LIPITOR) 80 MG tablet    Sig: Take 1 tablet (80 mg total) by mouth daily.    Dispense:  90 tablet    Refill:  5    Do not fill until pt requests refill    Order Specific Question:   Supervising Provider    Answer:   Olin Hauser [[id number]]  . lisinopril (PRINIVIL,ZESTRIL) 5 MG tablet    Sig: Take 1 tablet (5 mg total) by mouth daily.    Dispense:  90 tablet    Refill:  1    Do not fill until pt requests refill    Order Specific Question:   Supervising Provider    Answer:   Olin Hauser [[id number]]  . meloxicam (MOBIC) 15 MG tablet    Sig: Take 1 tablet (15 mg total) by mouth daily.    Dispense:  90  tablet    Refill:  1    Do not fill until pt requests refill    Order Specific Question:   Supervising Provider    Answer:   Olin Hauser [[id number]]    Follow up plan: Return in about 6 months (around 04/22/2018) for diabetes, AND in 2 weeks for BP check with CMA.  Craig Smiles, DNP, AGPCNP-BC Adult Gerontology Primary Care Nurse Practitioner [facility] 10/20/2017, 1:59 PM

## 2017-10-21 ENCOUNTER — Encounter: Payer: Self-pay | Admitting: Nurse Practitioner

## 2017-10-21 DIAGNOSIS — M1732 Unilateral post-traumatic osteoarthritis, left knee: Secondary | ICD-10-CM | POA: Insufficient documentation

## 2017-10-21 DIAGNOSIS — M1990 Unspecified osteoarthritis, unspecified site: Secondary | ICD-10-CM | POA: Insufficient documentation

## 2017-10-21 NOTE — Assessment & Plan Note (Addendum)
Stable.  Continues to be controlled by diet.  Patient feels he is continuing to manage very well with diet and exercise.  He avoids eating sweets and does not wish to start medications so he continues this lifestyle.  - Eye exam current and no evidence of retinopathy  - Has started statin and increased dose at last visit, but pt did not actually increase.  Will increase today.  Has previously tolerated statin well without side effects.  Plan: 1. Continue work toward eating fewer sugars, increasing vegetables and fruits, and avoiding fried foods. - Continue active lifestyle. 2. Continue ace-I and statin. 3. Labs 6 weeks after increasing atorvastatin. 4. Followup 6 months.

## 2017-10-21 NOTE — Assessment & Plan Note (Signed)
Patient with continued mixed hyperlipidemia without LDL at goal on atorvastatin at last lab check.  Patient has started statin therapy and is taking atorvastatin 40 mg once daily.  He is tolerating it well without myalgias.  There are no new signs of advancing or new ASCVD events.  Plan: 1. INCREASE to atorvastatin 80 mg once daily for LDL goal < 70 in patient with DM. 2. Encourage a low glycemic diet as part of diabetes will also help lipids. 3.  Encouraged regular exercise. 4.  We will check new lipid panel in 6 weeks 5.  Follow-up in 6 months

## 2017-10-21 NOTE — Assessment & Plan Note (Signed)
Stable with use of meloxicam 15 mg once daily for pain relief.  Pt lives active lifestyle which provides relief of some pain, but other activities worsen pain as pt cannot always stop and rest when pain starts to worsen.  Continue meloxicam 15 mg once daily.  Refill provided.  Renal function stable.  Recheck with labs in 6 weeks.  Followup 6 months.

## 2017-11-03 ENCOUNTER — Other Ambulatory Visit: Payer: Commercial Managed Care - PPO

## 2017-11-03 DIAGNOSIS — E782 Mixed hyperlipidemia: Secondary | ICD-10-CM

## 2017-11-03 DIAGNOSIS — E119 Type 2 diabetes mellitus without complications: Secondary | ICD-10-CM

## 2017-11-03 DIAGNOSIS — E669 Obesity, unspecified: Secondary | ICD-10-CM

## 2017-11-03 NOTE — Progress Notes (Signed)
Pt came in the office today for a blood pressure check. His blood pressure at his previous visit on 10/20/17 was 148/75. Today his blood pressure is 132/78.

## 2017-11-03 NOTE — Progress Notes (Signed)
BP reviewed. No changes to medications necessary today.  Will followup as scheduled in 3-6 months.

## 2017-11-04 LAB — COMPLETE METABOLIC PANEL WITH GFR
AG Ratio: 2 (calc) (ref 1.0–2.5)
ALT: 27 U/L (ref 9–46)
AST: 25 U/L (ref 10–35)
Albumin: 4.2 g/dL (ref 3.6–5.1)
Alkaline phosphatase (APISO): 76 U/L (ref 40–115)
BUN: 10 mg/dL (ref 7–25)
CO2: 24 mmol/L (ref 20–32)
Calcium: 8.9 mg/dL (ref 8.6–10.3)
Chloride: 108 mmol/L (ref 98–110)
Creat: 0.8 mg/dL (ref 0.70–1.25)
GFR, Est African American: 111 mL/min/{1.73_m2} (ref >=60)
GFR, Est Non African American: 96 mL/min/{1.73_m2} (ref >=60)
Globulin: 2.1 g/dL (calc) (ref 1.9–3.7)
Glucose, Bld: 132 mg/dL — ABNORMAL HIGH (ref 65–99)
Potassium: 3.5 mmol/L (ref 3.5–5.3)
Sodium: 140 mmol/L (ref 135–146)
Total Bilirubin: 0.9 mg/dL (ref 0.2–1.2)
Total Protein: 6.3 g/dL (ref 6.1–8.1)

## 2017-11-04 LAB — HEMOGLOBIN A1C
Hgb A1c MFr Bld: 5.9 % of total Hgb — ABNORMAL HIGH (ref <5.7)
Mean Plasma Glucose: 123 (calc)
eAG (mmol/L): 6.8 (calc)

## 2017-11-04 LAB — LIPID PANEL
Cholesterol: 195 mg/dL (ref <200)
HDL: 43 mg/dL (ref >40)
LDL Cholesterol (Calc): 116 mg/dL (calc) — ABNORMAL HIGH
Non-HDL Cholesterol (Calc): 152 mg/dL (calc) — ABNORMAL HIGH (ref <130)
Total CHOL/HDL Ratio: 4.5 (calc) (ref <5.0)
Triglycerides: 250 mg/dL — ABNORMAL HIGH (ref <150)

## 2017-11-04 MED ORDER — EZETIMIBE 10 MG PO TABS: 10.0000 mg | ORAL_TABLET | Freq: Every day | ORAL | 6 refills | Status: DC

## 2017-11-04 NOTE — Addendum Note (Signed)
Addended by: Cassell Smiles R on: 11/04/2017 08:09 AM   Modules accepted: Orders

## 2017-11-23 ENCOUNTER — Encounter: Payer: Self-pay | Admitting: Emergency Medicine

## 2017-11-23 ENCOUNTER — Ambulatory Visit: Payer: Commercial Managed Care - PPO | Admitting: Family Medicine

## 2017-11-23 ENCOUNTER — Emergency Department: Payer: Commercial Managed Care - PPO

## 2017-11-23 ENCOUNTER — Emergency Department
Admission: EM | Admit: 2017-11-23 | Discharge: 2017-11-23 | Disposition: A | Discharge status: Home / Self Care | Payer: Commercial Managed Care - PPO | Attending: Emergency Medicine | Admitting: Emergency Medicine

## 2017-11-23 DIAGNOSIS — F17210 Nicotine dependence, cigarettes, uncomplicated: Secondary | ICD-10-CM | POA: Diagnosis not present

## 2017-11-23 DIAGNOSIS — Z79899 Other long term (current) drug therapy: Secondary | ICD-10-CM | POA: Diagnosis not present

## 2017-11-23 DIAGNOSIS — E1122 Type 2 diabetes mellitus with diabetic chronic kidney disease: Secondary | ICD-10-CM | POA: Diagnosis not present

## 2017-11-23 DIAGNOSIS — Y929 Unspecified place or not applicable: Secondary | ICD-10-CM | POA: Diagnosis not present

## 2017-11-23 DIAGNOSIS — Y9389 Activity, other specified: Secondary | ICD-10-CM | POA: Insufficient documentation

## 2017-11-23 DIAGNOSIS — X500XXA Overexertion from strenuous movement or load, initial encounter: Secondary | ICD-10-CM | POA: Insufficient documentation

## 2017-11-23 DIAGNOSIS — S39012A Strain of muscle, fascia and tendon of lower back, initial encounter: Secondary | ICD-10-CM | POA: Diagnosis not present

## 2017-11-23 DIAGNOSIS — S3992XA Unspecified injury of lower back, initial encounter: Secondary | ICD-10-CM | POA: Diagnosis present

## 2017-11-23 DIAGNOSIS — N189 Chronic kidney disease, unspecified: Secondary | ICD-10-CM | POA: Insufficient documentation

## 2017-11-23 DIAGNOSIS — Y990 Civilian activity done for income or pay: Secondary | ICD-10-CM | POA: Diagnosis not present

## 2017-11-23 LAB — URINALYSIS, COMPLETE (UACMP) WITH MICROSCOPIC
BACTERIA UA: NONE SEEN
BILIRUBIN URINE: NEGATIVE
Glucose, UA: NEGATIVE mg/dL
HGB URINE DIPSTICK: NEGATIVE
KETONES UR: NEGATIVE mg/dL
Leukocytes, UA: NEGATIVE
NITRITE: NEGATIVE
PH: 7 (ref 5.0–8.0)
Protein, ur: NEGATIVE mg/dL
Specific Gravity, Urine: 1.004 — ABNORMAL LOW (ref 1.005–1.030)
Squamous Epithelial / LPF: NONE SEEN (ref 0–5)

## 2017-11-23 LAB — CBC
HEMATOCRIT: 44.4 % (ref 40.0–52.0)
HEMOGLOBIN: 16.2 g/dL (ref 13.0–18.0)
MCH: 32.7 pg (ref 26.0–34.0)
MCHC: 36.5 g/dL — AB (ref 32.0–36.0)
MCV: 89.6 fL (ref 80.0–100.0)
Platelets: 206 10*3/uL (ref 150–440)
RBC: 4.96 MIL/uL (ref 4.40–5.90)
RDW: 13 % (ref 11.5–14.5)
WBC: 7.2 10*3/uL (ref 3.8–10.6)

## 2017-11-23 LAB — COMPREHENSIVE METABOLIC PANEL
ALBUMIN: 4.1 g/dL (ref 3.5–5.0)
ALK PHOS: 66 U/L (ref 38–126)
ALT: 31 U/L (ref 0–44)
AST: 28 U/L (ref 15–41)
Anion gap: 8 (ref 5–15)
BUN: 12 mg/dL (ref 8–23)
CHLORIDE: 108 mmol/L (ref 98–111)
CO2: 23 mmol/L (ref 22–32)
CREATININE: 0.72 mg/dL (ref 0.61–1.24)
Calcium: 8.9 mg/dL (ref 8.9–10.3)
GFR calc Af Amer: >60 mL/min (ref >60)
Glucose, Bld: 107 mg/dL — ABNORMAL HIGH (ref 70–99)
POTASSIUM: 3.9 mmol/L (ref 3.5–5.1)
Sodium: 139 mmol/L (ref 135–145)
Total Bilirubin: 1.4 mg/dL — ABNORMAL HIGH (ref 0.3–1.2)
Total Protein: 7.1 g/dL (ref 6.5–8.1)

## 2017-11-23 LAB — LIPASE, BLOOD: LIPASE: 29 U/L (ref 11–51)

## 2017-11-23 MED ORDER — TRAMADOL HCL 50 MG PO TABS: 50.0000 mg | ORAL_TABLET | Freq: Four times a day (QID) | ORAL | 0 refills | Status: DC | PRN

## 2017-11-23 MED ORDER — LIDOCAINE 5 % EX PTCH
1.0000 | MEDICATED_PATCH | CUTANEOUS | Status: DC
  Administered 2017-11-23: 1 via TRANSDERMAL
  Filled 2017-11-23: qty 1

## 2017-11-23 MED ORDER — LIDOCAINE 5 % EX PTCH: 1.0000 | MEDICATED_PATCH | CUTANEOUS | 0 refills | Status: DC

## 2017-11-23 MED ORDER — OXYCODONE-ACETAMINOPHEN 5-325 MG PO TABS
1.0000 | ORAL_TABLET | Freq: Once | ORAL | Status: AC
  Administered 2017-11-23: 1 via ORAL
  Filled 2017-11-23: qty 1

## 2017-11-23 MED ORDER — CYCLOBENZAPRINE HCL 5 MG PO TABS: ORAL_TABLET | ORAL | 0 refills | Status: DC

## 2017-11-23 NOTE — ED Provider Notes (Signed)
[facility] Emergency Department Provider Note  ____________________________________________  Time seen: Approximately 10:21 AM  I have reviewed the triage vital signs and the nursing notes.   HISTORY  Chief Complaint Back Pain    HPI Craig Bruce is a 62 y.o. male that presents to the emergency department for evaluation of low back pain for 1 day.  Patient has been working extended hours and doing a lot of heavy lifting. He estimates lifting >100lbs frequently the last couple of days.  He was riding his lawnmower yesterday, but had to stop due to pain.  Pain is across his low back and does not radiate. No specific trauma.  No bowel or bladder dysfunction or saddle paresthesias.  He had kidney stone several years ago.  Patient took an Advil and Sleep Aid last night.  No fever, chills, urinary symptoms.   Past Medical History:  Diagnosis Date  . Annual physical exam 03/24/2015  . Arthritis    BOTH HANDS  . Chronic kidney disease    H/O KIDNEY STONES  . Diabetes mellitus type 2, controlled, without complications ([place]) 06/07/7260  . DVT (deep venous thrombosis) ([place]) 1996  . Gastritis   . Immunization due 03/24/2015  . Need for influenza vaccination 03/24/2015  . Obesity (BMI 30.0-34.9) 02/17/2015  . Smoker 02/17/2015    Patient Active Problem List   Diagnosis Date Noted  . Post-traumatic osteoarthritis of left knee 10/21/2017  . Other insomnia 04/22/2017  . Mixed hyperlipidemia 04/11/2017  . Obesity (BMI 30.0-34.9) 02/17/2015  . Smoker 02/17/2015  . Diet-controlled type 2 diabetes mellitus ([place]) 02/17/2015    Past Surgical History:  Procedure Laterality Date  . ORCHIECTOMY Right 06/25/2015   Procedure: ORCHIECTOMY;  Surgeon: Nickie Retort, MD;  Location: [facility] ORS;  Service: Urology;  Laterality: Right;  . WISDOM TOOTH EXTRACTION      Prior to Admission medications   Medication Sig Start Date End Date Taking? Authorizing Provider   atorvastatin (LIPITOR) 80 MG tablet Take 1 tablet (80 mg total) by mouth daily. 10/20/17   Mikey College, NP  cyclobenzaprine (FLEXERIL) 5 MG tablet Take 1-2 tablets 3 times daily as needed 11/23/17   Laban Emperor, PA-C  ezetimibe (ZETIA) 10 MG tablet Take 1 tablet (10 mg total) by mouth daily. 11/04/17   Mikey College, NP  lidocaine (LIDODERM) 5 % Place 1 patch onto the skin daily. Remove & Discard patch within 12 hours or as directed by MD 11/23/17   Laban Emperor, PA-C  lisinopril (PRINIVIL,ZESTRIL) 5 MG tablet Take 1 tablet (5 mg total) by mouth daily. 10/20/17   Mikey College, NP  meloxicam (MOBIC) 15 MG tablet Take 1 tablet (15 mg total) by mouth daily. 10/20/17   Mikey College, NP  traMADol (ULTRAM) 50 MG tablet Take 1 tablet (50 mg total) by mouth every 6 (six) hours as needed. 11/23/17 11/23/18  Laban Emperor, PA-C    Allergies Other  Family History  Problem Relation Age of Onset  . Prostate cancer Neg Hx   . Kidney cancer Neg Hx   . Kidney failure Neg Hx     Social History Social History   Tobacco Use  . Smoking status: Current Every Day Smoker    Packs/day: 0.50    Years: 20.00    Pack years: 10.00    Types: Cigarettes  . Smokeless tobacco: Never Used  . Tobacco comment: Previously smoked 1ppd.  Cutting back and down from 5 cigs / day to 4  cigs per day in 4 weeks.  Substance Use Topics  . Alcohol use: Yes    Alcohol/week: 1.2 oz    Types: 2 Cans of beer per week    Comment: BEER OCC  . Drug use: No     Review of Systems  Constitutional: No fever/chills Respiratory:  No SOB. Gastrointestinal: No abdominal pain.  No nausea, no vomiting.  Genitourinary: Negative for dysuria, urgency, frequency, hematuria. Musculoskeletal: Positive for back pain.  Skin: Negative for rash, abrasions, lacerations, ecchymosis. Neurological: Negative for headaches, numbness or tingling   ____________________________________________   PHYSICAL  EXAM:  VITAL SIGNS: ED Triage Vitals  Enc Vitals Group     BP 11/23/17 0933 131/82     Pulse Rate 11/23/17 0933 71     Resp --      Temp 11/23/17 0933 98.6 F (37 C)     Temp Source 11/23/17 0933 Oral     SpO2 11/23/17 0933 98 %     Weight 11/23/17 0934 220 lb (99.8 kg)     Height 11/23/17 0934 5\' 8"  (1.727 m)     Head Circumference --      Peak Flow --      Pain Score 11/23/17 0948 10     Pain Loc --      Pain Edu? --      Excl. in [place]? --      Constitutional: Alert and oriented. Well appearing and in no acute distress. Eyes: Conjunctivae are normal. PERRL. EOMI. Head: Atraumatic. ENT:      Ears:      Nose: No congestion/rhinnorhea.      Mouth/Throat: Mucous membranes are moist.  Neck: No stridor.  Cardiovascular: Normal rate, regular rhythm.  Good peripheral circulation. Respiratory: Normal respiratory effort without tachypnea or retractions. Lungs CTAB. Good air entry to the bases with no decreased or absent breath sounds. Gastrointestinal: Bowel sounds 4 quadrants. Soft and nontender to palpation. No guarding or rigidity. No palpable masses. No distention. No CVA tenderness. Musculoskeletal: Full range of motion to all extremities. No gross deformities appreciated.  Tenderness to palpation over lumbar paraspinal muscles.  No tenderness to palpation over lumbar spine.  Strength and sensation equal in lower extremities bilaterally. Neurologic:  Normal speech and language. No gross focal neurologic deficits are appreciated.  Skin:  Skin is warm, dry and intact. No rash noted. Psychiatric: Mood and affect are normal. Speech and behavior are normal. Patient exhibits appropriate insight and judgement.   ____________________________________________   LABS (all labs ordered are listed, but only abnormal results are displayed)  Labs Reviewed  URINALYSIS, COMPLETE (UACMP) WITH MICROSCOPIC - Abnormal; Notable for the following components:      Result Value   Color, Urine  STRAW (*)    APPearance CLEAR (*)    Specific Gravity, Urine 1.004 (*)    All other components within normal limits  CBC - Abnormal; Notable for the following components:   MCHC 36.5 (*)    All other components within normal limits  COMPREHENSIVE METABOLIC PANEL - Abnormal; Notable for the following components:   Glucose, Bld 107 (*)    Total Bilirubin 1.4 (*)    All other components within normal limits  LIPASE, BLOOD   ____________________________________________  EKG   ____________________________________________  RADIOLOGY Robinette Haines, personally viewed and evaluated these images (plain radiographs) as part of my medical decision making, as well as reviewing the written report by the radiologist.  Dg Lumbar Spine Complete  Result Date: 11/23/2017 CLINICAL  DATA:  Acute low back pain since yesterday. No radiculopathy. Heavy lifting at work. EXAM: LUMBAR SPINE - COMPLETE 4+ VIEW COMPARISON:  CT abdomen pelvis dated May 19, 2013. FINDINGS: Five lumbar type vertebral bodies. No acute fracture or subluxation. Vertebral body heights are preserved. Trace retrolisthesis at L3-L4. Large flowing anterior endplate osteophytes in the lower thoracic and lumbar spine. Intervertebral disc heights are preserved. Mild lumbar facet arthropathy. The sacroiliac joints are unremarkable. Unchanged calcified gallstone in the right upper quadrant. IMPRESSION: 1.  No acute osseous abnormality. 2. Diffuse idiopathic skeletal hyperostosis. 3. Cholelithiasis. Electronically Signed   By: Titus Dubin M.D.   On: 11/23/2017 10:50    ____________________________________________    PROCEDURES  Procedure(s) performed:    Procedures    Medications  oxyCODONE-acetaminophen (PERCOCET/ROXICET) 5-325 MG per tablet 1 tablet (1 tablet Oral Given 11/23/17 1024)     ____________________________________________   INITIAL IMPRESSION / ASSESSMENT AND PLAN / ED COURSE  Pertinent labs & imaging  results that were available during my care of the patient were reviewed by me and considered in my medical decision making (see chart for details).  Review of the [place] CSRS was performed in accordance of the [place] prior to dispensing any controlled drugs.     Patient's diagnosis is consistent with lumbar strain.  Vital signs and exam are reassuring.  Lumbar x-ray negative for acute bony abnormalities.  No infection or blood on urinalysis. Pain improved with Percocet.  Patient then stated that he took an Advil and a sleep aid last night.  Patient then said that back pain was starting to radiate to his sides after having to move a lot for xray so blood work was ordered. Labwork is largely normal.    Patient will be discharged home with prescriptions for tramadol, flexeril, lidoderm. Patient already takes Meloxicam daily. We discussed good body mechanics. Patient is to follow up with PCP as directed. Patient is given ED precautions to return to the ED for any worsening or new symptoms.     ____________________________________________  FINAL CLINICAL IMPRESSION(S) / ED DIAGNOSES  Final diagnoses:  Strain of lumbar region, initial encounter      NEW MEDICATIONS STARTED DURING THIS VISIT:  ED Discharge Orders        Ordered    cyclobenzaprine (FLEXERIL) 5 MG tablet     11/23/17 1148    traMADol (ULTRAM) 50 MG tablet  Every 6 hours PRN     11/23/17 1148    lidocaine (LIDODERM) 5 %  Every 24 hours     11/23/17 1149          This chart was dictated using voice recognition software/Dragon. Despite best efforts to proofread, errors can occur which can change the meaning. Any change was purely unintentional.    Laban Emperor, PA-C 11/23/17 1536    Earleen Newport, MD 11/23/17 1540

## 2017-11-23 NOTE — ED Notes (Signed)
See triage note  States he developed lower back pain yesterday   States pain radiates across lower back but not into legs  Denies any n/v or urinary sx  States he took 3 Aleve and 1 sleep aid last pm  With min relief

## 2017-11-23 NOTE — ED Notes (Signed)
First Nurse Note:  Patient to ED Lobby via [facility] complaining of back pain and difficulty walking.

## 2017-11-23 NOTE — ED Triage Notes (Signed)
Pt reports lower back pain since yesterday. Pt reports pt has worked extended hours and doing some heavy lifting. Pt denies urinary sx's.

## 2017-11-25 ENCOUNTER — Ambulatory Visit (INDEPENDENT_AMBULATORY_CARE_PROVIDER_SITE_OTHER): Payer: Commercial Managed Care - PPO | Admitting: Nurse Practitioner

## 2017-11-25 ENCOUNTER — Encounter: Payer: Self-pay | Admitting: Nurse Practitioner

## 2017-11-25 VITALS — BP 120/84 | HR 92 | Resp 16 | Ht 68.0 in | Wt 218.0 lb

## 2017-11-25 DIAGNOSIS — M5441 Lumbago with sciatica, right side: Secondary | ICD-10-CM

## 2017-11-25 MED ORDER — TRAMADOL HCL 50 MG PO TABS: 50.0000 mg | ORAL_TABLET | Freq: Four times a day (QID) | ORAL | 0 refills | Status: DC | PRN

## 2017-11-25 MED ORDER — TRAMADOL HCL 50 MG PO TABS: 50.0000 mg | ORAL_TABLET | Freq: Four times a day (QID) | ORAL | 0 refills | Status: AC | PRN

## 2017-11-25 MED ORDER — CYCLOBENZAPRINE HCL 5 MG PO TABS: 5.0000 mg | ORAL_TABLET | Freq: Three times a day (TID) | ORAL | 1 refills | Status: DC | PRN

## 2017-11-25 NOTE — Progress Notes (Signed)
Subjective:    Patient ID: Craig Bruce, male    DOB: 08-30-1955, 62 y.o.   MRN: [id number]  Craig Bruce is a 62 y.o. male presenting on 11/25/2017 for No chief complaint on file.   HPI ED Followup Visit Hospital/Location: [place] Date of ED Visit: 11/23/2017 Reason for Presenting to ED: Low back pain Primary (+Secondary) Diagnosis: lumbar muscle strain  FOLLOW-UP Low Back Pain, lumbar muscle strain - ED provider note and record have been reviewed - Patient presents today about 2 days after recent ED visit. Brief summary of recent course, patient had symptoms of low back pain after he woke up from his daytime sleeping (he works 3rd shift), presented to ED on 11/23/2017, testing in ED included Xray, treated with tramadol and cyclobenzaprine.  He reports the pain is likely from overuse as he was moving too many case boxes ([employer] department).  Is continuing to have muscle spasm and sharp pain radiating through R lateral thigh. - Today reports overall has done well after discharge from ED. Symptoms of low back pain have improved, but always worsen first thing in the morning before getting moving again.   - New medications on discharge: cyclobenzaprine, tramadol, lidocaine 5% gel - Changes to current meds on discharge: none  Social History   Tobacco Use  . Smoking status: Current Every Day Smoker    Packs/day: 0.50    Years: 20.00    Pack years: 10.00    Types: Cigarettes  . Smokeless tobacco: Never Used  . Tobacco comment: Previously smoked 1ppd.  Cutting back and down from 5 cigs / day to 4 cigs per day in 4 weeks.  Substance Use Topics  . Alcohol use: Yes    Alcohol/week: 1.2 oz    Types: 2 Cans of beer per week    Comment: BEER OCC  . Drug use: No    Review of Systems Per HPI unless specifically indicated above     Objective:    There were no vitals taken for this visit.  Wt Readings from Last 3 Encounters:  11/23/17 220 lb (99.8 kg)  10/20/17 211 lb 3.2 oz (95.8  kg)  07/21/17 212 lb 12.8 oz (96.5 kg)    Physical Exam  Constitutional: He is oriented to person, place, and time. He appears well-developed and well-nourished. No distress.  HENT:  Head: Normocephalic and atraumatic.  Cardiovascular: Normal rate, regular rhythm, S1 normal, S2 normal, normal heart sounds and intact distal pulses.  Pulmonary/Chest: Effort normal and breath sounds normal. No respiratory distress.  Musculoskeletal:  Low Back Inspection: Normal appearance, Large body habitus, no spinal deformity, symmetrical. Palpation: No tenderness over spinous processes, Bilateral lumbar paraspinal muscles tender and with hypertonicity/spasm. ROM: Full active ROM forward flex / back extension, rotation L/R without discomfort Special Testing: Seated SLR positive for radicular pain R lateral thigh and with reproduced midline back pain. Strength: Bilateral hip flex/ext 5/5, knee flex/ext 5/5, ankle dorsiflex/plantarflex 5/5 Neurovascular: intact distal sensation to light touch  Neurological: He is alert and oriented to person, place, and time.  Skin: Skin is warm and dry.  Psychiatric: He has a normal mood and affect. His behavior is normal.  Vitals reviewed.   Results for orders placed or performed during the hospital encounter of 11/23/17  Urinalysis, Complete w Microscopic  Result Value Ref Range   Color, Urine STRAW (A) YELLOW   APPearance CLEAR (A) CLEAR   Specific Gravity, Urine 1.004 (L) 1.005 - 1.030   pH 7.0  5.0 - 8.0   Glucose, UA NEGATIVE NEGATIVE mg/dL   Hgb urine dipstick NEGATIVE NEGATIVE   Bilirubin Urine NEGATIVE NEGATIVE   Ketones, ur NEGATIVE NEGATIVE mg/dL   Protein, ur NEGATIVE NEGATIVE mg/dL   Nitrite NEGATIVE NEGATIVE   Leukocytes, UA NEGATIVE NEGATIVE   WBC, UA 0-5 0 - 5 WBC/hpf   Bacteria, UA NONE SEEN NONE SEEN   Squamous Epithelial / LPF NONE SEEN 0 - 5  CBC  Result Value Ref Range   WBC 7.2 3.8 - 10.6 K/uL   RBC 4.96 4.40 - 5.90 MIL/uL   Hemoglobin  16.2 13.0 - 18.0 g/dL   HCT 44.4 40.0 - 52.0 %   MCV 89.6 80.0 - 100.0 fL   MCH 32.7 26.0 - 34.0 pg   MCHC 36.5 (H) 32.0 - 36.0 g/dL   RDW 13.0 11.5 - 14.5 %   Platelets 206 150 - 440 K/uL  Comprehensive metabolic panel  Result Value Ref Range   Sodium 139 135 - 145 mmol/L   Potassium 3.9 3.5 - 5.1 mmol/L   Chloride 108 98 - 111 mmol/L   CO2 23 22 - 32 mmol/L   Glucose, Bld 107 (H) 70 - 99 mg/dL   BUN 12 8 - 23 mg/dL   Creatinine, Ser 0.72 0.61 - 1.24 mg/dL   Calcium 8.9 8.9 - 10.3 mg/dL   Total Protein 7.1 6.5 - 8.1 g/dL   Albumin 4.1 3.5 - 5.0 g/dL   AST 28 15 - 41 U/L   ALT 31 0 - 44 U/L   Alkaline Phosphatase 66 38 - 126 U/L   Total Bilirubin 1.4 (H) 0.3 - 1.2 mg/dL   GFR calc non Af Amer >60 >60 mL/min   GFR calc Af Amer >60 >60 mL/min   Anion gap 8 5 - 15  Lipase, blood  Result Value Ref Range   Lipase 29 11 - 51 U/L      Assessment & Plan:   Problem List Items Addressed This Visit    None    Visit Diagnoses    Acute bilateral low back pain with right-sided sciatica    -  Primary   Relevant Medications   cyclobenzaprine (FLEXERIL) 5 MG tablet   traMADol (ULTRAM) 50 MG tablet    Pain likely self-limited.  Muscle strain possible complicated by heavy lifting of boxes up to 115 lbs repetitively over last several weeks at work.  Plan:  1. Treat with OTC pain meds (acetaminophen and ibuprofen).  Discussed alternate dosing and max dosing. 2. Apply heat and/or ice to affected area. 3. May also apply a muscle rub with lidocaine or lidocaine patch after heat or ice. 4. Take muscle relaxer cyclobenzaprine 5-10 mg up to three times daily.  Cautioned drowsiness. 5. Provided low back pain exercises.  Patient declined offer of physical therapy. 6. Follow up 6 weeks prn and as scheduled.    Meds ordered this encounter  Medications  . cyclobenzaprine (FLEXERIL) 5 MG tablet    Sig: Take 1-2 tablets (5-10 mg total) by mouth 3 (three) times daily as needed for muscle spasms.  Take 1-2 tablets 3 times daily as needed    Dispense:  30 tablet    Refill:  1    Order Specific Question:   Supervising Provider    Answer:   Olin Hauser [[id number]]  . traMADol (ULTRAM) 50 MG tablet    Sig: Take 1 tablet (50 mg total) by mouth every 6 (six) hours as needed for  up to 5 days.    Dispense:  20 tablet    Refill:  0    Order Specific Question:   Supervising Provider    Answer:   Olin Hauser [[id number]]    I have reviewed the discharge medication list, and have reconciled the current and discharge medications today.  Follow up plan: Return if symptoms worsen or fail to improve AND as scheduled.  Cassell Smiles, DNP, AGPCNP-BC Adult Gerontology Primary Care Nurse Practitioner [facility] 11/25/2017, 3:16 PM

## 2017-11-25 NOTE — Patient Instructions (Addendum)
Craig Bruce,   Thank you for coming in to clinic today.  1. Continue your medications without changes.  Use Tramadol only up to 5 more days.  2. If you need a letter for work to be out through July 10th we will provide this.  Please schedule a follow-up appointment with Cassell Smiles, AGNP. Return if symptoms worsen or fail to improve AND as scheduled.  If you have any other questions or concerns, please feel free to call the clinic or send a message through [place]. You may also schedule an earlier appointment if necessary.  You will receive a survey after today's visit either digitally by e-mail or paper by [employer]. Your experiences and feedback matter to [place].  Please respond so we know how we are doing as we provide care for you.   Cassell Smiles, DNP, AGNP-BC Adult Gerontology Nurse Practitioner [facility], [facility]  Low Back Pain Exercises See other page with pictures of each exercise.  Start with 1 or 2 of these exercises that you are most comfortable with. Do not do any exercises that cause you significant worsening pain. Some of these may cause some "stretching soreness" but it should go away after you stop the exercise, and get better over time. Gradually increase up to 3-4 exercises as tolerated.  Standing hamstring stretch: Place the heel of your leg on a stool about 15 inches high. Keep your knee straight. Lean forward, bending at the hips until you feel a mild stretch in the back of your thigh. Make sure you do not roll your shoulders and bend at the waist when doing this or you will stretch your lower back instead. Hold the stretch for 15 to 30 seconds. Repeat 3 times. Repeat the same stretch on your other leg.  Cat and camel: Get down on your hands and knees. Let your stomach sag, allowing your back to curve downward. Hold this position for 5 seconds. Then arch your back and hold for 5 seconds. Do 3 sets of 10.  Quadriped Arm/Leg Raises: Get down on your  hands and knees. Tighten your abdominal muscles to stiffen your spine. While keeping your abdominals tight, raise one arm and the opposite leg away from you. Hold this position for 5 seconds. Lower your arm and leg slowly and alternate sides. Do this 10 times on each side.  Pelvic tilt: Lie on your back with your knees bent and your feet flat on the floor. Tighten your abdominal muscles and push your lower back into the floor. Hold this position for 5 seconds, then relax. Do 3 sets of 10.  Partial curl: Lie on your back with your knees bent and your feet flat on the floor. Tighten your stomach muscles and flatten your back against the floor. Tuck your chin to your chest. With your hands stretched out in front of you, curl your upper body forward until your shoulders clear the floor. Hold this position for 3 seconds. Don't hold your breath. It helps to breathe out as you lift your shoulders up. Relax. Repeat 10 times. Build to 3 sets of 10. To challenge yourself, clasp your hands behind your head and keep your elbows out to the side.  Lower trunk rotation: Lie on your back with your knees bent and your feet flat on the floor. Tighten your abdominal muscles and push your lower back into the floor. Keeping your shoulders down flat, gently rotate your legs to one side, then the other as far  as you can. Repeat 10 to 20 times.  Single knee to chest stretch: Lie on your back with your legs straight out in front of you. Bring one knee up to your chest and grasp the back of your thigh. Pull your knee toward your chest, stretching your buttock muscle. Hold this position for 15 to 30 seconds and return to the starting position. Repeat 3 times on each side.  Double knee to chest: Lie on your back with your knees bent and your feet flat on the floor. Tighten your abdominal muscles and push your lower back into the floor. Pull both knees up to your chest. Hold for 5 seconds and repeat 10 to 20 times.

## 2018-02-26 ENCOUNTER — Other Ambulatory Visit: Payer: Self-pay

## 2018-02-26 ENCOUNTER — Emergency Department
Admission: EM | Admit: 2018-02-26 | Discharge: 2018-02-26 | Disposition: A | Discharge status: Home / Self Care | Payer: Commercial Managed Care - PPO | Attending: Emergency Medicine | Admitting: Emergency Medicine

## 2018-02-26 DIAGNOSIS — M545 Low back pain, unspecified: Secondary | ICD-10-CM

## 2018-02-26 DIAGNOSIS — F17210 Nicotine dependence, cigarettes, uncomplicated: Secondary | ICD-10-CM | POA: Insufficient documentation

## 2018-02-26 DIAGNOSIS — M5441 Lumbago with sciatica, right side: Secondary | ICD-10-CM

## 2018-02-26 DIAGNOSIS — Z79899 Other long term (current) drug therapy: Secondary | ICD-10-CM | POA: Insufficient documentation

## 2018-02-26 DIAGNOSIS — E119 Type 2 diabetes mellitus without complications: Secondary | ICD-10-CM | POA: Diagnosis not present

## 2018-02-26 LAB — COMPREHENSIVE METABOLIC PANEL
ALBUMIN: 3.9 g/dL (ref 3.5–5.0)
ALK PHOS: 67 U/L (ref 38–126)
ALT: 35 U/L (ref 0–44)
AST: 30 U/L (ref 15–41)
Anion gap: 7 (ref 5–15)
BILIRUBIN TOTAL: 0.7 mg/dL (ref 0.3–1.2)
BUN: 14 mg/dL (ref 8–23)
CALCIUM: 8.6 mg/dL — AB (ref 8.9–10.3)
CO2: 23 mmol/L (ref 22–32)
Chloride: 110 mmol/L (ref 98–111)
Creatinine, Ser: 0.84 mg/dL (ref 0.61–1.24)
GFR calc Af Amer: >60 mL/min (ref >60)
GFR calc non Af Amer: >60 mL/min (ref >60)
GLUCOSE: 145 mg/dL — AB (ref 70–99)
POTASSIUM: 3.9 mmol/L (ref 3.5–5.1)
Sodium: 140 mmol/L (ref 135–145)
Total Protein: 6.5 g/dL (ref 6.5–8.1)

## 2018-02-26 LAB — LIPASE, BLOOD: Lipase: 31 U/L (ref 11–51)

## 2018-02-26 MED ORDER — PREDNISONE 10 MG (21) PO TBPK: ORAL_TABLET | ORAL | 0 refills | Status: DC

## 2018-02-26 MED ORDER — METHYLPREDNISOLONE SODIUM SUCC 125 MG IJ SOLR
125.0000 mg | Freq: Once | INTRAMUSCULAR | Status: AC
  Administered 2018-02-26: 125 mg via INTRAVENOUS
  Filled 2018-02-26: qty 2

## 2018-02-26 MED ORDER — CYCLOBENZAPRINE HCL 5 MG PO TABS: 5.0000 mg | ORAL_TABLET | Freq: Three times a day (TID) | ORAL | 0 refills | Status: DC | PRN

## 2018-02-26 MED ORDER — CYCLOBENZAPRINE HCL 10 MG PO TABS
5.0000 mg | ORAL_TABLET | Freq: Once | ORAL | Status: AC
  Administered 2018-02-26: 5 mg via ORAL
  Filled 2018-02-26: qty 1

## 2018-02-26 NOTE — Discharge Instructions (Addendum)
The book we discussed is called Treat your own back by Delana Meyer. Please seek medical attention for any high fevers, chest pain, shortness of breath, change in behavior, persistent vomiting, bloody stool or any other new or concerning symptoms.

## 2018-02-26 NOTE — ED Provider Notes (Signed)
[facility] Emergency Department Provider Note  ____________________________________________   I have reviewed the triage vital signs and the nursing notes.   HISTORY  Chief Complaint Back pain  History limited by: Not Limited   HPI Craig Bruce is a 62 y.o. male who presents to the emergency department today because of concerns for back pain.  Patient states that he started having some pain in his right lower back 4 days ago.  He thinks it is related to his work where he will be lifting heavy objects.  Yesterday he did have the take a whole which he thinks might of exacerbated worse.  Pain is located in the right lower back and it is severe.  Worse with movement.  He states he had similar pain once in the past and was evaluated in the ED and told he had a pulled muscle.  Patient's pain does somewhat radiate into his groin which he says is not unusual since a surgery to remove his right testicle.  He gets occasional pain in that area.  Patient denies any recent change in urination or defecation.  He denies any fevers.   Per medical record review patient has a history of ER visit earlier this year with diagnosis of lumbar strain  Past Medical History:  Diagnosis Date  . Annual physical exam 03/24/2015  . Arthritis    BOTH HANDS  . Chronic kidney disease    H/O KIDNEY STONES  . Diabetes mellitus type 2, controlled, without complications ([place]) 7/85/8850  . DVT (deep venous thrombosis) ([place]) 1996  . Gastritis   . Immunization due 03/24/2015  . Need for influenza vaccination 03/24/2015  . Obesity (BMI 30.0-34.9) 02/17/2015  . Smoker 02/17/2015    Patient Active Problem List   Diagnosis Date Noted  . Post-traumatic osteoarthritis of left knee 10/21/2017  . Other insomnia 04/22/2017  . Mixed hyperlipidemia 04/11/2017  . Obesity (BMI 30.0-34.9) 02/17/2015  . Smoker 02/17/2015  . Diet-controlled type 2 diabetes mellitus ([place]) 02/17/2015    Past Surgical  History:  Procedure Laterality Date  . ORCHIECTOMY Right 06/25/2015   Procedure: ORCHIECTOMY;  Surgeon: Nickie Retort, MD;  Location: [facility] ORS;  Service: Urology;  Laterality: Right;  . WISDOM TOOTH EXTRACTION      Prior to Admission medications   Medication Sig Start Date End Date Taking? Authorizing Provider  atorvastatin (LIPITOR) 80 MG tablet Take 1 tablet (80 mg total) by mouth daily. 10/20/17   Mikey College, NP  cyclobenzaprine (FLEXERIL) 5 MG tablet Take 1-2 tablets (5-10 mg total) by mouth 3 (three) times daily as needed for muscle spasms. Take 1-2 tablets 3 times daily as needed 11/25/17   Mikey College, NP  ezetimibe (ZETIA) 10 MG tablet Take 1 tablet (10 mg total) by mouth daily. 11/04/17   Mikey College, NP  lidocaine (LIDODERM) 5 % Place 1 patch onto the skin daily. Remove & Discard patch within 12 hours or as directed by MD 11/23/17   Laban Emperor, PA-C  lisinopril (PRINIVIL,ZESTRIL) 5 MG tablet Take 1 tablet (5 mg total) by mouth daily. 10/20/17   Mikey College, NP  meloxicam (MOBIC) 15 MG tablet Take 1 tablet (15 mg total) by mouth daily. 10/20/17   Mikey College, NP    Allergies Other  Family History  Problem Relation Age of Onset  . Prostate cancer Neg Hx   . Kidney cancer Neg Hx   . Kidney failure Neg Hx     Social  History Social History   Tobacco Use  . Smoking status: Current Every Day Smoker    Packs/day: 0.50    Years: 20.00    Pack years: 10.00    Types: Cigarettes  . Smokeless tobacco: Never Used  . Tobacco comment: Previously smoked 1ppd.  Cutting back and down from 5 cigs / day to 4 cigs per day in 4 weeks.  Substance Use Topics  . Alcohol use: Yes    Alcohol/week: 2.0 standard drinks    Types: 2 Cans of beer per week    Comment: BEER OCC  . Drug use: No    Review of Systems Constitutional: No fever/chills Eyes: No visual changes. ENT: No sore throat. Cardiovascular: Denies chest pain. Respiratory:  Denies shortness of breath. Gastrointestinal: Positive for groin pain.  Genitourinary: Negative for dysuria. Musculoskeletal: Positive for right lower back pain. Skin: Negative for rash. Neurological: Negative for headaches, focal weakness or numbness.  ____________________________________________   PHYSICAL EXAM:  VITAL SIGNS: ED Triage Vitals  Enc Vitals Group     BP 02/26/18 1931 (!) 149/78     Pulse Rate 02/26/18 1931 77     Resp --      Temp 02/26/18 1931 98.6 F (37 C)     Temp Source 02/26/18 1931 Oral     SpO2 02/26/18 1931 99 %     Weight 02/26/18 1933 200 lb (90.7 kg)     Height 02/26/18 1933 5\' 9"  (1.753 m)     Head Circumference --      Peak Flow --      Pain Score 02/26/18 1933 10   Constitutional: Alert and oriented.  Eyes: Conjunctivae are normal.  ENT      Head: Normocephalic and atraumatic.      Nose: No congestion/rhinnorhea.      Mouth/Throat: Mucous membranes are moist.      Neck: No stridor. Hematological/Lymphatic/Immunilogical: No cervical lymphadenopathy. Cardiovascular: Normal rate, regular rhythm.  No murmurs, rubs, or gallops.  Respiratory: Normal respiratory effort without tachypnea nor retractions. Breath sounds are clear and equal bilaterally. No wheezes/rales/rhonchi. Gastrointestinal: Soft and non tender. No rebound. No guarding.  Genitourinary: Deferred Musculoskeletal: Normal range of motion in all extremities. No lower extremity edema. No midline spinal tenderness. Neurologic:  Normal speech and language. No gross focal neurologic deficits are appreciated.  Skin:  Skin is warm, dry and intact. No rash noted. Psychiatric: Mood and affect are normal. Speech and behavior are normal. Patient exhibits appropriate insight and judgment.  ____________________________________________    LABS (pertinent positives/negatives)  CBC wbc 5.3, hgb 16.1, plt 193 Lipase 31 CMP wnl except glu 145, ca  8.6  ____________________________________________   EKG  None  ____________________________________________    RADIOLOGY  None  ____________________________________________   PROCEDURES  Procedures  ____________________________________________   INITIAL IMPRESSION / ASSESSMENT AND PLAN / ED COURSE  Pertinent labs & imaging results that were available during my care of the patient were reviewed by me and considered in my medical decision making (see chart for details).   Patient presented to the emergency department today because of concerns for right lower back pain patient has had lumbar strain in the past.  Does state that he did some heavy lifting at work and had to did get a hold recently.  Patient's pain is very positional.  This point no weakness or numbness in the lower extremities urinary or defecation changes to suggest cord lesion.  Patient without any leukocytosis to suggest significant intra-abdominal infection.  This  point I think mechanical cause of the patient's pain is most likely.  Discussed this with the patient.  Will give patient muscle relaxers.   ____________________________________________   FINAL CLINICAL IMPRESSION(S) / ED DIAGNOSES  Final diagnoses:  Acute right-sided low back pain without sciatica     Note: This dictation was prepared with Dragon dictation. Any transcriptional errors that result from this process are unintentional     Nance Pear, MD 02/27/18 1544

## 2018-02-26 NOTE — ED Triage Notes (Signed)
Pt brought in by [facility].  Pt states he is experiencing right lower groin radiating to his lower back.  Pain started on Tuesday in groin and then on Wednesday back began to hurt.

## 2018-02-27 ENCOUNTER — Ambulatory Visit (INDEPENDENT_AMBULATORY_CARE_PROVIDER_SITE_OTHER): Payer: Commercial Managed Care - PPO | Admitting: Nurse Practitioner

## 2018-02-27 DIAGNOSIS — F17200 Nicotine dependence, unspecified, uncomplicated: Secondary | ICD-10-CM

## 2018-02-27 LAB — CBC WITH DIFFERENTIAL/PLATELET
BASOS ABS: 0 10*3/uL (ref 0–0.1)
Basophils Relative: 0 %
Eosinophils Absolute: 0.1 10*3/uL (ref 0–0.7)
Eosinophils Relative: 2 %
HCT: 43.5 % (ref 40.0–52.0)
HEMOGLOBIN: 16.1 g/dL (ref 13.0–18.0)
Lymphocytes Relative: 31 %
Lymphs Abs: 1.6 10*3/uL (ref 1.0–3.6)
MCH: 33.4 pg (ref 26.0–34.0)
MCHC: 36.9 g/dL — ABNORMAL HIGH (ref 32.0–36.0)
MCV: 90.5 fL (ref 80.0–100.0)
Monocytes Absolute: 0.5 10*3/uL (ref 0.2–1.0)
Monocytes Relative: 9 %
NEUTROS ABS: 3 10*3/uL (ref 1.4–6.5)
NEUTROS PCT: 58 %
Platelets: 193 10*3/uL (ref 150–440)
RBC: 4.81 MIL/uL (ref 4.40–5.90)
RDW: 12.9 % (ref 11.5–14.5)
WBC: 5.3 10*3/uL (ref 3.8–10.6)

## 2018-03-02 ENCOUNTER — Encounter: Payer: Self-pay | Admitting: Nurse Practitioner

## 2018-03-02 ENCOUNTER — Ambulatory Visit (INDEPENDENT_AMBULATORY_CARE_PROVIDER_SITE_OTHER): Payer: Commercial Managed Care - PPO | Admitting: Nurse Practitioner

## 2018-03-02 ENCOUNTER — Other Ambulatory Visit: Payer: Self-pay

## 2018-03-02 VITALS — BP 159/85 | HR 67 | Temp 98.5°F | Ht 69.0 in | Wt 215.0 lb

## 2018-03-02 DIAGNOSIS — K5901 Slow transit constipation: Secondary | ICD-10-CM | POA: Diagnosis not present

## 2018-03-02 DIAGNOSIS — M545 Low back pain, unspecified: Secondary | ICD-10-CM

## 2018-03-02 LAB — POCT URINALYSIS DIPSTICK
Bilirubin, UA: NEGATIVE
Blood, UA: NEGATIVE
Glucose, UA: NEGATIVE
Ketones, UA: NEGATIVE
Leukocytes, UA: NEGATIVE
Nitrite, UA: NEGATIVE
Odor: NEGATIVE
Protein, UA: NEGATIVE
Spec Grav, UA: 1.01 (ref 1.010–1.025)
Urobilinogen, UA: 0.2 E.U./dL
pH, UA: 5 (ref 5.0–8.0)

## 2018-03-02 NOTE — Patient Instructions (Addendum)
Craig Bruce,   Thank you for coming in to clinic today.  1. Constipation - Miralax - take 1/2 dose daily until you have a bowel movement.  - Drink plenty of water to keep stool soft.  2. You have a lumbar muscle strain. Likely caused by repetitive motions, regular lifting. - Start taking Tylenol extra strength 1 to 2 tablets every 6-8 hours for aches or fever/chills for next few days as needed.  Do not take more than 3,000 mg in 24 hours from all medicines.   - Continue your meloxicam.   - Finish prednisone taper. - Use heat and ice.  Apply this for 15 minutes at a time 6-8 times per day.   - Muscle rub with lidocaine, lidocaine patch, Biofreeze, or tiger balm for topical pain relief.  Avoid using this with heat and ice to avoid burns. - Continue muscle relaxer Flexeril 10 mg. - Consider physical therapy to prevent future injury.  Please schedule a follow-up appointment with Cassell Smiles, AGNP. Return if symptoms worsen or fail to improve.  If you have any other questions or concerns, please feel free to call the clinic or send a message through [place]. You may also schedule an earlier appointment if necessary.  You will receive a survey after today's visit either digitally by e-mail or paper by [employer]. Your experiences and feedback matter to [place].  Please respond so we know how we are doing as we provide care for you.   Cassell Smiles, DNP, AGNP-BC Adult Gerontology Nurse Practitioner [facility], [facility]  Low Back Pain Exercises See other page with pictures of each exercise.  Start with 1 or 2 of these exercises that you are most comfortable with. Do not do any exercises that cause you significant worsening pain. Some of these may cause some "stretching soreness" but it should go away after you stop the exercise, and get better over time. Gradually increase up to 3-4 exercises as tolerated.  Standing hamstring stretch: Place the heel of your leg on a stool  about 15 inches high. Keep your knee straight. Lean forward, bending at the hips until you feel a mild stretch in the back of your thigh. Make sure you do not roll your shoulders and bend at the waist when doing this or you will stretch your lower back instead. Hold the stretch for 15 to 30 seconds. Repeat 3 times. Repeat the same stretch on your other leg.  Cat and camel: Get down on your hands and knees. Let your stomach sag, allowing your back to curve downward. Hold this position for 5 seconds. Then arch your back and hold for 5 seconds. Do 3 sets of 10.  Quadriped Arm/Leg Raises: Get down on your hands and knees. Tighten your abdominal muscles to stiffen your spine. While keeping your abdominals tight, raise one arm and the opposite leg away from you. Hold this position for 5 seconds. Lower your arm and leg slowly and alternate sides. Do this 10 times on each side.  Pelvic tilt: Lie on your back with your knees bent and your feet flat on the floor. Tighten your abdominal muscles and push your lower back into the floor. Hold this position for 5 seconds, then relax. Do 3 sets of 10.  Partial curl: Lie on your back with your knees bent and your feet flat on the floor. Tighten your stomach muscles and flatten your back against the floor. Tuck your chin to your chest. With your hands  stretched out in front of you, curl your upper body forward until your shoulders clear the floor. Hold this position for 3 seconds. Don't hold your breath. It helps to breathe out as you lift your shoulders up. Relax. Repeat 10 times. Build to 3 sets of 10. To challenge yourself, clasp your hands behind your head and keep your elbows out to the side.  Lower trunk rotation: Lie on your back with your knees bent and your feet flat on the floor. Tighten your abdominal muscles and push your lower back into the floor. Keeping your shoulders down flat, gently rotate your legs to one side, then the other as far as you can. Repeat 10  to 20 times.  Single knee to chest stretch: Lie on your back with your legs straight out in front of you. Bring one knee up to your chest and grasp the back of your thigh. Pull your knee toward your chest, stretching your buttock muscle. Hold this position for 15 to 30 seconds and return to the starting position. Repeat 3 times on each side.  Double knee to chest: Lie on your back with your knees bent and your feet flat on the floor. Tighten your abdominal muscles and push your lower back into the floor. Pull both knees up to your chest. Hold for 5 seconds and repeat 10 to 20 times.

## 2018-03-02 NOTE — Progress Notes (Signed)
Subjective:    Patient ID: Craig Bruce, male    DOB: 1956-01-03, 62 y.o.   MRN: [id number]  Craig Bruce is a 62 y.o. male presenting on 03/02/2018 for Back Pain (Acute right-sided low back pain without sciatica, abdominal pain. Pt reports that he had to go and dig hole to bury his cat the night before. The pt was seen in the ER and put on medication. Symptoms improved)   HPI Lumbar back pain Patient was seen in the emergency room on 02/26/2018.  He was diagnosed with acute right-sided low back pain with without sciatica.  He was started on prednisone 6-day taper, Flexeril 5-10 mg 3 times daily as needed muscles.  Since 02/26/2018, patient notes significant improvement in back pain.  He continues to have some right-sided localized pain with certain movements including lifting right leg repetitive bending.  He continues to be required to lift objects at work.  Precipitating factor was likely new task of digging a hole.  Patient has no new concerns about his back pain today.  Continues to deny loss of bowel and bladder function.  Constipation Patient also notes mild constipation.  He has purchased MiraLAX but has not taken it yet.  He also drink prune juice this morning.  This is not of great concern for patient today as he expects this to resolve without medical assistance.  Social History   Tobacco Use  . Smoking status: Current Every Day Smoker    Packs/day: 0.50    Years: 20.00    Pack years: 10.00    Types: Cigarettes  . Smokeless tobacco: Never Used  . Tobacco comment: Previously smoked 1ppd.  Cutting back and down from 5 cigs / day to 4 cigs per day in 4 weeks.  Substance Use Topics  . Alcohol use: Yes    Alcohol/week: 2.0 standard drinks    Types: 2 Cans of beer per week    Comment: BEER OCC  . Drug use: No    Review of Systems Per HPI unless specifically indicated above     Objective:    BP (!) 159/85 (BP Location: Left Arm, Patient Position: Sitting, Cuff Size:  Normal)   Pulse 67   Temp 98.5 F (36.9 C) (Oral)   Ht 5\' 9"  (1.753 m)   Wt 215 lb (97.5 kg)   BMI 31.75 kg/m   Wt Readings from Last 3 Encounters:  03/02/18 215 lb (97.5 kg)  02/26/18 200 lb (90.7 kg)  11/25/17 218 lb (98.9 kg)    Physical Exam  Constitutional: He is oriented to person, place, and time. He appears well-developed and well-nourished. No distress.  HENT:  Head: Normocephalic and atraumatic.  Cardiovascular: Normal rate, regular rhythm, S1 normal, S2 normal, normal heart sounds and intact distal pulses.  Pulmonary/Chest: Effort normal and breath sounds normal. No respiratory distress.  Musculoskeletal:  Low Back Inspection: Normal appearance, Large body habitus, no spinal deformity, symmetrical. Palpation: No tenderness over spinous processes. Bilateral lumbar paraspinal muscles non-tender and with mild hypertonicity.  No spasm. ROM: Full active ROM forward flex / back extension, rotation L/R .  Mild discomfort with back extension, R rotation. Special Testing: Seated SLR negative for radicular pain bilaterally.  Seated SLR with  R leg creates reproduced localized R-sided back pain. Strength: Bilateral hip flex/ext 5/5, knee flex/ext 5/5, ankle dorsiflex/plantarflex 5/5 Neurovascular: intact distal sensation to light touch   Neurological: He is alert and oriented to person, place, and time.  Skin: Skin is warm  and dry.  Psychiatric: He has a normal mood and affect. His behavior is normal.  Vitals reviewed.    Results for orders placed or performed in visit on 03/02/18  POCT Urinalysis Dipstick  Result Value Ref Range   Color, UA light yellow    Clarity, UA clear    Glucose, UA Negative Negative   Bilirubin, UA negative    Ketones, UA negative    Spec Grav, UA 1.010 1.010 - 1.025   Blood, UA negative    pH, UA 5.0 5.0 - 8.0   Protein, UA Negative Negative   Urobilinogen, UA 0.2 0.2 or 1.0 E.U./dL   Nitrite, UA negative    Leukocytes, UA Negative Negative    Appearance clear    Odor negative       Assessment & Plan:   Problem List Items Addressed This Visit    None    Visit Diagnoses    Acute right-sided low back pain without sciatica    -  Primary   Relevant Orders   POCT Urinalysis Dipstick (Completed)   Slow transit constipation        # Pain likely self-limited.  Muscle strain possible complicated by repetitive movements and regular lifting of heavy objects.  No new concerns for patient today.  No evidence of spinal cord complications.  Plan:  1. Treat with OTC pain meds (acetaminophen) and meloxicam.  Discussed alternate dosing and max dosing. - Reduce or avoid meloxicam with remainder of prednisone. 2. Apply heat and/or ice to affected area. 3. May also apply a muscle rub with lidocaine or lidocaine patch after heat or ice. 4. Continue flexeril 5-10 mg po three times daily prn.  Cautioned drowsiness. 5. Provided back pain exercises today. 6. Recommend physical therapy 7. Follow up 2-4 weeks prn   # Constipation Likely self-limited.  Patient has had an adequate conservative therapy to date. -Recommended that he consider starting half dose of MiraLAX daily until has bowel movement.  May repeat as needed. -Follow-up PRN  Follow up plan: Return if symptoms worsen or fail to improve.  Cassell Smiles, DNP, AGPCNP-BC Adult Gerontology Primary Care Nurse Practitioner [facility] 03/02/2018, 10:04 AM

## 2018-03-15 LAB — HM DIABETES EYE EXAM

## 2018-04-19 NOTE — Progress Notes (Signed)
error 

## 2018-04-24 ENCOUNTER — Ambulatory Visit (INDEPENDENT_AMBULATORY_CARE_PROVIDER_SITE_OTHER): Payer: Commercial Managed Care - PPO | Admitting: Nurse Practitioner

## 2018-04-24 ENCOUNTER — Other Ambulatory Visit: Payer: Self-pay

## 2018-04-24 ENCOUNTER — Encounter: Payer: Self-pay | Admitting: Nurse Practitioner

## 2018-04-24 VITALS — BP 131/81 | HR 79 | Temp 97.9°F | Ht 69.0 in | Wt 205.4 lb

## 2018-04-24 DIAGNOSIS — E119 Type 2 diabetes mellitus without complications: Secondary | ICD-10-CM

## 2018-04-24 LAB — POCT GLYCOSYLATED HEMOGLOBIN (HGB A1C): Hemoglobin A1C: 6.1 % — AB (ref 4.0–5.6)

## 2018-04-24 NOTE — Progress Notes (Signed)
Subjective:    Patient ID: Craig Bruce, male    DOB: [birth year], 62 y.o.   MRN: [id number]  Craig Bruce is a 62 y.o. male presenting on 04/24/2018 for Diabetes   HPI Diabetes Pt presents today for follow up of Type 2 diabetes mellitus. He is not checking fasting am CBG. - Current diabetic medications include: diet and lifestyle - He is not currently symptomatic.  - He denies polydipsia, polyphagia, polyuria, headaches, diaphoresis, shakiness, chills, pain, numbness or tingling in extremities and changes in vision.   - Clinical course has been stable. - He  reports no regular exercise routine outside of work. - His diet is moderate in salt, moderate in fat, and moderate in carbohydrates.  Reports continued decrease of oral intake without properly fitting dentures.  Is continuing to work with dentist to get this resolved.  Is likely going to need implants. - Weight trend: stable  PREVENTION: Eye exam current (within one year): yes Foot exam current (within one year): yes Lipid/ASCVD risk reduction - on statin: yes Kidney protection - on ace or arb: yes Recent Labs    07/15/17 0829 10/20/17 1401 11/03/17 0821 04/24/18 1139  HGBA1C 6.0* 6.1* 5.9* 6.1*   Smoker Patient has continued cutting back and is not completely able to quit.    Social History   Tobacco Use  . Smoking status: Current Every Day Smoker    Packs/day: 0.25    Years: 20.00    Pack years: 5.00    Types: Cigarettes  . Smokeless tobacco: Never Used  . Tobacco comment: Previously smoked 1ppd.  Cutting back and down from 5 cigs / day to 4 cigs per day in 4 weeks.  Substance Use Topics  . Alcohol use: Yes    Alcohol/week: 2.0 standard drinks    Types: 2 Cans of beer per week    Comment: BEER OCC  . Drug use: No    Review of Systems Per HPI unless specifically indicated above     Objective:    BP 131/81 (BP Location: Right Arm, Patient Position: Sitting, Cuff Size: Normal)   Pulse 79   Temp  97.9 F (36.6 C) (Oral)   Ht 5\' 9"  (1.753 m)   Wt 205 lb 6.4 oz (93.2 kg)   BMI 30.33 kg/m   Wt Readings from Last 3 Encounters:  04/24/18 205 lb 6.4 oz (93.2 kg)  03/02/18 215 lb (97.5 kg)  02/26/18 200 lb (90.7 kg)    Physical Exam  Constitutional: He is oriented to person, place, and time. He appears well-developed and well-nourished. No distress.  HENT:  Head: Normocephalic and atraumatic.  Cardiovascular: Normal rate, regular rhythm, S1 normal, S2 normal, normal heart sounds and intact distal pulses.  Pulmonary/Chest: Effort normal and breath sounds normal. No respiratory distress.  Neurological: He is alert and oriented to person, place, and time.  Skin: Skin is warm and dry. Capillary refill takes less than 2 seconds.  Psychiatric: He has a normal mood and affect. His behavior is normal. Judgment and thought content normal.  Vitals reviewed.   Diabetic Foot Exam - Simple   Simple Foot Form Diabetic Foot exam was performed with the following findings:  Yes 04/24/2018 11:38 AM  Visual Inspection No deformities, no ulcerations, no other skin breakdown bilaterally:  Yes Sensation Testing Intact to touch and monofilament testing bilaterally:  Yes Pulse Check Posterior Tibialis and Dorsalis pulse intact bilaterally:  Yes Comments      Results for orders placed  or performed in visit on 03/02/18  POCT Urinalysis Dipstick  Result Value Ref Range   Color, UA light yellow    Clarity, UA clear    Glucose, UA Negative Negative   Bilirubin, UA negative    Ketones, UA negative    Spec Grav, UA 1.010 1.010 - 1.025   Blood, UA negative    pH, UA 5.0 5.0 - 8.0   Protein, UA Negative Negative   Urobilinogen, UA 0.2 0.2 or 1.0 E.U./dL   Nitrite, UA negative    Leukocytes, UA Negative Negative   Appearance clear    Odor negative       Assessment & Plan:   Problem List Items Addressed This Visit      Endocrine   Diet-controlled type 2 diabetes mellitus ([place]) - Primary    Relevant Orders   POCT glycosylated hemoglobin (Hb A1C) (Completed)    ControlledDM with A1c 6.1% stable from 3 months ago and goal A1c < 7.0%. - No known complications or hypoglycemia.  Plan:  1. Continue current therapy: lifestyle modification 2. Encourage improved lifestyle: - low carb/low glycemic diet reinforced prior education - Increase physical activity to 30 minutes most days of the week.  Explained that increased physical activity increases body's use of sugar for energy. 3. Check fasting am CBG and bring log to next visit for review 4. Continue ASA, ACEi and Statin 5. DM Foot exam done today with normal findings.   6. Follow-up 6 months     Follow up plan: Return in about 6 months (around 10/24/2018) for diabetes, hypertension.  Cassell Smiles, DNP, AGPCNP-BC Adult Gerontology Primary Care Nurse Practitioner [facility] 04/24/2018, 11:28 AM

## 2018-04-24 NOTE — Patient Instructions (Addendum)
Craig Bruce,   Thank you for coming in to clinic today.  1. Continue your good work with diet and lifestyle for blood pressure and diabetes.  2.   Please schedule a follow-up appointment with Cassell Smiles, AGNP. Return in about 6 months (around 10/24/2018) for diabetes, hypertension.  If you have any other questions or concerns, please feel free to call the clinic or send a message through [place]. You may also schedule an earlier appointment if necessary.  You will receive a survey after today's visit either digitally by e-mail or paper by [employer]. Your experiences and feedback matter to [place].  Please respond so we know how we are doing as we provide care for you.   Cassell Smiles, DNP, AGNP-BC Adult Gerontology Nurse Practitioner [place]

## 2018-04-28 ENCOUNTER — Other Ambulatory Visit: Payer: Self-pay

## 2018-04-28 DIAGNOSIS — E782 Mixed hyperlipidemia: Secondary | ICD-10-CM

## 2018-04-28 MED ORDER — EZETIMIBE 10 MG PO TABS: 10.0000 mg | ORAL_TABLET | Freq: Every day | ORAL | 6 refills | Status: DC

## 2018-05-05 ENCOUNTER — Other Ambulatory Visit: Payer: Self-pay

## 2018-06-27 ENCOUNTER — Other Ambulatory Visit: Payer: Self-pay | Admitting: Nurse Practitioner

## 2018-06-27 DIAGNOSIS — E119 Type 2 diabetes mellitus without complications: Secondary | ICD-10-CM

## 2018-06-27 DIAGNOSIS — E782 Mixed hyperlipidemia: Secondary | ICD-10-CM

## 2018-07-25 ENCOUNTER — Other Ambulatory Visit: Payer: Self-pay | Admitting: Nurse Practitioner

## 2018-07-25 DIAGNOSIS — E119 Type 2 diabetes mellitus without complications: Secondary | ICD-10-CM

## 2018-10-24 ENCOUNTER — Other Ambulatory Visit: Payer: Self-pay

## 2018-10-24 ENCOUNTER — Telehealth: Payer: Self-pay

## 2018-10-24 DIAGNOSIS — E119 Type 2 diabetes mellitus without complications: Secondary | ICD-10-CM

## 2018-10-24 NOTE — Telephone Encounter (Signed)
Please review pt lab order. He is scheduled for a lab only visit on tomorrow. He's also scheduled for Telephone Visit with you on next Tuesday to f/u on diabetes/ hypertension.

## 2018-10-24 NOTE — Telephone Encounter (Signed)
Reviewed, signed.  Nobie Putnam, DO [facility] 10/24/2018, 12:44 PM

## 2018-10-25 ENCOUNTER — Other Ambulatory Visit: Payer: Commercial Managed Care - PPO

## 2018-10-25 ENCOUNTER — Other Ambulatory Visit: Payer: Self-pay

## 2018-10-25 DIAGNOSIS — E119 Type 2 diabetes mellitus without complications: Secondary | ICD-10-CM

## 2018-10-26 ENCOUNTER — Ambulatory Visit: Payer: Commercial Managed Care - PPO | Admitting: Nurse Practitioner

## 2018-10-26 LAB — HEMOGLOBIN A1C
Hgb A1c MFr Bld: 6 % of total Hgb — ABNORMAL HIGH (ref <5.7)
Mean Plasma Glucose: 126 (calc)
eAG (mmol/L): 7 (calc)

## 2018-10-31 ENCOUNTER — Ambulatory Visit (INDEPENDENT_AMBULATORY_CARE_PROVIDER_SITE_OTHER): Payer: Commercial Managed Care - PPO | Admitting: Family Medicine

## 2018-10-31 ENCOUNTER — Other Ambulatory Visit: Payer: Self-pay

## 2018-10-31 ENCOUNTER — Encounter: Payer: Self-pay | Admitting: Family Medicine

## 2018-10-31 DIAGNOSIS — E119 Type 2 diabetes mellitus without complications: Secondary | ICD-10-CM

## 2018-10-31 NOTE — Patient Instructions (Addendum)
Thank you for coming to the office today.  Recent Labs    11/03/17 0821 04/24/18 1139 10/25/18 0812  HGBA1C 5.9* 6.1* 6.0*   Keep up good work with blood sugar and lifestyle  DUE for FASTING BLOOD WORK (no food or drink after midnight before the lab appointment, only water or coffee without cream/sugar on the morning of)  SCHEDULE "Lab Only" visit in the morning at the clinic for lab draw in 6 MONTHS   - Make sure Lab Only appointment is at about 1 week before your next appointment, so that results will be available  For Lab Results, once available within 2-3 days of blood draw, you can can log in to MyChart online to view your results and a brief explanation. Also, we can discuss results at next follow-up visit.   Please schedule a Follow-up Appointment to: Return in about 6 months (around 05/02/2019) for Annual Physical.  If you have any other questions or concerns, please feel free to call the office or send a message through [place]. You may also schedule an earlier appointment if necessary.  Additionally, you may be receiving a survey about your experience at our office within a few days to 1 week by e-mail or mail. We value your feedback.  Nobie Putnam, DO [place]

## 2018-10-31 NOTE — Progress Notes (Signed)
Virtual Visit via Telephone The purpose of this virtual visit is to provide medical care while limiting exposure to the novel coronavirus (COVID19) for both patient and office staff.  Consent was obtained for phone visit:  Yes.   Answered questions that patient had about telehealth interaction:  Yes.   I discussed the limitations, risks, security and privacy concerns of performing an evaluation and management service by telephone. I also discussed with the patient that there may be a patient responsible charge related to this service. The patient expressed understanding and agreed to proceed.  Patient Location: Home Provider Location: [facility])  ---------------------------------------------------------------------- Chief Complaint  Patient presents with  . Diabetes    S: Reviewed CMA documentation. I have called patient and gathered additional HPI as follows:  CHRONIC DM, Type 2: Reports no concerns. He continues same lifestyle plan. CBGs: Does not check Meds: Not on medicine Currently on ACEi Lifestyle: - Diet (balanced, limiting sweets sugar)  - Exercise (regular activity and exercise) Denies hypoglycemia, polyuria, visual changes, numbness or tingling.  Denies any high risk travel to areas of current concern for COVID19. Denies any known or suspected exposure to person with or possibly with COVID19.  Denies any fevers, chills, sweats, body ache, cough, shortness of breath, sinus pain or pressure, headache, abdominal pain, diarrhea  Past Medical History:  Diagnosis Date  . Annual physical exam 03/24/2015  . Arthritis    BOTH HANDS  . Chronic kidney disease    H/O KIDNEY STONES  . Diabetes mellitus type 2, controlled, without complications ([place]) 06/25/5425  . DVT (deep venous thrombosis) ([place]) 1996  . Gastritis   . Immunization due 03/24/2015  . Need for influenza vaccination 03/24/2015  . Obesity (BMI 30.0-34.9) 02/17/2015  . Smoker 02/17/2015    Social History   Tobacco Use  . Smoking status: Current Every Day Smoker    Packs/day: 0.25    Years: 20.00    Pack years: 5.00    Types: Cigarettes  . Smokeless tobacco: Never Used  . Tobacco comment: Previously smoked 1ppd.  Cutting back and down from 5 cigs / day to 4 cigs per day in 4 weeks.  Substance Use Topics  . Alcohol use: Yes    Alcohol/week: 2.0 standard drinks    Types: 2 Cans of beer per week    Comment: BEER OCC  . Drug use: No    Current Outpatient Medications:  .  atorvastatin (LIPITOR) 40 MG tablet, TAKE 1 TABLET (40 MG TOTAL) DAILY BY MOUTH., Disp: 90 tablet, Rfl: 4 .  cyclobenzaprine (FLEXERIL) 5 MG tablet, Take 1-2 tablets (5-10 mg total) by mouth 3 (three) times daily as needed for muscle spasms. Take 1-2 tablets 3 times daily as needed, Disp: 30 tablet, Rfl: 0 .  ezetimibe (ZETIA) 10 MG tablet, Take 1 tablet (10 mg total) by mouth daily., Disp: 30 tablet, Rfl: 6 .  lidocaine (LIDODERM) 5 %, Place 1 patch onto the skin daily. Remove & Discard patch within 12 hours or as directed by MD, Disp: 30 patch, Rfl: 0 .  lisinopril (PRINIVIL,ZESTRIL) 5 MG tablet, TAKE 1 TABLET BY MOUTH EVERY DAY, Disp: 90 tablet, Rfl: 1 .  meloxicam (MOBIC) 15 MG tablet, Take 1 tablet (15 mg total) by mouth daily., Disp: 90 tablet, Rfl: 1  Depression screen [facility] 2/9 10/31/2018 10/20/2017 10/05/2016  Decreased Interest 0 0 2  Down, Depressed, Hopeless 0 0 0  PHQ - 2 Score 0 0 2  Altered sleeping - 0  3  Tired, decreased energy - 0 2  Change in appetite - 0 0  Feeling bad or failure about yourself  - 0 0  Trouble concentrating - 0 0  Moving slowly or fidgety/restless - 0 0  Suicidal thoughts - 0 0  PHQ-9 Score - 0 7    No flowsheet data found.  -------------------------------------------------------------------------- O: No physical exam performed due to remote telephone encounter.  Lab results reviewed.  Recent Results (from the past 2160 hour(s))  Hemoglobin A1c     Status:  Abnormal   Collection Time: 10/25/18  8:12 AM  Result Value Ref Range   Hgb A1c MFr Bld 6.0 (H) <5.7 % of total Hgb    Comment: For someone without known diabetes, a hemoglobin  A1c value between 5.7% and 6.4% is consistent with prediabetes and should be confirmed with a  follow-up test. . For someone with known diabetes, a value <7% indicates that their diabetes is well controlled. A1c targets should be individualized based on duration of diabetes, age, comorbid conditions, and other considerations. . This assay result is consistent with an increased risk of diabetes. . Currently, no consensus exists regarding use of hemoglobin A1c for diagnosis of diabetes for children. .    Mean Plasma Glucose 126 (calc)   eAG (mmol/L) 7.0 (calc)    -------------------------------------------------------------------------- A&P:  Problem List Items Addressed This Visit    Diet-controlled type 2 diabetes mellitus ([place]) - Primary     Well-controlled DM with A1c 6.0 on last A1c No Hypoglycemia or Hyperglycemia Complications - hyperlipidemia - increases risk of future cardiovascular complications   Plan:  1. Remain on diet controlled therapy 2. Encourage improved lifestyle - low carb, low sugar diet, reduce portion size, continue improving regular exercise 3. Continue ACEi, Statin 4. Follow-up 6 months   No orders of the defined types were placed in this encounter.   Follow-up: - Return in 6 months for Annual Physical   Patient verbalizes understanding with the above medical recommendations including the limitation of remote medical advice.  Specific follow-up and call-back criteria were given for patient to follow-up or seek medical care more urgently if needed.   - Time spent in direct consultation with patient on phone: 8 minutes  Craig Bruce, [facility] 10/31/2018, 8:16 AM

## 2018-11-29 ENCOUNTER — Other Ambulatory Visit: Payer: Self-pay | Admitting: Nurse Practitioner

## 2018-11-29 DIAGNOSIS — M1732 Unilateral post-traumatic osteoarthritis, left knee: Secondary | ICD-10-CM

## 2018-12-09 ENCOUNTER — Emergency Department
Admission: EM | Admit: 2018-12-09 | Discharge: 2018-12-09 | Disposition: A | Discharge status: Home / Self Care | Payer: Commercial Managed Care - PPO | Attending: Emergency Medicine | Admitting: Emergency Medicine

## 2018-12-09 ENCOUNTER — Emergency Department: Payer: Commercial Managed Care - PPO

## 2018-12-09 ENCOUNTER — Other Ambulatory Visit: Payer: Self-pay

## 2018-12-09 DIAGNOSIS — F17210 Nicotine dependence, cigarettes, uncomplicated: Secondary | ICD-10-CM | POA: Diagnosis not present

## 2018-12-09 DIAGNOSIS — Z20828 Contact with and (suspected) exposure to other viral communicable diseases: Secondary | ICD-10-CM | POA: Insufficient documentation

## 2018-12-09 DIAGNOSIS — F419 Anxiety disorder, unspecified: Secondary | ICD-10-CM | POA: Diagnosis not present

## 2018-12-09 DIAGNOSIS — T5891XA Toxic effect of carbon monoxide from unspecified source, accidental (unintentional), initial encounter: Secondary | ICD-10-CM | POA: Diagnosis not present

## 2018-12-09 DIAGNOSIS — R4182 Altered mental status, unspecified: Secondary | ICD-10-CM

## 2018-12-09 DIAGNOSIS — Z79899 Other long term (current) drug therapy: Secondary | ICD-10-CM | POA: Diagnosis not present

## 2018-12-09 DIAGNOSIS — N189 Chronic kidney disease, unspecified: Secondary | ICD-10-CM | POA: Diagnosis not present

## 2018-12-09 DIAGNOSIS — E119 Type 2 diabetes mellitus without complications: Secondary | ICD-10-CM | POA: Insufficient documentation

## 2018-12-09 LAB — CBC WITH DIFFERENTIAL/PLATELET
Abs Immature Granulocytes: 0.02 10*3/uL (ref 0.00–0.07)
Basophils Absolute: 0 10*3/uL (ref 0.0–0.1)
Basophils Relative: 0 %
Eosinophils Absolute: 0.1 10*3/uL (ref 0.0–0.5)
Eosinophils Relative: 1 %
HCT: 45.5 % (ref 39.0–52.0)
Hemoglobin: 16.4 g/dL (ref 13.0–17.0)
Immature Granulocytes: 0 %
Lymphocytes Relative: 29 %
Lymphs Abs: 2.2 10*3/uL (ref 0.7–4.0)
MCH: 32 pg (ref 26.0–34.0)
MCHC: 36 g/dL (ref 30.0–36.0)
MCV: 88.7 fL (ref 80.0–100.0)
Monocytes Absolute: 0.8 10*3/uL (ref 0.1–1.0)
Monocytes Relative: 10 %
Neutro Abs: 4.5 10*3/uL (ref 1.7–7.7)
Neutrophils Relative %: 60 %
Platelets: 210 10*3/uL (ref 150–400)
RBC: 5.13 MIL/uL (ref 4.22–5.81)
RDW: 12.1 % (ref 11.5–15.5)
WBC: 7.6 10*3/uL (ref 4.0–10.5)
nRBC: 0 % (ref 0.0–0.2)

## 2018-12-09 LAB — COMPREHENSIVE METABOLIC PANEL
ALT: 34 U/L (ref 0–44)
AST: 32 U/L (ref 15–41)
Albumin: 4.3 g/dL (ref 3.5–5.0)
Alkaline Phosphatase: 78 U/L (ref 38–126)
Anion gap: 9 (ref 5–15)
BUN: 11 mg/dL (ref 8–23)
CO2: 24 mmol/L (ref 22–32)
Calcium: 8.8 mg/dL — ABNORMAL LOW (ref 8.9–10.3)
Chloride: 108 mmol/L (ref 98–111)
Creatinine, Ser: 0.72 mg/dL (ref 0.61–1.24)
GFR calc Af Amer: >60 mL/min (ref >60)
GFR calc non Af Amer: >60 mL/min (ref >60)
Glucose, Bld: 120 mg/dL — ABNORMAL HIGH (ref 70–99)
Potassium: 3.6 mmol/L (ref 3.5–5.1)
Sodium: 141 mmol/L (ref 135–145)
Total Bilirubin: 1.3 mg/dL — ABNORMAL HIGH (ref 0.3–1.2)
Total Protein: 7.1 g/dL (ref 6.5–8.1)

## 2018-12-09 LAB — BLOOD GAS, ARTERIAL
Acid-Base Excess: 3.7 mmol/L — ABNORMAL HIGH (ref 0.0–2.0)
Bicarbonate: 27.7 mmol/L (ref 20.0–28.0)
FIO2: 0.21
O2 Saturation: 97.6 %
Patient temperature: 37
pCO2 arterial: 39 mmHg (ref 32.0–48.0)
pH, Arterial: 7.46 — ABNORMAL HIGH (ref 7.350–7.450)
pO2, Arterial: 93 mmHg (ref 83.0–108.0)

## 2018-12-09 LAB — URINALYSIS, COMPLETE (UACMP) WITH MICROSCOPIC
Bacteria, UA: NONE SEEN
Bilirubin Urine: NEGATIVE
Glucose, UA: NEGATIVE mg/dL
Hgb urine dipstick: NEGATIVE
Ketones, ur: NEGATIVE mg/dL
Leukocytes,Ua: NEGATIVE
Nitrite: NEGATIVE
Protein, ur: NEGATIVE mg/dL
Specific Gravity, Urine: 1.009 (ref 1.005–1.030)
Squamous Epithelial / HPF: NONE SEEN (ref 0–5)
pH: 7 (ref 5.0–8.0)

## 2018-12-09 LAB — URINE DRUG SCREEN, QUALITATIVE (ARMC ONLY)
Amphetamines, Ur Screen: NOT DETECTED
Barbiturates, Ur Screen: NOT DETECTED
Benzodiazepine, Ur Scrn: NOT DETECTED
Cannabinoid 50 Ng, Ur ~~LOC~~: NOT DETECTED
Cocaine Metabolite,Ur ~~LOC~~: NOT DETECTED
MDMA (Ecstasy)Ur Screen: NOT DETECTED
Methadone Scn, Ur: NOT DETECTED
Opiate, Ur Screen: NOT DETECTED
Phencyclidine (PCP) Ur S: NOT DETECTED
Tricyclic, Ur Screen: NOT DETECTED

## 2018-12-09 LAB — SALICYLATE LEVEL: Salicylate Lvl: <7 mg/dL (ref 2.8–30.0)

## 2018-12-09 LAB — ACETAMINOPHEN LEVEL: Acetaminophen (Tylenol), Serum: <10 ug/mL — ABNORMAL LOW (ref 10–30)

## 2018-12-09 LAB — TROPONIN I (HIGH SENSITIVITY): Troponin I (High Sensitivity): 6 ng/L (ref <18)

## 2018-12-09 LAB — CARBOXYHEMOGLOBIN - COOX: Carboxyhemoglobin: 6 % (ref 0.5–1.5)

## 2018-12-09 LAB — LIPASE, BLOOD: Lipase: 28 U/L (ref 11–51)

## 2018-12-09 LAB — ETHANOL: Alcohol, Ethyl (B): <10 mg/dL (ref <10)

## 2018-12-09 LAB — LACTIC ACID, PLASMA: Lactic Acid, Venous: 1.2 mmol/L (ref 0.5–1.9)

## 2018-12-09 LAB — SARS CORONAVIRUS 2 BY RT PCR (HOSPITAL ORDER, PERFORMED IN ~~LOC~~ HOSPITAL LAB): SARS Coronavirus 2: NEGATIVE

## 2018-12-09 NOTE — Discharge Instructions (Addendum)
Return to the ER for any new, worsening, or recurrent confusion, weakness, anxiety, or any other new or worsening symptoms that concern you.  Follow-up with your regular nurse practitioner.

## 2018-12-09 NOTE — ED Triage Notes (Signed)
Pt's co workers called ems for altered mental status. Pt is currently alert, oriented x4. Pt states "I just don't feel right". Pt has delayed response to directions, but does perform as requested. Pt states "I feel like I can't go fast enough for them, do this do that, sometimes I just want to walk out". Ems with fsbs of 132. Pt denies pain.

## 2018-12-09 NOTE — ED Notes (Addendum)
Pt states he is ready to go and his ride has been waiting for the past 1.5 hours. Dr. Cherylann Banas notified. Pt upset, ready to go home, does not want to wait for psychiatry to see him. No distress noted at this time.

## 2018-12-09 NOTE — ED Provider Notes (Signed)
[facility] Emergency Department Provider Note   ____________________________________________   First MD Initiated Contact with Patient 12/09/18 [phone]     (approximate)  I have reviewed the triage vital signs and the nursing notes.   HISTORY  Chief Complaint Altered mental status   HPI Craig Bruce is a 63 y.o. male brought to the ED from work via EMS for altered mentation.  Patient was at work at the [employer] and states he felt overwhelmed with new equipment at work.  Denies active SI/HI/AH/VH.  Coworkers state patient had delayed response to directions.  Patient reports he felt like he could not keep up and was crying "I feel like I cannot go fast enough for them, do this do that, sometimes I just want to walk out".  Denies recent fever, cough, chest pain, shortness of breath, abdominal pain, nausea, vomiting, dizziness.  Denies recent travel, trauma or exposure to persons diagnosed with coronavirus.       Past Medical History:  Diagnosis Date  . Annual physical exam 03/24/2015  . Arthritis    BOTH HANDS  . Chronic kidney disease    H/O KIDNEY STONES  . Diabetes mellitus type 2, controlled, without complications ([place]) 0/17/5102  . DVT (deep venous thrombosis) ([place]) 1996  . Gastritis   . Immunization due 03/24/2015  . Need for influenza vaccination 03/24/2015  . Obesity (BMI 30.0-34.9) 02/17/2015  . Smoker 02/17/2015    Patient Active Problem List   Diagnosis Date Noted  . Post-traumatic osteoarthritis of left knee 10/21/2017  . Other insomnia 04/22/2017  . Mixed hyperlipidemia 04/11/2017  . Obesity (BMI 30.0-34.9) 02/17/2015  . Smoker 02/17/2015  . Diet-controlled type 2 diabetes mellitus ([place]) 02/17/2015    Past Surgical History:  Procedure Laterality Date  . ORCHIECTOMY Right 06/25/2015   Procedure: ORCHIECTOMY;  Surgeon: Nickie Retort, MD;  Location: [facility] ORS;  Service: Urology;  Laterality: Right;  . WISDOM TOOTH EXTRACTION       Prior to Admission medications   Medication Sig Start Date End Date Taking? Authorizing Provider  atorvastatin (LIPITOR) 40 MG tablet TAKE 1 TABLET (40 MG TOTAL) DAILY BY MOUTH. 06/27/18   Mikey College, NP  cyclobenzaprine (FLEXERIL) 5 MG tablet Take 1-2 tablets (5-10 mg total) by mouth 3 (three) times daily as needed for muscle spasms. Take 1-2 tablets 3 times daily as needed 02/26/18   Nance Pear, MD  ezetimibe (ZETIA) 10 MG tablet Take 1 tablet (10 mg total) by mouth daily. 04/28/18   Karamalegos, Devonne Doughty, DO  lidocaine (LIDODERM) 5 % Place 1 patch onto the skin daily. Remove & Discard patch within 12 hours or as directed by MD 11/23/17   Laban Emperor, PA-C  lisinopril (PRINIVIL,ZESTRIL) 5 MG tablet TAKE 1 TABLET BY MOUTH EVERY DAY 07/25/18   Mikey College, NP  meloxicam (MOBIC) 15 MG tablet Take 1 tablet (15 mg total) by mouth daily as needed for pain. 11/29/18   Olin Hauser, DO    Allergies Other  Family History  Problem Relation Age of Onset  . Prostate cancer Neg Hx   . Kidney cancer Neg Hx   . Kidney failure Neg Hx     Social History Social History   Tobacco Use  . Smoking status: Current Every Day Smoker    Packs/day: 0.25    Years: 20.00    Pack years: 5.00    Types: Cigarettes  . Smokeless tobacco: Never Used  . Tobacco comment: Previously smoked  1ppd.  Cutting back and down from 5 cigs / day to 4 cigs per day in 4 weeks.  Substance Use Topics  . Alcohol use: Yes    Alcohol/week: 2.0 standard drinks    Types: 2 Cans of beer per week    Comment: BEER OCC  . Drug use: No    Review of Systems  Constitutional: No fever/chills Eyes: No visual changes. ENT: No sore throat. Cardiovascular: Denies chest pain. Respiratory: Denies shortness of breath. Gastrointestinal: No abdominal pain.  No nausea, no vomiting.  No diarrhea.  No constipation. Genitourinary: Negative for dysuria. Musculoskeletal: Negative for back pain. Skin:  Negative for rash. Neurological: Negative for headaches, focal weakness or numbness. Psychiatric:  Positive for anxiety and feeling overwhelmed.  ____________________________________________   PHYSICAL EXAM:  VITAL SIGNS: ED Triage Vitals  Enc Vitals Group     BP      Pulse      Resp      Temp      Temp src      SpO2      Weight      Height      Head Circumference      Peak Flow      Pain Score      Pain Loc      Pain Edu?      Excl. in [place]?     Constitutional: Alert and oriented. Well appearing and in no acute distress. Eyes: Conjunctivae are normal. PERRL. EOMI. Head: Atraumatic. Nose: No congestion/rhinnorhea. Mouth/Throat: Mucous membranes are moist.  Oropharynx non-erythematous. Neck: No stridor.  Supple neck without meningismus.  No carotid bruits. Cardiovascular: Normal rate, regular rhythm. Grossly normal heart sounds.  Good peripheral circulation. Respiratory: Normal respiratory effort.  No retractions. Lungs CTAB. Gastrointestinal: Soft and nontender. No distention. No abdominal bruits. No CVA tenderness. Musculoskeletal: No lower extremity tenderness nor edema.  No joint effusions. Neurologic: Alert and oriented x3.  CN II-XII grossly intact.  Normal speech and language. No gross focal neurologic deficits are appreciated. No gait instability. Skin:  Skin is warm, dry and intact. No rash noted. Psychiatric: Mood and affect are stressed and anxious. Speech and behavior are normal.  ____________________________________________   LABS (all labs ordered are listed, but only abnormal results are displayed)  Labs Reviewed  COMPREHENSIVE METABOLIC PANEL - Abnormal; Notable for the following components:      Result Value   Glucose, Bld 120 (*)    Calcium 8.8 (*)    Total Bilirubin 1.3 (*)    All other components within normal limits  ACETAMINOPHEN LEVEL - Abnormal; Notable for the following components:   Acetaminophen (Tylenol), Serum <10 (*)    All other  components within normal limits  URINALYSIS, COMPLETE (UACMP) WITH MICROSCOPIC - Abnormal; Notable for the following components:   Color, Urine YELLOW (*)    APPearance CLEAR (*)    All other components within normal limits  BLOOD GAS, ARTERIAL - Abnormal; Notable for the following components:   pH, Arterial 7.46 (*)    Acid-Base Excess 3.7 (*)    All other components within normal limits  CARBOXYHEMOGLOBIN - COOX - Abnormal; Notable for the following components:   Carboxyhemoglobin 6.0 (*)    All other components within normal limits  SARS CORONAVIRUS 2 (HOSPITAL ORDER, [place] [facility])  CBC WITH DIFFERENTIAL/PLATELET  ETHANOL  SALICYLATE LEVEL  LACTIC ACID, PLASMA  LIPASE, BLOOD  URINE DRUG SCREEN, QUALITATIVE ([facility] ONLY)  LACTIC ACID, PLASMA  TROPONIN  I (HIGH SENSITIVITY)   ____________________________________________  EKG  ED ECG REPORT I, SUNG,JADE J, the attending physician, personally viewed and interpreted this ECG.   Date: 12/09/2018  EKG Time: 0512  Rate: 83  Rhythm: normal EKG, normal sinus rhythm  Axis: Normal  Intervals:none  ST&T Change: Nonspecific  ____________________________________________  RADIOLOGY  ED MD interpretation: No ICH, no acute cardiopulmonary process  Official radiology report(s): Dg Chest 2 View  Result Date: 12/09/2018 CLINICAL DATA:  Initial evaluation for acute altered mental status. EXAM: CHEST - 2 VIEW COMPARISON:  Prior radiograph from 05/19/2013 FINDINGS: Cardiac and mediastinal silhouettes are stable in size and contour, and remain within normal limits. Aortic atherosclerosis. Lungs normally inflated. Mild streaky bibasilar subsegmental atelectasis. No focal infiltrates. No edema or effusion. No pneumothorax. No acute osseous finding. IMPRESSION: 1. Mild streaky bibasilar subsegmental atelectasis. No other active cardiopulmonary disease. 2. Aortic atherosclerosis. Electronically Signed   By: Jeannine Boga M.D.   On: 12/09/2018 05:50   Ct Head Wo Contrast  Result Date: 12/09/2018 CLINICAL DATA:  Initial evaluation for acute altered mental status. EXAM: CT HEAD WITHOUT CONTRAST TECHNIQUE: Contiguous axial images were obtained from the base of the skull through the vertex without intravenous contrast. COMPARISON:  Prior CT from 10/01/2009. FINDINGS: Brain: Cerebral volume within normal limits for age. Mild chronic microvascular ischemic disease. Small remote lacunar infarct noted at the left basal ganglia. Small remote cortical infarct at the right parietal lobe. No acute intracranial hemorrhage. No acute large vessel territory infarct. No mass lesion, midline shift or mass effect. No hydrocephalus. No extra-axial fluid collection. Vascular: No hyperdense vessel. Skull: Scalp soft tissues and calvarium within normal limits. Sinuses/Orbits: Globes and orbital soft tissues normal. Visualized paranasal sinuses and mastoid air cells are clear. Other: None. IMPRESSION: 1. No acute intracranial abnormality. 2. Mild chronic microvascular ischemic disease, with small remote left basal ganglia lacunar infarct, with additional small chronic right parietal cortical infarct. Electronically Signed   By: Jeannine Boga M.D.   On: 12/09/2018 05:48    ____________________________________________   PROCEDURES  Procedure(s) performed (including Critical Care):  Procedures  NIH Stroke Scale  Interval: Baseline Time: 7:02 AM Person Administering Scale: SUNG,JADE J  Administer stroke scale items in the order listed. Record performance in each category after each subscale exam. Do not go back and change scores. Follow directions provided for each exam technique. Scores should reflect what the patient does, not what the clinician thinks the patient can do. The clinician should record answers while administering the exam and work quickly. Except where indicated, the patient should not be coached (i.e.,  repeated requests to patient to make a special effort).   1a  Level of consciousness: 0=alert; keenly responsive  1b. LOC questions:  0=Performs both tasks correctly  1c. LOC commands: 0=Performs both tasks correctly  2.  Best Gaze: 0=normal  3.  Visual: 0=No visual loss  4. Facial Palsy: 0=Normal symmetric movement  5a.  Motor left arm: 0=No drift, limb holds 90 (or 45) degrees for full 10 seconds  5b.  Motor right arm: 0=No drift, limb holds 90 (or 45) degrees for full 10 seconds  6a. motor left leg: 0=No drift, limb holds 90 (or 45) degrees for full 10 seconds  6b  Motor right leg:  0=No drift, limb holds 90 (or 45) degrees for full 10 seconds  7. Limb Ataxia: 0=Absent  8.  Sensory: 0=Normal; no sensory loss  9. Best Language:  0=No aphasia, normal  10. Dysarthria: 0=Normal  11. Extinction and Inattention: 0=No abnormality  12. Distal motor function: 0=Normal   Total:   0   ____________________________________________   INITIAL IMPRESSION / ASSESSMENT AND PLAN / ED COURSE  As part of my medical decision making, I reviewed the following data within the [facility] notes reviewed and incorporated, Labs reviewed, EKG interpreted, Old chart reviewed, Radiograph reviewed and Notes from prior ED visits     GEORDAN XU was evaluated in Emergency Department on 12/09/2018 for the symptoms described in the history of present illness. He was evaluated in the context of the global COVID-19 pandemic, which necessitated consideration that the patient might be at risk for infection with the SARS-CoV-2 virus that causes COVID-19. Institutional protocols and algorithms that pertain to the evaluation of patients at risk for COVID-19 are in a state of rapid change based on information released by regulatory bodies including the CDC and federal and state organizations. These policies and algorithms were followed during the patient's care in the ED.   63 year old male with  diabetes and hyperlipidemia brought to the ED for altered mentation, feeling overwhelmed and crying.  Differential diagnosis includes but is not limited to CVA, TIA, anxiety, infectious, metabolic etiologies, etc.  Will obtain toxicological lab work and urine, EKG, CT head and reassess.  Covid swab and consult psychiatry.   Clinical Course as of Dec 08 700  Sat Dec 09, 2018  0602 Carboxyhemoglobin noted.  Will place patient on nonrebreather oxygen x 45 minutes to 1 hour.  Tells me he is a half a pack a day smoker.   [JS]  7322 GURKY and rest of test results unremarkable including imaging studies.  Awaiting psych consult.  Care will be transferred to the oncoming provider Dr. Cherylann Banas.    [JS]    Clinical Course User Index [JS] Paulette Blanch, MD     ____________________________________________   FINAL CLINICAL IMPRESSION(S) / ED DIAGNOSES  Final diagnoses:  Altered mental status, unspecified altered mental status type  Anxiety  Carboxyhemoglobinemia, accidental or unintentional, initial encounter     ED Discharge Orders    None       Note:  This document was prepared using Dragon voice recognition software and may include unintentional dictation errors.   Paulette Blanch, MD 12/09/18 [phone]

## 2018-12-09 NOTE — ED Provider Notes (Signed)
-----------------------------------------   8:10 AM on 12/09/2018 -----------------------------------------  I took over care on this patient from Dr. Beather Arbour.  The patient presented with an episode of anxiety and possible mild confusion which have resolved.  The plan was psychiatry consult per the patient's request, however the patient does not demonstrate acute danger to self or others and is not under commitment.  As per Dr. Beather Arbour, the patient had been medically cleared.  At this time, the patient states that he does not want to wait for the psychiatrist and would prefer to go home.  He is calm, cooperative, and he is alert and oriented x3.  He has no acute psychiatric symptoms at this time.  Lab work-up is unremarkable except for mild increase in carboxyhemoglobin.  At this time, the patient is stable for discharge home.  I counseled him on the results of the work-up.  I gave him thorough return precautions and he expressed understanding.   Arta Silence, MD 12/09/18 [phone]

## 2018-12-09 NOTE — ED Notes (Signed)
Pt met this EDT at the nurse's station and asked when he ws going to be able to leave. This EDT told the pt the nurse would be notified that he was ready to leave. The pt complained of being cold. This EDT told the pt he could have a blanket, the pt said " I don't need a blanket I need to go home." Message was relayed to RN.

## 2018-12-09 NOTE — ED Notes (Signed)
Patient placed on O2 via non rebreather mask at 12 liters per MD.

## 2018-12-12 ENCOUNTER — Other Ambulatory Visit: Payer: Self-pay

## 2018-12-12 ENCOUNTER — Encounter: Payer: Self-pay | Admitting: Family Medicine

## 2018-12-12 ENCOUNTER — Ambulatory Visit (INDEPENDENT_AMBULATORY_CARE_PROVIDER_SITE_OTHER): Payer: Commercial Managed Care - PPO | Admitting: Family Medicine

## 2018-12-12 VITALS — BP 138/88 | HR 83 | Temp 98.2°F | Resp 16 | Ht 69.0 in | Wt 212.0 lb

## 2018-12-12 DIAGNOSIS — G589 Mononeuropathy, unspecified: Secondary | ICD-10-CM | POA: Diagnosis not present

## 2018-12-12 MED ORDER — GABAPENTIN 100 MG PO CAPS: ORAL_CAPSULE | ORAL | 1 refills | Status: DC

## 2018-12-12 NOTE — Patient Instructions (Addendum)
Thank you for coming to the office today.  Start Gabapentin 100mg  capsules, take at night for 2-3 nights only, and then increase to 2 times a day for a few days, and then may increase to 3 times a day, it may make you drowsy, if helps significantly at night only, then you can increase instead to 3 capsules at night, instead of 3 times a day - In the future if needed, we can significantly increase the dose if tolerated well, some common doses are 300mg  three times a day up to 600mg  three times a day, usually it takes several weeks or months to get to higher doses   Please schedule a Follow-up Appointment to: Return in about 3 months (around 03/14/2019), or if symptoms worsen or fail to improve.  If you have any other questions or concerns, please feel free to call the office or send a message through [place]. You may also schedule an earlier appointment if necessary.  Additionally, you may be receiving a survey about your experience at our office within a few days to 1 week by e-mail or mail. We value your feedback.  Nobie Putnam, DO [place]

## 2018-12-12 NOTE — Progress Notes (Signed)
Subjective:    Patient ID: Craig Bruce, male    DOB: 08/03/1955, 63 y.o.   MRN: [id number]  Craig Bruce is a 63 y.o. male presenting on 12/12/2018 for Hospitalization Follow-up (Altered mental status)  PCP is Cassell Smiles, AGPCNP-BC   HPI   ED FOLLOW-UP VISIT  Hospital/Location: [facility] Date of ED Visit: 12/09/18  Reason for Presenting to ED: Altered mental status vs confusion Primary (+Secondary) Diagnosis: Altered mental status without clear diagnosis, question anxiety  FOLLOW-UP - ED provider note and record have been reviewed - Patient presents today about 3 days after recent ED visit. Brief summary of recent course, patient had symptoms of altered mental status and feeling "overwhelmed", taken from work via EMS, presented to ED on 12/09/18, testing in ED with labs, EKG, CT Head, CXR, UA UDS Tox, NIHSS questions, COVID19 test, ultimately all testing was unremarkable, Psych consult was ordered but ultimately patient seemed to improve and remained calm and comfortable, ultimately he was discharged home with reassurance and never received psych consult, declined this.  - Today reports overall has done well after discharge from ED. Symptoms of feeling overwhelmed at work has remained same, he describes it as his boss or new boss has abnormal expectations of him to keep up with work. He denies any depression or significant anxiety  - New medications on discharge: None - Changes to current meds on discharge: None   Additional problem with Left Leg Numbness  His primary medical concern for our visit today was unrelated to ED Visit. He asks about L sided leg pain and numbness, he has history of prior OA/DJD back pain in past 02/2018 and sciatica, treated in past with flexeril and prednisone and other therapy. He is out of medication for this, asking about how to treat the numbness in his leg. Denies any new injury or fall or swelling or redness, other joint pain.   Depression  screen [facility] 2/9 12/12/2018 10/31/2018 10/20/2017  Decreased Interest 0 0 0  Down, Depressed, Hopeless 0 0 0  PHQ - 2 Score 0 0 0  Altered sleeping - - 0  Tired, decreased energy - - 0  Change in appetite - - 0  Feeling bad or failure about yourself  - - 0  Trouble concentrating - - 0  Moving slowly or fidgety/restless - - 0  Suicidal thoughts - - 0  PHQ-9 Score - - 0   No flowsheet data found.   Social History   Tobacco Use  . Smoking status: Current Every Day Smoker    Packs/day: 0.25    Years: 20.00    Pack years: 5.00    Types: Cigarettes  . Smokeless tobacco: Never Used  . Tobacco comment: Previously smoked 1ppd.  Cutting back and down from 5 cigs / day to 4 cigs per day in 4 weeks.  Substance Use Topics  . Alcohol use: Yes    Alcohol/week: 2.0 standard drinks    Types: 2 Cans of beer per week    Comment: BEER OCC  . Drug use: No    Review of Systems Per HPI unless specifically indicated above     Objective:    BP 138/88 (BP Location: Left Arm, Cuff Size: Normal)   Pulse 83   Temp 98.2 F (36.8 C) (Oral)   Resp 16   Ht 5\' 9"  (1.753 m)   Wt 212 lb (96.2 kg)   BMI 31.31 kg/m   Wt Readings from Last 3 Encounters:  12/12/18  212 lb (96.2 kg)  12/09/18 210 lb (95.3 kg)  04/24/18 205 lb 6.4 oz (93.2 kg)    Physical Exam Vitals signs and nursing note reviewed.  Constitutional:      General: He is not in acute distress.    Appearance: He is well-developed. He is not diaphoretic.     Comments: Well-appearing, comfortable, cooperative  HENT:     Head: Normocephalic and atraumatic.  Eyes:     General:        Right eye: No discharge.        Left eye: No discharge.     Conjunctiva/sclera: Conjunctivae normal.  Cardiovascular:     Rate and Rhythm: Normal rate.  Pulmonary:     Effort: Pulmonary effort is normal.  Musculoskeletal:     Left lower leg: Edema (chronic edema compared to R side) present.  Skin:    General: Skin is warm and dry.     Findings: No  erythema or rash.  Neurological:     Mental Status: He is alert and oriented to person, place, and time.  Psychiatric:        Behavior: Behavior normal.     Comments: Well groomed, good eye contact, normal speech and thoughts      I have personally reviewed the radiology report from 12/09/18 CT Head.  CT Head Wo ContrastPerformed 12/09/2018 Final result  Study Result CLINICAL DATA: Initial evaluation for acute altered mental status.  EXAM: CT HEAD WITHOUT CONTRAST  TECHNIQUE: Contiguous axial images were obtained from the base of the skull through the vertex without intravenous contrast.  COMPARISON: Prior CT from 10/01/2009.  FINDINGS: Brain: Cerebral volume within normal limits for age. Mild chronic microvascular ischemic disease. Small remote lacunar infarct noted at the left basal ganglia. Small remote cortical infarct at the right parietal lobe. No acute intracranial hemorrhage. No acute large vessel territory infarct. No mass lesion, midline shift or mass effect. No hydrocephalus. No extra-axial fluid collection.  Vascular: No hyperdense vessel.  Skull: Scalp soft tissues and calvarium within normal limits.  Sinuses/Orbits: Globes and orbital soft tissues normal. Visualized paranasal sinuses and mastoid air cells are clear.  Other: None.  IMPRESSION: 1. No acute intracranial abnormality. 2. Mild chronic microvascular ischemic disease, with small remote left basal ganglia lacunar infarct, with additional small chronic right parietal cortical infarct.   Electronically Signed By: Jeannine Boga M.D. On: 12/09/2018 05:48  -----------------------------------   DG Chest 2 ViewPerformed 12/09/2018 Final result  Study Result CLINICAL DATA: Initial evaluation for acute altered mental status.  EXAM: CHEST - 2 VIEW  COMPARISON: Prior radiograph from 05/19/2013  FINDINGS: Cardiac and mediastinal silhouettes are stable in size and contour, and remain  within normal limits. Aortic atherosclerosis.  Lungs normally inflated. Mild streaky bibasilar subsegmental atelectasis. No focal infiltrates. No edema or effusion. No pneumothorax.  No acute osseous finding.  IMPRESSION: 1. Mild streaky bibasilar subsegmental atelectasis. No other active cardiopulmonary disease. 2. Aortic atherosclerosis.   Electronically Signed By: Jeannine Boga M.D. On: 12/09/2018 05:50   Results for orders placed or performed during the hospital encounter of 12/09/18  SARS Coronavirus 2 (CEPHEID - Performed in [place] hospital lab), [facility] Order   Specimen: Nasopharyngeal Swab  Result Value Ref Range   SARS Coronavirus 2 NEGATIVE NEGATIVE  CBC with Differential  Result Value Ref Range   WBC 7.6 4.0 - 10.5 K/uL   RBC 5.13 4.22 - 5.81 MIL/uL   Hemoglobin 16.4 13.0 - 17.0 g/dL   HCT 45.5 39.0 -  52.0 %   MCV 88.7 80.0 - 100.0 fL   MCH 32.0 26.0 - 34.0 pg   MCHC 36.0 30.0 - 36.0 g/dL   RDW 12.1 11.5 - 15.5 %   Platelets 210 150 - 400 K/uL   nRBC 0.0 0.0 - 0.2 %   Neutrophils Relative % 60 %   Neutro Abs 4.5 1.7 - 7.7 K/uL   Lymphocytes Relative 29 %   Lymphs Abs 2.2 0.7 - 4.0 K/uL   Monocytes Relative 10 %   Monocytes Absolute 0.8 0.1 - 1.0 K/uL   Eosinophils Relative 1 %   Eosinophils Absolute 0.1 0.0 - 0.5 K/uL   Basophils Relative 0 %   Basophils Absolute 0.0 0.0 - 0.1 K/uL   Immature Granulocytes 0 %   Abs Immature Granulocytes 0.02 0.00 - 0.07 K/uL  Comprehensive metabolic panel  Result Value Ref Range   Sodium 141 135 - 145 mmol/L   Potassium 3.6 3.5 - 5.1 mmol/L   Chloride 108 98 - 111 mmol/L   CO2 24 22 - 32 mmol/L   Glucose, Bld 120 (H) 70 - 99 mg/dL   BUN 11 8 - 23 mg/dL   Creatinine, Ser 0.72 0.61 - 1.24 mg/dL   Calcium 8.8 (L) 8.9 - 10.3 mg/dL   Total Protein 7.1 6.5 - 8.1 g/dL   Albumin 4.3 3.5 - 5.0 g/dL   AST 32 15 - 41 U/L   ALT 34 0 - 44 U/L   Alkaline Phosphatase 78 38 - 126 U/L   Total Bilirubin 1.3 (H) 0.3 -  1.2 mg/dL   GFR calc non Af Amer >60 >60 mL/min   GFR calc Af Amer >60 >60 mL/min   Anion gap 9 5 - 15  Ethanol  Result Value Ref Range   Alcohol, Ethyl (B) <10 <10 mg/dL  Acetaminophen level  Result Value Ref Range   Acetaminophen (Tylenol), Serum <10 (L) 10 - 30 ug/mL  Salicylate level  Result Value Ref Range   Salicylate Lvl <2.9 2.8 - 30.0 mg/dL  Lactic acid, plasma  Result Value Ref Range   Lactic Acid, Venous 1.2 0.5 - 1.9 mmol/L  Lipase, blood  Result Value Ref Range   Lipase 28 11 - 51 U/L  Urinalysis, Complete w Microscopic  Result Value Ref Range   Color, Urine YELLOW (A) YELLOW   APPearance CLEAR (A) CLEAR   Specific Gravity, Urine 1.009 1.005 - 1.030   pH 7.0 5.0 - 8.0   Glucose, UA NEGATIVE NEGATIVE mg/dL   Hgb urine dipstick NEGATIVE NEGATIVE   Bilirubin Urine NEGATIVE NEGATIVE   Ketones, ur NEGATIVE NEGATIVE mg/dL   Protein, ur NEGATIVE NEGATIVE mg/dL   Nitrite NEGATIVE NEGATIVE   Leukocytes,Ua NEGATIVE NEGATIVE   RBC / HPF 0-5 0 - 5 RBC/hpf   WBC, UA 0-5 0 - 5 WBC/hpf   Bacteria, UA NONE SEEN NONE SEEN   Squamous Epithelial / LPF NONE SEEN 0 - 5   Mucus PRESENT   Urine Drug Screen, Qualitative  Result Value Ref Range   Tricyclic, Ur Screen NONE DETECTED NONE DETECTED   Amphetamines, Ur Screen NONE DETECTED NONE DETECTED   MDMA (Ecstasy)Ur Screen NONE DETECTED NONE DETECTED   Cocaine Metabolite,Ur [place] NONE DETECTED NONE DETECTED   Opiate, Ur Screen NONE DETECTED NONE DETECTED   Phencyclidine (PCP) Ur S NONE DETECTED NONE DETECTED   Cannabinoid 50 Ng, Ur [place] NONE DETECTED NONE DETECTED   Barbiturates, Ur Screen NONE DETECTED NONE DETECTED   Benzodiazepine, Ur  Scrn NONE DETECTED NONE DETECTED   Methadone Scn, Ur NONE DETECTED NONE DETECTED  Blood gas, arterial  Result Value Ref Range   FIO2 0.21    pH, Arterial 7.46 (H) 7.350 - 7.450   pCO2 arterial 39 32.0 - 48.0 mmHg   pO2, Arterial 93 83.0 - 108.0 mmHg   Bicarbonate 27.7 20.0 - 28.0 mmol/L    Acid-Base Excess 3.7 (H) 0.0 - 2.0 mmol/L   O2 Saturation 97.6 %   Patient temperature 37.0    Collection site RIGHT RADIAL    Sample type ARTERIAL DRAW    Allens test (pass/fail) PASS PASS  Carboxyhemoglobin (single result)  Result Value Ref Range   Carboxyhemoglobin 6.0 (HH) 0.5 - 1.5 %  Troponin I (High Sensitivity)  Result Value Ref Range   Troponin I (High Sensitivity) 6 <18 ng/L      Assessment & Plan:   Problem List Items Addressed This Visit    None    Visit Diagnoses    Mononeuropathy    -  Primary   Relevant Medications   gabapentin (NEURONTIN) 100 MG capsule    Clinically consistent with subacute on chronic Left lower extremity numbness or neuropathy in left lower extremity with known OA/DJD sciatica in past, uncertain exact trigger but known chronic issues  Chronic LLE edema compared to right, for >20 years by his report. No evidence to support acute DVT, less likely at this time, Well's score 1 low risk  - Has improved in past on short term treatment prednisone/muscle relaxant for flare - Trial on Gabapentin now, caution with use during day, risk of sedation, prefer night - time dosing only 100 up to 300mg  QHS - Follow-up in future May warrant refer Neuro vs Vascular based on his lower extremity concerns.    #Altered mental status - RESOLVED Reviewed ED chart from 12/09/18, extensive testing, overall mostly seems unremarkable without new acute change, based on his history it seems to be more functional symptoms with a frustration with changes at work, does not seem to be consistent with new mental health diagnosis at this time. - reassurance given  Meds ordered this encounter  Medications  . gabapentin (NEURONTIN) 100 MG capsule    Sig: Start 1 capsule daily, increase by 1 cap every 2-3 days as tolerated up to 3 times a day, or may take 3 at once in evening.    Dispense:  90 capsule    Refill:  1     Follow up plan: Return in about 3 months (around  03/14/2019), or if symptoms worsen or fail to improve.  Nobie Putnam, DO [facility] 12/12/2018, 2:05 PM

## 2019-01-19 ENCOUNTER — Other Ambulatory Visit: Payer: Self-pay | Admitting: Nurse Practitioner

## 2019-01-19 DIAGNOSIS — E119 Type 2 diabetes mellitus without complications: Secondary | ICD-10-CM

## 2019-03-03 ENCOUNTER — Other Ambulatory Visit: Payer: Self-pay | Admitting: Family Medicine

## 2019-03-03 DIAGNOSIS — M1732 Unilateral post-traumatic osteoarthritis, left knee: Secondary | ICD-10-CM

## 2019-03-13 ENCOUNTER — Other Ambulatory Visit: Payer: Self-pay

## 2019-03-13 ENCOUNTER — Ambulatory Visit (INDEPENDENT_AMBULATORY_CARE_PROVIDER_SITE_OTHER): Payer: Commercial Managed Care - PPO | Admitting: Nurse Practitioner

## 2019-03-13 ENCOUNTER — Encounter: Payer: Self-pay | Admitting: Nurse Practitioner

## 2019-03-13 DIAGNOSIS — J019 Acute sinusitis, unspecified: Secondary | ICD-10-CM

## 2019-03-13 MED ORDER — IPRATROPIUM BROMIDE 0.06 % NA SOLN: 2.0000 | Freq: Four times a day (QID) | NASAL | 12 refills | Status: DC

## 2019-03-13 NOTE — Progress Notes (Signed)
Telemedicine Encounter: Disclosed to patient at start of encounter that we will provide appropriate telemedicine services.  Patient consents to be treated via phone prior to discussion. - Patient is at his home and is accessed via telephone. - Services are provided by Cassell Smiles from [facility].  Subjective:    Patient ID: Craig Bruce, male    DOB: [birth year], 63 y.o.   MRN: [id number]  CARDARIUS VIZCAYA is a 63 y.o. male presenting on 03/13/2019 for Cough (stuffy nose, cramping up around ribcage x 5days ago. Pt was tested for COVID 19 and the test was negative. He still got some stiffiness, mild productive cough)  HPI Cough Patient has had resolved cramping around rib cage.  Still has stuffy nose, cough.  Cough is keeping him from sleeping. - Patient has not been taking anything for symptoms to date. - no fever at all - no nausea, vomiting, or diarrhea  Patient needs letter to okay return to work.  Social History   Tobacco Use  . Smoking status: Current Every Day Smoker    Packs/day: 0.25    Years: 20.00    Pack years: 5.00    Types: Cigarettes  . Smokeless tobacco: Never Used  . Tobacco comment: Previously smoked 1ppd.  Cutting back and down from 5 cigs / day to 4 cigs per day in 4 weeks.  Substance Use Topics  . Alcohol use: Yes    Alcohol/week: 2.0 standard drinks    Types: 2 Cans of beer per week    Comment: BEER OCC  . Drug use: No    Review of Systems Per HPI unless specifically indicated above     Objective:    There were no vitals taken for this visit.  Wt Readings from Last 3 Encounters:  12/12/18 212 lb (96.2 kg)  12/09/18 210 lb (95.3 kg)  04/24/18 205 lb 6.4 oz (93.2 kg)    Physical Exam Patient remotely monitored.  Verbal communication appropriate.  Cognition normal.   Results for orders placed or performed during the hospital encounter of 12/09/18  SARS Coronavirus 2 (CEPHEID - Performed in [place] hospital lab), [facility]  Order   Specimen: Nasopharyngeal Swab  Result Value Ref Range   SARS Coronavirus 2 NEGATIVE NEGATIVE  CBC with Differential  Result Value Ref Range   WBC 7.6 4.0 - 10.5 K/uL   RBC 5.13 4.22 - 5.81 MIL/uL   Hemoglobin 16.4 13.0 - 17.0 g/dL   HCT 45.5 39.0 - 52.0 %   MCV 88.7 80.0 - 100.0 fL   MCH 32.0 26.0 - 34.0 pg   MCHC 36.0 30.0 - 36.0 g/dL   RDW 12.1 11.5 - 15.5 %   Platelets 210 150 - 400 K/uL   nRBC 0.0 0.0 - 0.2 %   Neutrophils Relative % 60 %   Neutro Abs 4.5 1.7 - 7.7 K/uL   Lymphocytes Relative 29 %   Lymphs Abs 2.2 0.7 - 4.0 K/uL   Monocytes Relative 10 %   Monocytes Absolute 0.8 0.1 - 1.0 K/uL   Eosinophils Relative 1 %   Eosinophils Absolute 0.1 0.0 - 0.5 K/uL   Basophils Relative 0 %   Basophils Absolute 0.0 0.0 - 0.1 K/uL   Immature Granulocytes 0 %   Abs Immature Granulocytes 0.02 0.00 - 0.07 K/uL  Comprehensive metabolic panel  Result Value Ref Range   Sodium 141 135 - 145 mmol/L   Potassium 3.6 3.5 - 5.1 mmol/L   Chloride 108  98 - 111 mmol/L   CO2 24 22 - 32 mmol/L   Glucose, Bld 120 (H) 70 - 99 mg/dL   BUN 11 8 - 23 mg/dL   Creatinine, Ser 0.72 0.61 - 1.24 mg/dL   Calcium 8.8 (L) 8.9 - 10.3 mg/dL   Total Protein 7.1 6.5 - 8.1 g/dL   Albumin 4.3 3.5 - 5.0 g/dL   AST 32 15 - 41 U/L   ALT 34 0 - 44 U/L   Alkaline Phosphatase 78 38 - 126 U/L   Total Bilirubin 1.3 (H) 0.3 - 1.2 mg/dL   GFR calc non Af Amer >60 >60 mL/min   GFR calc Af Amer >60 >60 mL/min   Anion gap 9 5 - 15  Ethanol  Result Value Ref Range   Alcohol, Ethyl (B) <10 <10 mg/dL  Acetaminophen level  Result Value Ref Range   Acetaminophen (Tylenol), Serum <10 (L) 10 - 30 ug/mL  Salicylate level  Result Value Ref Range   Salicylate Lvl [id number] 2.8 - 30.0 mg/dL  Lactic acid, plasma  Result Value Ref Range   Lactic Acid, Venous 1.2 0.5 - 1.9 mmol/L  Lipase, blood  Result Value Ref Range   Lipase 28 11 - 51 U/L  Urinalysis, Complete w Microscopic  Result Value Ref Range   Color,  Urine YELLOW (A) YELLOW   APPearance CLEAR (A) CLEAR   Specific Gravity, Urine 1.009 1.005 - 1.030   pH 7.0 5.0 - 8.0   Glucose, UA NEGATIVE NEGATIVE mg/dL   Hgb urine dipstick NEGATIVE NEGATIVE   Bilirubin Urine NEGATIVE NEGATIVE   Ketones, ur NEGATIVE NEGATIVE mg/dL   Protein, ur NEGATIVE NEGATIVE mg/dL   Nitrite NEGATIVE NEGATIVE   Leukocytes,Ua NEGATIVE NEGATIVE   RBC / HPF 0-5 0 - 5 RBC/hpf   WBC, UA 0-5 0 - 5 WBC/hpf   Bacteria, UA NONE SEEN NONE SEEN   Squamous Epithelial / LPF NONE SEEN 0 - 5   Mucus PRESENT   Urine Drug Screen, Qualitative  Result Value Ref Range   Tricyclic, Ur Screen NONE DETECTED NONE DETECTED   Amphetamines, Ur Screen NONE DETECTED NONE DETECTED   MDMA (Ecstasy)Ur Screen NONE DETECTED NONE DETECTED   Cocaine Metabolite,Ur [place] NONE DETECTED NONE DETECTED   Opiate, Ur Screen NONE DETECTED NONE DETECTED   Phencyclidine (PCP) Ur S NONE DETECTED NONE DETECTED   Cannabinoid 50 Ng, Ur [place] NONE DETECTED NONE DETECTED   Barbiturates, Ur Screen NONE DETECTED NONE DETECTED   Benzodiazepine, Ur Scrn NONE DETECTED NONE DETECTED   Methadone Scn, Ur NONE DETECTED NONE DETECTED  Blood gas, arterial  Result Value Ref Range   FIO2 0.21    pH, Arterial 7.46 (H) 7.350 - 7.450   pCO2 arterial 39 32.0 - 48.0 mmHg   pO2, Arterial 93 83.0 - 108.0 mmHg   Bicarbonate 27.7 20.0 - 28.0 mmol/L   Acid-Base Excess 3.7 (H) 0.0 - 2.0 mmol/L   O2 Saturation 97.6 %   Patient temperature 37.0    Collection site RIGHT RADIAL    Sample type ARTERIAL DRAW    Allens test (pass/fail) PASS PASS  Carboxyhemoglobin (single result)  Result Value Ref Range   Carboxyhemoglobin 6.0 (HH) 0.5 - 1.5 %  Troponin I (High Sensitivity)  Result Value Ref Range   Troponin I (High Sensitivity) 6 <18 ng/L      Assessment & Plan:   Problem List Items Addressed This Visit    None    Visit Diagnoses  Acute rhinosinusitis    -  Primary   Relevant Medications   ipratropium (ATROVENT) 0.06 %  nasal spray    Acute illness. Fever responsive to NSAIDs and tylenol.  Symptoms not worsening. Consistent with viral illness x 5 days with no known sick contacts and no identifiable focal infections of ears, nose, throat.  Plan: 1. Reassurance, likely self-limited with cough lasting up to few weeks - Start Atrovent nasal spray decongestant 2 sprays each nostril up to 4 times daily for 5-7 days - Can use Flonase 2 sprays each nostril daily for up to 4-6 weeks - Start Mucinex-DM OTC up to 7-10 days then stop - May take benadryl x 1 tab (25 mg) prn sleep. 2. Supportive care with nasal saline, warm herbal tea with honey, 3. Improve hydration 4. Tylenol / Motrin PRN fevers 5. Return criteria given    Meds ordered this encounter  Medications  . ipratropium (ATROVENT) 0.06 % nasal spray    Sig: Place 2 sprays into both nostrils 4 (four) times daily.    Dispense:  15 mL    Refill:  12    Order Specific Question:   Supervising Provider    Answer:   Olin Hauser [[id number]]    - Time spent in direct consultation with patient via telemedicine about above concerns: 6 minutes  Follow up plan: Follow-up 5-7 days  Cassell Smiles, DNP, AGPCNP-BC Adult Gerontology Primary Care Nurse Practitioner [facility] 03/13/2019, 10:17 AM

## 2019-05-03 ENCOUNTER — Encounter: Payer: Self-pay | Admitting: Nurse Practitioner

## 2019-06-20 ENCOUNTER — Other Ambulatory Visit: Payer: Self-pay | Admitting: Family Medicine

## 2019-06-20 DIAGNOSIS — E782 Mixed hyperlipidemia: Secondary | ICD-10-CM

## 2019-06-29 ENCOUNTER — Other Ambulatory Visit: Payer: Self-pay

## 2019-06-29 ENCOUNTER — Emergency Department
Admission: EM | Admit: 2019-06-29 | Discharge: 2019-06-29 | Disposition: A | Discharge status: Home / Self Care | Payer: No Typology Code available for payment source | Attending: Emergency Medicine | Admitting: Emergency Medicine

## 2019-06-29 DIAGNOSIS — S3992XA Unspecified injury of lower back, initial encounter: Secondary | ICD-10-CM | POA: Diagnosis present

## 2019-06-29 DIAGNOSIS — F17210 Nicotine dependence, cigarettes, uncomplicated: Secondary | ICD-10-CM | POA: Diagnosis not present

## 2019-06-29 DIAGNOSIS — X500XXA Overexertion from strenuous movement or load, initial encounter: Secondary | ICD-10-CM | POA: Insufficient documentation

## 2019-06-29 DIAGNOSIS — Y990 Civilian activity done for income or pay: Secondary | ICD-10-CM | POA: Insufficient documentation

## 2019-06-29 DIAGNOSIS — S39012A Strain of muscle, fascia and tendon of lower back, initial encounter: Secondary | ICD-10-CM | POA: Diagnosis not present

## 2019-06-29 DIAGNOSIS — Z79899 Other long term (current) drug therapy: Secondary | ICD-10-CM | POA: Insufficient documentation

## 2019-06-29 DIAGNOSIS — Y9389 Activity, other specified: Secondary | ICD-10-CM | POA: Diagnosis not present

## 2019-06-29 DIAGNOSIS — Y9259 Other trade areas as the place of occurrence of the external cause: Secondary | ICD-10-CM | POA: Diagnosis not present

## 2019-06-29 MED ORDER — CYCLOBENZAPRINE HCL 10 MG PO TABS: 10.0000 mg | ORAL_TABLET | Freq: Three times a day (TID) | ORAL | 0 refills | Status: DC | PRN

## 2019-06-29 MED ORDER — TRAMADOL HCL 50 MG PO TABS: 50.0000 mg | ORAL_TABLET | Freq: Four times a day (QID) | ORAL | 0 refills | Status: DC | PRN

## 2019-06-29 MED ORDER — LIDOCAINE 5 % EX PTCH
1.0000 | MEDICATED_PATCH | CUTANEOUS | Status: DC
  Administered 2019-06-29: 1 via TRANSDERMAL
  Filled 2019-06-29: qty 1

## 2019-06-29 NOTE — Discharge Instructions (Signed)
Follow discharge care instruction.  Use Lidoderm patches as instructed.

## 2019-06-29 NOTE — ED Notes (Signed)
See triage note  Presents with lower back pain   States he developed pain after lifting something heavy at work  Ambulates well to treatment room

## 2019-06-29 NOTE — ED Triage Notes (Signed)
Patient c/o lower right back pain after lifting heavy boxes at work today. Patient denies fall.

## 2019-06-29 NOTE — ED Notes (Addendum)
Pt employer requests a UDS for WC.  Consent signed and specimen received. (ID no [id number])  Specimen released to lab via chain of custody protocol.

## 2019-06-29 NOTE — ED Provider Notes (Signed)
[facility] Emergency Department Provider Note   ____________________________________________   First MD Initiated Contact with Patient 06/29/19 [phone]     (approximate)  I have reviewed the triage vital signs and the nursing notes.   HISTORY  Chief Complaint Back Pain    HPI Craig Bruce is a 64 y.o. male patient complain of right lateral back pain secondary to lifting incident which occurred at work last night.  Patient state he was lifting heavy boxes of equipment when he felt a "catch" in his back.  Patient denies radicular component to this back pain.  Patient denies bladder bowel dysfunction.  Patient has a history of degenerative disc disease.  Patient rates pain as a 10/10.  Patient described the pain as "achy".  No palliative measures for complaint.         Past Medical History:  Diagnosis Date  . Annual physical exam 03/24/2015  . Arthritis    BOTH HANDS  . Chronic kidney disease    H/O KIDNEY STONES  . Diabetes mellitus type 2, controlled, without complications ([place]) [id number]  . DVT (deep venous thrombosis) ([place]) 1996  . Gastritis   . Immunization due 03/24/2015  . Need for influenza vaccination 03/24/2015  . Obesity (BMI 30.0-34.9) 02/17/2015  . Smoker 02/17/2015    Patient Active Problem List   Diagnosis Date Noted  . Post-traumatic osteoarthritis of left knee 10/21/2017  . Other insomnia 04/22/2017  . Mixed hyperlipidemia 04/11/2017  . Obesity (BMI 30.0-34.9) 02/17/2015  . Smoker 02/17/2015  . Diet-controlled type 2 diabetes mellitus ([place]) 02/17/2015    Past Surgical History:  Procedure Laterality Date  . ORCHIECTOMY Right 06/25/2015   Procedure: ORCHIECTOMY;  Surgeon: Nickie Retort, MD;  Location: [facility] ORS;  Service: Urology;  Laterality: Right;  . WISDOM TOOTH EXTRACTION      Prior to Admission medications   Medication Sig Start Date End Date Taking? Authorizing Provider  atorvastatin (LIPITOR) 40 MG tablet TAKE  1 TABLET (40 MG TOTAL) DAILY BY MOUTH. 06/27/18   Mikey College, NP  cyclobenzaprine (FLEXERIL) 10 MG tablet Take 1 tablet (10 mg total) by mouth 3 (three) times daily as needed. 06/29/19   Sable Feil, PA-C  cyclobenzaprine (FLEXERIL) 5 MG tablet Take 1-2 tablets (5-10 mg total) by mouth 3 (three) times daily as needed for muscle spasms. Take 1-2 tablets 3 times daily as needed Patient taking differently: Take 5-10 mg by mouth once. Take 1-2 tablets 3 times daily as needed 02/26/18   Nance Pear, MD  ezetimibe (ZETIA) 10 MG tablet TAKE 1 TABLET BY MOUTH EVERY DAY 06/20/19   Parks Ranger, Devonne Doughty, DO  gabapentin (NEURONTIN) 100 MG capsule Start 1 capsule daily, increase by 1 cap every 2-3 days as tolerated up to 3 times a day, or may take 3 at once in evening. 12/12/18   Parks Ranger, Devonne Doughty, DO  ipratropium (ATROVENT) 0.06 % nasal spray Place 2 sprays into both nostrils 4 (four) times daily. 03/13/19   Mikey College, NP  lisinopril (ZESTRIL) 5 MG tablet TAKE 1 TABLET BY MOUTH EVERY DAY 01/22/19   Mikey College, NP  meloxicam (MOBIC) 15 MG tablet TAKE 1 TABLET BY MOUTH EVERY DAY AS NEEDED FOR PAIN 03/05/19   Mikey College, NP  traMADol (ULTRAM) 50 MG tablet Take 1 tablet (50 mg total) by mouth every 6 (six) hours as needed. 06/29/19 06/28/20  Sable Feil, PA-C  lisinopril (PRINIVIL,ZESTRIL) 5 MG tablet TAKE 1 TABLET BY  MOUTH EVERY DAY 07/25/18   Mikey College, NP    Allergies Other  Family History  Problem Relation Age of Onset  . Prostate cancer Neg Hx   . Kidney cancer Neg Hx   . Kidney failure Neg Hx     Social History Social History   Tobacco Use  . Smoking status: Current Every Day Smoker    Packs/day: 0.25    Years: 20.00    Pack years: 5.00    Types: Cigarettes  . Smokeless tobacco: Never Used  . Tobacco comment: Previously smoked 1ppd.  Cutting back and down from 5 cigs / day to 4 cigs per day in 4 weeks.  Substance Use Topics    . Alcohol use: Yes    Alcohol/week: 2.0 standard drinks    Types: 2 Cans of beer per week    Comment: BEER OCC  . Drug use: No    Review of Systems Constitutional: No fever/chills Eyes: No visual changes. ENT: No sore throat. Cardiovascular: Denies chest pain. Respiratory: Denies shortness of breath. Gastrointestinal: No abdominal pain.  No nausea, no vomiting.  No diarrhea.  No constipation. Genitourinary: Negative for dysuria. Musculoskeletal: Positive for back pain. Skin: Negative for rash. Neurological: Negative for headaches, focal weakness or numbness. Endocrine:  Diabetes and hypertension.   ____________________________________________   PHYSICAL EXAM:  VITAL SIGNS: ED Triage Vitals  Enc Vitals Group     BP 06/29/19 0142 (!) 154/83     Pulse Rate 06/29/19 0142 72     Resp 06/29/19 0142 18     Temp 06/29/19 0142 97.6 F (36.4 C)     Temp Source 06/29/19 0530 Oral     SpO2 06/29/19 0142 98 %     Weight 06/29/19 0143 210 lb (95.3 kg)     Height 06/29/19 0143 5\' 7"  (1.702 m)     Head Circumference --      Peak Flow --      Pain Score 06/29/19 0142 10     Pain Loc --      Pain Edu? --      Excl. in [place]? --    Constitutional: Alert and oriented. Well appearing and in no acute distress. Neck:No cervical spine tenderness to palpation. Hematological/Lymphatic/Immunilogical: No cervical lymphadenopathy. Cardiovascular: Normal rate, regular rhythm. Grossly normal heart sounds.  Good peripheral circulation. Respiratory: Normal respiratory effort.  No retractions. Lungs CTAB. Gastrointestinal: Soft and nontender. No distention. No abdominal bruits. No CVA tenderness. Genitourinary: Deferred Musculoskeletal: No obvious spinal deformity.  No guarding palpation spinal processes.  Patient has moderate guarding palpation of the right paraspinal muscle area.  Patient has muscle spasms to the right paraspinal muscle area with left lateral movements.  No lower extremity  tenderness nor edema.  No joint effusions. Neurologic:  Normal speech and language. No gross focal neurologic deficits are appreciated. No gait instability. Skin:  Skin is warm, dry and intact. No rash noted. Psychiatric: Mood and affect are normal. Speech and behavior are normal.  ____________________________________________   LABS (all labs ordered are listed, but only abnormal results are displayed)  Labs Reviewed - No data to display ____________________________________________  EKG   ____________________________________________  RADIOLOGY  ED MD interpretation:    Official radiology report(s): No results found.  ____________________________________________   PROCEDURES  Procedure(s) performed (including Critical Care):  Procedures   ____________________________________________   INITIAL IMPRESSION / ASSESSMENT AND PLAN / ED COURSE  As part of my medical decision making, I reviewed the following data within the  electronic MEDICAL RECORD NUMBER     Patient presents with low back pain secondary to lifting incident work.  Physical exam is consistent with muscle strain.  Patient given discharge care instruction advised take medication as directed.  Patient advised follow-up PCP if no improvement in 3 to 5 days.    Craig Bruce was evaluated in Emergency Department on 06/29/2019 for the symptoms described in the history of present illness. He was evaluated in the context of the global COVID-19 pandemic, which necessitated consideration that the patient might be at risk for infection with the SARS-CoV-2 virus that causes COVID-19. Institutional protocols and algorithms that pertain to the evaluation of patients at risk for COVID-19 are in a state of rapid change based on information released by regulatory bodies including the CDC and federal and state organizations. These policies and algorithms were followed during the patient's care in the ED.        ____________________________________________   FINAL CLINICAL IMPRESSION(S) / ED DIAGNOSES  Final diagnoses:  Strain of lumbar region, initial encounter     ED Discharge Orders         Ordered    traMADol (ULTRAM) 50 MG tablet  Every 6 hours PRN     06/29/19 0714    cyclobenzaprine (FLEXERIL) 10 MG tablet  3 times daily PRN     06/29/19 [id number]           Note:  This document was prepared using Dragon voice recognition software and may include unintentional dictation errors.    Sable Feil, PA-C 06/29/19 [id number]    Arta Silence, MD 06/29/19 [phone]

## 2019-07-02 ENCOUNTER — Encounter: Payer: Self-pay | Admitting: Family Medicine

## 2019-07-02 ENCOUNTER — Ambulatory Visit (INDEPENDENT_AMBULATORY_CARE_PROVIDER_SITE_OTHER): Payer: Commercial Managed Care - PPO | Admitting: Family Medicine

## 2019-07-02 ENCOUNTER — Other Ambulatory Visit: Payer: Self-pay

## 2019-07-02 VITALS — BP 142/84 | HR 88 | Resp 16 | Ht 69.0 in | Wt 217.0 lb

## 2019-07-02 DIAGNOSIS — M545 Low back pain, unspecified: Secondary | ICD-10-CM

## 2019-07-02 DIAGNOSIS — M8949 Other hypertrophic osteoarthropathy, multiple sites: Secondary | ICD-10-CM

## 2019-07-02 DIAGNOSIS — M170 Bilateral primary osteoarthritis of knee: Secondary | ICD-10-CM

## 2019-07-02 DIAGNOSIS — M159 Polyosteoarthritis, unspecified: Secondary | ICD-10-CM

## 2019-07-02 MED ORDER — NAPROXEN 500 MG PO TABS: 500.0000 mg | ORAL_TABLET | Freq: Two times a day (BID) | ORAL | 0 refills | Status: DC

## 2019-07-02 NOTE — Progress Notes (Signed)
Subjective:    Patient ID: Craig Bruce, male    DOB: 01-22-1956, 64 y.o.   MRN: [id number]  Craig Bruce is a 63 y.o. male presenting on 07/02/2019 for Back Pain (went to ED 02/09)   HPI   ED FOLLOW-UP VISIT  Hospital/Location: [place] Date of ED Visit: 06/29/19  Reason for Presenting to ED: Back Strain Primary (+Secondary) Diagnosis: Lumbar Back Strain  FOLLOW-UP  - ED provider note and record have been reviewed - Patient presents today about 3 days after recent ED visit. Brief summary of recent course, patient had symptoms of Right lower back pain following heavy lifting injury, presented to ED, did not have any imaging but diagnosed with localized R lower lumbar strain, treated with lidocaine patch, rx Tramadol and Flexeril PRN. - Today reports overall has done well after discharge from ED. Symptoms of R low back pain has improved on flexeril and Tramadol PRN, these make him a little sleepy so he has scaled back and overall still improved. - Not on NSAID, Tylenol PRN - Denies any fevers/chills, numbness, tingling, weakness, loss of control bladder/bowel incontinence or retention, unintentional wt loss, night sweats   I have reviewed the discharge medication list, and have reconciled the current and discharge medications today.   Depression screen [facility] 2/9 12/12/2018 10/31/2018 10/20/2017  Decreased Interest 0 0 0  Down, Depressed, Hopeless 0 0 0  PHQ - 2 Score 0 0 0  Altered sleeping - - 0  Tired, decreased energy - - 0  Change in appetite - - 0  Feeling bad or failure about yourself  - - 0  Trouble concentrating - - 0  Moving slowly or fidgety/restless - - 0  Suicidal thoughts - - 0  PHQ-9 Score - - 0    Social History   Tobacco Use  . Smoking status: Current Every Day Smoker    Packs/day: 0.25    Years: 20.00    Pack years: 5.00    Types: Cigarettes  . Smokeless tobacco: Never Used  . Tobacco comment: Previously smoked 1ppd.  Cutting back and down from 5 cigs /  day to 4 cigs per day in 4 weeks.  Substance Use Topics  . Alcohol use: Yes    Alcohol/week: 2.0 standard drinks    Types: 2 Cans of beer per week    Comment: BEER OCC  . Drug use: No    Review of Systems Per HPI unless specifically indicated above     Objective:    BP (!) 142/84   Pulse 88   Resp 16   Ht 5\' 9"  (1.753 m)   Wt 217 lb (98.4 kg)   BMI 32.05 kg/m   Wt Readings from Last 3 Encounters:  07/02/19 217 lb (98.4 kg)  06/29/19 210 lb (95.3 kg)  12/12/18 212 lb (96.2 kg)    Physical Exam Vitals and nursing note reviewed.  Constitutional:      General: He is not in acute distress.    Appearance: He is well-developed. He is not diaphoretic.     Comments: Well-appearing, comfortable, cooperative  HENT:     Head: Normocephalic and atraumatic.  Eyes:     General:        Right eye: No discharge.        Left eye: No discharge.     Conjunctiva/sclera: Conjunctivae normal.  Cardiovascular:     Rate and Rhythm: Normal rate.  Pulmonary:     Effort: Pulmonary effort is normal.  Musculoskeletal:     Comments: Low Back Inspection: Normal appearance, no spinal deformity, symmetrical. Palpation: No tenderness over spinous processes. R lower back paraspinal lumbar spine with some hypertonicity and tender ROM: Full active ROM forward flex / back extension, rotation L/R without discomfort Special Testing: Seated SLR negative for radicular pain bilaterally  Strength: Bilateral hip flex/ext 5/5, knee flex/ext 5/5, ankle dorsiflex/plantarflex 5/5 Neurovascular: intact distal sensation to light touch  Knees, crepitus bilateral  Skin:    General: Skin is warm and dry.     Findings: No erythema or rash.  Neurological:     Mental Status: He is alert and oriented to person, place, and time.  Psychiatric:        Behavior: Behavior normal.     Comments: Well groomed, good eye contact, normal speech and thoughts       Results for orders placed or performed during the hospital  encounter of 12/09/18  SARS Coronavirus 2 (CEPHEID - Performed in [place] hospital lab), [facility] Order   Specimen: Nasopharyngeal Swab  Result Value Ref Range   SARS Coronavirus 2 NEGATIVE NEGATIVE  CBC with Differential  Result Value Ref Range   WBC 7.6 4.0 - 10.5 K/uL   RBC 5.13 4.22 - 5.81 MIL/uL   Hemoglobin 16.4 13.0 - 17.0 g/dL   HCT 45.5 39.0 - 52.0 %   MCV 88.7 80.0 - 100.0 fL   MCH 32.0 26.0 - 34.0 pg   MCHC 36.0 30.0 - 36.0 g/dL   RDW 12.1 11.5 - 15.5 %   Platelets 210 150 - 400 K/uL   nRBC 0.0 0.0 - 0.2 %   Neutrophils Relative % 60 %   Neutro Abs 4.5 1.7 - 7.7 K/uL   Lymphocytes Relative 29 %   Lymphs Abs 2.2 0.7 - 4.0 K/uL   Monocytes Relative 10 %   Monocytes Absolute 0.8 0.1 - 1.0 K/uL   Eosinophils Relative 1 %   Eosinophils Absolute 0.1 0.0 - 0.5 K/uL   Basophils Relative 0 %   Basophils Absolute 0.0 0.0 - 0.1 K/uL   Immature Granulocytes 0 %   Abs Immature Granulocytes 0.02 0.00 - 0.07 K/uL  Comprehensive metabolic panel  Result Value Ref Range   Sodium 141 135 - 145 mmol/L   Potassium 3.6 3.5 - 5.1 mmol/L   Chloride 108 98 - 111 mmol/L   CO2 24 22 - 32 mmol/L   Glucose, Bld 120 (H) 70 - 99 mg/dL   BUN 11 8 - 23 mg/dL   Creatinine, Ser 0.72 0.61 - 1.24 mg/dL   Calcium 8.8 (L) 8.9 - 10.3 mg/dL   Total Protein 7.1 6.5 - 8.1 g/dL   Albumin 4.3 3.5 - 5.0 g/dL   AST 32 15 - 41 U/L   ALT 34 0 - 44 U/L   Alkaline Phosphatase 78 38 - 126 U/L   Total Bilirubin 1.3 (H) 0.3 - 1.2 mg/dL   GFR calc non Af Amer >60 >60 mL/min   GFR calc Af Amer >60 >60 mL/min   Anion gap 9 5 - 15  Ethanol  Result Value Ref Range   Alcohol, Ethyl (B) <10 <10 mg/dL  Acetaminophen level  Result Value Ref Range   Acetaminophen (Tylenol), Serum <10 (L) 10 - 30 ug/mL  Salicylate level  Result Value Ref Range   Salicylate Lvl [id number] 2.8 - 30.0 mg/dL  Lactic acid, plasma  Result Value Ref Range   Lactic Acid, Venous 1.2 0.5 - 1.9 mmol/L  Lipase,  blood  Result Value Ref Range    Lipase 28 11 - 51 U/L  Urinalysis, Complete w Microscopic  Result Value Ref Range   Color, Urine YELLOW (A) YELLOW   APPearance CLEAR (A) CLEAR   Specific Gravity, Urine 1.009 1.005 - 1.030   pH 7.0 5.0 - 8.0   Glucose, UA NEGATIVE NEGATIVE mg/dL   Hgb urine dipstick NEGATIVE NEGATIVE   Bilirubin Urine NEGATIVE NEGATIVE   Ketones, ur NEGATIVE NEGATIVE mg/dL   Protein, ur NEGATIVE NEGATIVE mg/dL   Nitrite NEGATIVE NEGATIVE   Leukocytes,Ua NEGATIVE NEGATIVE   RBC / HPF 0-5 0 - 5 RBC/hpf   WBC, UA 0-5 0 - 5 WBC/hpf   Bacteria, UA NONE SEEN NONE SEEN   Squamous Epithelial / LPF NONE SEEN 0 - 5   Mucus PRESENT   Urine Drug Screen, Qualitative  Result Value Ref Range   Tricyclic, Ur Screen NONE DETECTED NONE DETECTED   Amphetamines, Ur Screen NONE DETECTED NONE DETECTED   MDMA (Ecstasy)Ur Screen NONE DETECTED NONE DETECTED   Cocaine Metabolite,Ur [place] NONE DETECTED NONE DETECTED   Opiate, Ur Screen NONE DETECTED NONE DETECTED   Phencyclidine (PCP) Ur S NONE DETECTED NONE DETECTED   Cannabinoid 50 Ng, Ur [place] NONE DETECTED NONE DETECTED   Barbiturates, Ur Screen NONE DETECTED NONE DETECTED   Benzodiazepine, Ur Scrn NONE DETECTED NONE DETECTED   Methadone Scn, Ur NONE DETECTED NONE DETECTED  Blood gas, arterial  Result Value Ref Range   FIO2 0.21    pH, Arterial 7.46 (H) 7.350 - 7.450   pCO2 arterial 39 32.0 - 48.0 mmHg   pO2, Arterial 93 83.0 - 108.0 mmHg   Bicarbonate 27.7 20.0 - 28.0 mmol/L   Acid-Base Excess 3.7 (H) 0.0 - 2.0 mmol/L   O2 Saturation 97.6 %   Patient temperature 37.0    Collection site RIGHT RADIAL    Sample type ARTERIAL DRAW    Allens test (pass/fail) PASS PASS  Carboxyhemoglobin (single result)  Result Value Ref Range   Carboxyhemoglobin 6.0 (HH) 0.5 - 1.5 %  Troponin I (High Sensitivity)  Result Value Ref Range   Troponin I (High Sensitivity) 6 <18 ng/L      Assessment & Plan:   Problem List Items Addressed This Visit    None    Visit Diagnoses     Acute right-sided low back pain without sciatica    -  Primary   Relevant Medications   naproxen (NAPROSYN) 500 MG tablet   Primary osteoarthritis of both knees       Relevant Medications   naproxen (NAPROSYN) 500 MG tablet   Primary osteoarthritis involving multiple joints       Relevant Medications   naproxen (NAPROSYN) 500 MG tablet       Acute on chronic R LBP without associated sciatica. Suspect likely due to muscle spasm/strain, with recent heavy lifting injury at work. Known history of OA/DJD Also bilateral knee pain arthritis history - No red flag symptoms. Negative SLR for radiculopathy Not responding to conservative therapy  Plan: 1. Start anti-inflammatory trial with rx Naprosyn 500mg  BID wc x 2 weeks, then PRN 2. May use existing Tramadol and Flexeril PRN - caution sedation 3. May use Tylenol PRN for breakthrough 4. Encouraged use of heating pad 1-2x daily for now then PRN 5.  Follow-up 4-6 weeks if not improved for re-evaluation, consider X-ray imaging, trial of PT, and possibly referral to Orthopedic    Meds ordered this encounter  Medications  .  naproxen (NAPROSYN) 500 MG tablet    Sig: Take 1 tablet (500 mg total) by mouth 2 (two) times daily with a meal. For 2 weeks then as needed    Dispense:  60 tablet    Refill:  0     Follow up plan: Return in about 4 weeks (around 07/30/2019), or if symptoms worsen or fail to improve, for back and knee pain.  Nobie Putnam, [facility] 07/02/2019, 2:54 PM

## 2019-07-02 NOTE — Patient Instructions (Addendum)
Thank you for coming to the office today.  1. For your Back Pain - I think that this is due to Muscle Spasms or strain.  2. Start with anti-inflammatory Naprosyn (Naproxen) 500mg  twice daily (12 hrs apart, with food, breakfast and dinner) every day for next 2 weeks if helping, then can use only as needed 3. May still use the meds from ED - Tramadol and Cyclobenzapine (Flexeril) 10mg  tablets - cut in half for 5mg  at night for muscle relaxant - may make you sedated or sleepy (be careful driving or working on this) if tolerated you can take every 8 hours, half or whole tab 4. May use Tylenol Extra Str 500mg  tabs - may take 1-2 tablets every 6 hours as needed 5. Recommend to start using heating pad on your lower back 1-2x daily for few weeks  Also try a Wedge Seat Cushion to avoid nerve pinching when sitting prolonged period of time.  This pain may take weeks to months to fully resolve, but hopefully it will respond to the medicine initially. All back injuries (small or serious) are slow to heal since we use our back muscles every day. Be careful with turning, twisting, lifting, sitting / standing for prolonged periods, and avoid re-injury.  If your symptoms significantly worsen with more pain, or new symptoms with weakness in one or both legs, new or different shooting leg pains, numbness in legs or groin, loss of control or retention of urine or bowel movements, please call back for advice and you may need to go directly to the Emergency Department.   Please schedule a Follow-up Appointment to: Return in about 4 weeks (around 07/30/2019), or if symptoms worsen or fail to improve, for back and knee pain.  If you have any other questions or concerns, please feel free to call the office or send a message through [place]. You may also schedule an earlier appointment if necessary.  Additionally, you may be receiving a survey about your experience at our office within a few days to 1 week by e-mail or mail.  We value your feedback.  Nobie Putnam, DO [facility], [facility]              Low Back Pain Exercises  See other page with pictures of each exercise.  Start with 1 or 2 of these exercises that you are most comfortable with. Do not do any exercises that cause you significant worsening pain. Some of these may cause some "stretching soreness" but it should go away after you stop the exercise, and get better over time. Gradually increase up to 3-4 exercises as tolerated.  Standing hamstring stretch: Place the heel of your leg on a stool about 15 inches high. Keep your knee straight. Lean forward, bending at the hips until you feel a mild stretch in the back of your thigh. Make sure you do not roll your shoulders and bend at the waist when doing this or you will stretch your lower back instead. Hold the stretch for 15 to 30 seconds. Repeat 3 times. Repeat the same stretch on your other leg.  Cat and camel: Get down on your hands and knees. Let your stomach sag, allowing your back to curve downward. Hold this position for 5 seconds. Then arch your back and hold for 5 seconds. Do 3 sets of 10.  Quadriped Arm/Leg Raises: Get down on your hands and knees. Tighten your abdominal muscles to stiffen your spine. While keeping your abdominals tight, raise one  arm and the opposite leg away from you. Hold this position for 5 seconds. Lower your arm and leg slowly and alternate sides. Do this 10 times on each side.  Pelvic tilt: Lie on your back with your knees bent and your feet flat on the floor. Tighten your abdominal muscles and push your lower back into the floor. Hold this position for 5 seconds, then relax. Do 3 sets of 10.  Partial curl: Lie on your back with your knees bent and your feet flat on the floor. Tighten your stomach muscles and flatten your back against the floor. Tuck your chin to your chest. With your hands stretched out in front of you, curl your upper body  forward until your shoulders clear the floor. Hold this position for 3 seconds. Don't hold your breath. It helps to breathe out as you lift your shoulders up. Relax. Repeat 10 times. Build to 3 sets of 10. To challenge yourself, clasp your hands behind your head and keep your elbows out to the side.  Lower trunk rotation: Lie on your back with your knees bent and your feet flat on the floor. Tighten your abdominal muscles and push your lower back into the floor. Keeping your shoulders down flat, gently rotate your legs to one side, then the other as far as you can. Repeat 10 to 20 times.  Single knee to chest stretch: Lie on your back with your legs straight out in front of you. Bring one knee up to your chest and grasp the back of your thigh. Pull your knee toward your chest, stretching your buttock muscle. Hold this position for 15 to 30 seconds and return to the starting position. Repeat 3 times on each side.  Double knee to chest: Lie on your back with your knees bent and your feet flat on the floor. Tighten your abdominal muscles and push your lower back into the floor. Pull both knees up to your chest. Hold for 5 seconds and repeat 10 to 20 times.

## 2019-07-25 ENCOUNTER — Other Ambulatory Visit: Payer: Self-pay | Admitting: Family Medicine

## 2019-07-25 DIAGNOSIS — M159 Polyosteoarthritis, unspecified: Secondary | ICD-10-CM

## 2019-07-25 DIAGNOSIS — M8949 Other hypertrophic osteoarthropathy, multiple sites: Secondary | ICD-10-CM

## 2019-07-25 DIAGNOSIS — M545 Low back pain, unspecified: Secondary | ICD-10-CM

## 2019-07-25 DIAGNOSIS — M170 Bilateral primary osteoarthritis of knee: Secondary | ICD-10-CM

## 2019-08-11 ENCOUNTER — Other Ambulatory Visit: Payer: Self-pay | Admitting: Nurse Practitioner

## 2019-08-11 DIAGNOSIS — E119 Type 2 diabetes mellitus without complications: Secondary | ICD-10-CM

## 2019-08-11 DIAGNOSIS — E782 Mixed hyperlipidemia: Secondary | ICD-10-CM

## 2019-08-13 NOTE — Telephone Encounter (Signed)
I attempted to contact the patient to schedule a f/u appt, no answer. LMOM to return my call.

## 2019-08-24 ENCOUNTER — Ambulatory Visit: Payer: Commercial Managed Care - PPO | Attending: Internal Medicine

## 2019-08-24 DIAGNOSIS — Z23 Encounter for immunization: Secondary | ICD-10-CM

## 2019-08-24 NOTE — Progress Notes (Signed)
   Covid-19 Vaccination Clinic  Name:  ALDYN SWENOR    MRN: [id number] DOB: 29-May-1955  08/24/2019  Mr. Arnzen was observed post Covid-19 immunization for 15 minutes without incident. He was provided with Vaccine Information Sheet and instruction to access the V-Safe system.   Mr. Vaswani was instructed to call 911 with any severe reactions post vaccine: Marland Kitchen Difficulty breathing  . Swelling of face and throat  . A fast heartbeat  . A bad rash all over body  . Dizziness and weakness   Immunizations Administered    Name Date Dose VIS Date Route   Pfizer COVID-19 Vaccine 08/24/2019  1:05 PM 0.3 mL 05/04/2019 Intramuscular   Manufacturer: [place]   Lot: [id number]   [place]: [id number]

## 2019-09-14 ENCOUNTER — Ambulatory Visit: Payer: Commercial Managed Care - PPO | Attending: Internal Medicine

## 2019-09-14 DIAGNOSIS — Z23 Encounter for immunization: Secondary | ICD-10-CM

## 2019-09-14 NOTE — Progress Notes (Signed)
   Covid-19 Vaccination Clinic  Name:  Craig Bruce    MRN: [id number] DOB: 1956-02-06  09/14/2019  [name] was observed post Covid-19 immunization for 15 minutes without incident. He was provided with Vaccine Information Sheet and instruction to access the V-Safe system.   [name] was instructed to call 911 with any severe reactions post vaccine: Marland Kitchen Difficulty breathing  . Swelling of face and throat  . A fast heartbeat  . A bad rash all over body  . Dizziness and weakness   Immunizations Administered    Name Date Dose VIS Date Route   Pfizer COVID-19 Vaccine 09/14/2019 12:11 PM 0.3 mL 07/18/2018 Intramuscular   Manufacturer: [place]   Lot: [id number]   [place]: [id number]

## 2019-11-02 ENCOUNTER — Other Ambulatory Visit: Payer: Self-pay | Admitting: Nurse Practitioner

## 2019-11-02 DIAGNOSIS — M1732 Unilateral post-traumatic osteoarthritis, left knee: Secondary | ICD-10-CM

## 2019-12-18 ENCOUNTER — Encounter: Payer: Self-pay | Admitting: Family Medicine

## 2019-12-26 ENCOUNTER — Encounter: Payer: Self-pay | Admitting: Family Medicine

## 2019-12-26 ENCOUNTER — Other Ambulatory Visit: Payer: Self-pay

## 2019-12-26 ENCOUNTER — Ambulatory Visit (INDEPENDENT_AMBULATORY_CARE_PROVIDER_SITE_OTHER): Payer: Commercial Managed Care - PPO | Admitting: Family Medicine

## 2019-12-26 VITALS — BP 147/89 | HR 75 | Temp 97.5°F | Resp 16 | Ht 69.0 in | Wt 212.6 lb

## 2019-12-26 DIAGNOSIS — M67912 Unspecified disorder of synovium and tendon, left shoulder: Secondary | ICD-10-CM | POA: Diagnosis not present

## 2019-12-26 DIAGNOSIS — G589 Mononeuropathy, unspecified: Secondary | ICD-10-CM

## 2019-12-26 DIAGNOSIS — M150 Primary generalized (osteo)arthritis: Secondary | ICD-10-CM

## 2019-12-26 DIAGNOSIS — M8949 Other hypertrophic osteoarthropathy, multiple sites: Secondary | ICD-10-CM

## 2019-12-26 DIAGNOSIS — M545 Low back pain, unspecified: Secondary | ICD-10-CM

## 2019-12-26 DIAGNOSIS — M25512 Pain in left shoulder: Secondary | ICD-10-CM

## 2019-12-26 DIAGNOSIS — M159 Polyosteoarthritis, unspecified: Secondary | ICD-10-CM

## 2019-12-26 DIAGNOSIS — W19XXXA Unspecified fall, initial encounter: Secondary | ICD-10-CM | POA: Diagnosis not present

## 2019-12-26 DIAGNOSIS — M170 Bilateral primary osteoarthritis of knee: Secondary | ICD-10-CM

## 2019-12-26 MED ORDER — CYCLOBENZAPRINE HCL 10 MG PO TABS: 10.0000 mg | ORAL_TABLET | Freq: Three times a day (TID) | ORAL | 0 refills | Status: DC | PRN

## 2019-12-26 MED ORDER — GABAPENTIN 100 MG PO CAPS: ORAL_CAPSULE | ORAL | 1 refills | Status: DC

## 2019-12-26 MED ORDER — NAPROXEN 500 MG PO TABS: 500.0000 mg | ORAL_TABLET | Freq: Two times a day (BID) | ORAL | 0 refills | Status: DC

## 2019-12-26 NOTE — Patient Instructions (Addendum)
Thank you for coming to the office today.  For shoulder  [facility] (formerly [facility]) Address: [address], [place], [place] [address] Hours:  9AM-5PM Phone: [phone]  Recommend trial of Anti-inflammatory with Naproxen (Naprosyn) 500mg  tabs - take one with food and plenty of water TWICE daily every day (breakfast and dinner), for next 2 to 4 weeks, then you may take only as needed - DO NOT TAKE any ibuprofen, aleve, motrin while you are taking this medicine - It is safe to take Tylenol Ext Str 500mg  tabs - take 1 to 2 (max dose 1000mg ) every 6 hours as needed for breakthrough pain, max 24 hour daily dose is 6 to 8 tablets or 4000mg   Start Cyclobenzapine (Flexeril) 10mg  tablets (muscle relaxant) - start with half (cut) to one whole pill at night for muscle relaxant - may make you sedated or sleepy (be careful driving or working on this) if tolerated you can take half to whole tab 2 to 3 times daily or every 8 hours as needed    Your provider would like to you have your annual eye exam. Please contact your current eye doctor or here are some good options for you to contact.   [facility]   Address: [address] [place], [place] [address] Phone: [phone]  Website: [email or web address] [address], [place], [place] [address] Phone: [phone] [email or web address]  [facility]  Address: [address], [place], [place] [address] Phone: [phone]   [facility] [address] [place], [place] [address] Phone: [phone]  [facility] Address: [place], [place], [place] [address]  Phone: [phone]     Please schedule a Follow-up Appointment to: Return in about 6 weeks (around 02/06/2020) for offer to see nicole, otherwise i can see him 6 week f/u Diabetes A1c, Foot Exam, DM Eye, refills.  If you have any other questions or concerns, please feel free to call the office or send a  message through [place]. You may also schedule an earlier appointment if necessary.  Additionally, you may be receiving a survey about your experience at our office within a few days to 1 week by e-mail or mail. We value your feedback.  Nobie Putnam, DO [place]

## 2019-12-26 NOTE — Progress Notes (Signed)
Subjective:    Patient ID: Craig Bruce, male    DOB: Dec 18, 1955, 64 y.o.   MRN: [id number]  Craig Bruce is a 64 y.o. 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 Admits weakness due to pain only. Limited range of motion. No prior shoulder injury on L side Denies numbness tingling   Depression screen [facility] 2/9 12/26/2019 12/12/2018 10/31/2018  Decreased Interest 0 0 0  Down, Depressed, Hopeless 0 0 0  PHQ - 2 Score 0 0 0  Altered sleeping - - -  Tired, decreased energy - - -  Change in appetite - - -  Feeling bad or failure about yourself  - - -  Trouble concentrating - - -  Moving slowly or fidgety/restless - - -  Suicidal thoughts - - -  PHQ-9 Score - - -    Social History   Tobacco Use  . Smoking status: Current Every Day Smoker    Packs/day: 0.25    Years: 20.00    Pack years: 5.00    Types: Cigarettes  . Smokeless tobacco: Current User  . Tobacco comment: Previously smoked 1ppd.  Cutting back and down from 5 cigs / day to 4 cigs per day in 4 weeks.  Vaping Use  . Vaping Use: Never used  Substance Use Topics  . Alcohol use: Yes    Alcohol/week: 2.0 standard drinks    Types: 2 Cans of beer per week    Comment: BEER OCC  . Drug use: No    Review of Systems Per HPI unless specifically indicated above     Objective:    BP (!) 147/89   Pulse 75   Temp (!) 97.5 F (36.4 C) (Temporal)   Resp 16   Ht 5\' 9"  (1.753 m)   Wt 212 lb 9.6 oz (96.4 kg)   SpO2 98%   BMI 31.40 kg/m     Wt Readings from Last 3 Encounters:  12/26/19 212 lb 9.6 oz (96.4 kg)  07/02/19 217 lb (98.4 kg)  06/29/19 210 lb (95.3 kg)    Physical Exam Vitals and nursing note reviewed.  Constitutional:      General: He is not in acute distress.    Appearance: He is well-developed. He is not diaphoretic.     Comments: Well-appearing, comfortable, cooperative  HENT:     Head: Normocephalic and atraumatic.  Eyes:     General:        Right eye: No discharge.        Left eye: No discharge.     Conjunctiva/sclera: Conjunctivae normal.  Cardiovascular:     Rate and Rhythm: Normal rate.  Pulmonary:     Effort: Pulmonary effort is normal.  Musculoskeletal:     Comments: Left Shoulder Inspection: Normal appearance bilateral symmetrical Palpation: Tender to palpation over anterior shoulder ROM: Reduced active ROM forward flex above shoulder and internal rotation behind back., some reduced abduction Special Testing: Rotator cuff testing positive for weakness with supraspinatus full can and  empty can test, impingement testing shows some pain but not significant Strength: Normal strength 5/5 flex/ext, ext rot / int rot, grip, rotator cuff str testing. Neurovascular: Distally intact pulses, sensation to light touch   Skin:    General: Skin is warm and dry.     Findings: No erythema or rash.  Neurological:     Mental Status: He is alert and oriented to person, place, and time.  Psychiatric:        Behavior: Behavior normal.     Comments: Well groomed, good eye contact, normal speech and thoughts    Results for orders placed or performed during the hospital encounter of 12/09/18  SARS Coronavirus 2 (CEPHEID - Performed in [facility] hospital lab), [facility] Order   Specimen: Nasopharyngeal Swab  Result Value Ref Range   SARS Coronavirus 2 NEGATIVE NEGATIVE  CBC with Differential  Result Value Ref Range   WBC 7.6 4.0 - 10.5 K/uL   RBC 5.13 4.22 - 5.81 MIL/uL   Hemoglobin 16.4 13.0 - 17.0 g/dL    HCT 45.5 39 - 52 %   MCV 88.7 80.0 - 100.0 fL   MCH 32.0 26.0 - 34.0 pg   MCHC 36.0 30.0 - 36.0 g/dL   RDW 12.1 11.5 - 15.5 %   Platelets 210 150 - 400 K/uL   nRBC 0.0 0.0 - 0.2 %   Neutrophils Relative % 60 %   Neutro Abs 4.5 1.7 - 7.7 K/uL   Lymphocytes Relative 29 %   Lymphs Abs 2.2 0.7 - 4.0 K/uL   Monocytes Relative 10 %   Monocytes Absolute 0.8 0 - 1 K/uL   Eosinophils Relative 1 %   Eosinophils Absolute 0.1 0 - 0 K/uL   Basophils Relative 0 %   Basophils Absolute 0.0 0 - 0 K/uL   Immature Granulocytes 0 %   Abs Immature Granulocytes 0.02 0.00 - 0.07 K/uL  Comprehensive metabolic panel  Result Value Ref Range   Sodium 141 135 - 145 mmol/L   Potassium 3.6 3.5 - 5.1 mmol/L   Chloride 108 98 - 111 mmol/L   CO2 24 22 - 32 mmol/L   Glucose, Bld 120 (H) 70 - 99 mg/dL   BUN 11 8 - 23 mg/dL   Creatinine, Ser 0.72 0.61 - 1.24 mg/dL   Calcium 8.8 (L) 8.9 - 10.3 mg/dL   Total Protein 7.1 6.5 - 8.1 g/dL   Albumin 4.3 3.5 - 5.0 g/dL   AST 32 15 - 41 U/L   ALT 34 0 - 44 U/L   Alkaline Phosphatase 78 38 - 126 U/L   Total Bilirubin 1.3 (H) 0.3 - 1.2 mg/dL   GFR calc non Af Amer >60 >60 mL/min   GFR calc Af Amer >60 >60 mL/min   Anion gap 9 5 - 15  Ethanol  Result Value Ref Range   Alcohol, Ethyl (B) <10 <10 mg/dL  Acetaminophen level  Result Value Ref Range   Acetaminophen (Tylenol), Serum <10 (L) 10 - 30 ug/mL  Salicylate level  Result Value Ref Range   Salicylate Lvl <9.4 2.8 - 30.0 mg/dL  Lactic acid, plasma  Result Value Ref Range   Lactic Acid, Venous 1.2 0.5 - 1.9 mmol/L  Lipase, blood  Result Value Ref Range   Lipase 28 11 - 51 U/L  Urinalysis, Complete w Microscopic  Result Value Ref Range   Color, Urine YELLOW (A) YELLOW   APPearance CLEAR (A) CLEAR   Specific Gravity, Urine 1.009 1.005 -  1.030   pH 7.0 5.0 - 8.0   Glucose, UA NEGATIVE NEGATIVE mg/dL   Hgb urine dipstick NEGATIVE NEGATIVE   Bilirubin Urine NEGATIVE NEGATIVE   Ketones, ur NEGATIVE NEGATIVE  mg/dL   Protein, ur NEGATIVE NEGATIVE mg/dL   Nitrite NEGATIVE NEGATIVE   Leukocytes,Ua NEGATIVE NEGATIVE   RBC / HPF 0-5 0 - 5 RBC/hpf   WBC, UA 0-5 0 - 5 WBC/hpf   Bacteria, UA NONE SEEN NONE SEEN   Squamous Epithelial / LPF NONE SEEN 0 - 5   Mucus PRESENT   Urine Drug Screen, Qualitative  Result Value Ref Range   Tricyclic, Ur Screen NONE DETECTED NONE DETECTED   Amphetamines, Ur Screen NONE DETECTED NONE DETECTED   MDMA (Ecstasy)Ur Screen NONE DETECTED NONE DETECTED   Cocaine Metabolite,Ur [place] NONE DETECTED NONE DETECTED   Opiate, Ur Screen NONE DETECTED NONE DETECTED   Phencyclidine (PCP) Ur S NONE DETECTED NONE DETECTED   Cannabinoid 50 Ng, Ur [place] NONE DETECTED NONE DETECTED   Barbiturates, Ur Screen NONE DETECTED NONE DETECTED   Benzodiazepine, Ur Scrn NONE DETECTED NONE DETECTED   Methadone Scn, Ur NONE DETECTED NONE DETECTED  Blood gas, arterial  Result Value Ref Range   FIO2 0.21    pH, Arterial 7.46 (H) 7.35 - 7.45   pCO2 arterial 39 32 - 48 mmHg   pO2, Arterial 93 83 - 108 mmHg   Bicarbonate 27.7 20.0 - 28.0 mmol/L   Acid-Base Excess 3.7 (H) 0.0 - 2.0 mmol/L   O2 Saturation 97.6 %   Patient temperature 37.0    Collection site RIGHT RADIAL    Sample type ARTERIAL DRAW    Allens test (pass/fail) PASS PASS  Carboxyhemoglobin (single result)  Result Value Ref Range   Carboxyhemoglobin 6.0 (HH) 0.5 - 1.5 %  Troponin I (High Sensitivity)  Result Value Ref Range   Troponin I (High Sensitivity) 6 <18 ng/L      Assessment & Plan:   Problem List Items Addressed This Visit    None    Visit Diagnoses    Acute pain of left shoulder    -  Primary   Relevant Medications   cyclobenzaprine (FLEXERIL) 10 MG tablet   Other Relevant Orders   Ambulatory referral to Orthopedic Surgery   Fall, initial encounter       Relevant Orders   Ambulatory referral to Orthopedic Surgery   Tendinopathy of left rotator cuff       Relevant Orders   Ambulatory referral to Orthopedic  Surgery   Acute right-sided low back pain without sciatica       Relevant Medications   cyclobenzaprine (FLEXERIL) 10 MG tablet   naproxen (NAPROSYN) 500 MG tablet   Primary osteoarthritis involving multiple joints       Relevant Medications   cyclobenzaprine (FLEXERIL) 10 MG tablet   naproxen (NAPROSYN) 500 MG tablet   Primary osteoarthritis of both knees       Relevant Medications   cyclobenzaprine (FLEXERIL) 10 MG tablet   naproxen (NAPROSYN) 500 MG tablet   Mononeuropathy       Relevant Medications   cyclobenzaprine (FLEXERIL) 10 MG tablet   gabapentin (NEURONTIN) 100 MG capsule      Consistent with subacute L-shoulder bursitis vs rotator cuff tendinopathy with some reduced active ROM and concern with some weakness due to pain. Cannot rule out rotator cuff tendinopathy or injury now At risk of frozen shoulder 6 weeks out from injury Possible bursitis but history and exam not  entirely supportive. He is still working does repetitive work still Known OA/DJD other joints  Plan: 1.Stop meloxicam -  Start rx Naproxen 500mg  twice daily (with food) for 2 weeks, then as needed 2. May take Tylenol Ex Str 1-2 q 6 hr PRN - Flexeril 5-10mg  TID PRN caution sedation ADD Gabapentin 100mg  titration dosing for some neuropathic symptoms 3. Relative rest but keep shoulder mobile, may do ROM exercises, avoid heavy lifting 4. May try heating pad PRN  Referral to [facility] for further management, given delay to care already 6 weeks, lack of improvement. Will defer imaging to orthopedic at this point, and can pursue PT vs Injection vs other options. May warrant MRI in future.   Orders Placed This Encounter  Procedures  . Ambulatory referral to Orthopedic Surgery    Referral Priority:   Routine    Referral Type:   Surgical    Referral Reason:   Specialty Services Required    Requested Specialty:   Orthopedic Surgery    Number of Visits Requested:   1      Meds ordered this encounter    Medications  . cyclobenzaprine (FLEXERIL) 10 MG tablet    Sig: Take 1 tablet (10 mg total) by mouth 3 (three) times daily as needed for muscle spasms.    Dispense:  60 tablet    Refill:  0  . naproxen (NAPROSYN) 500 MG tablet    Sig: Take 1 tablet (500 mg total) by mouth 2 (two) times daily with a meal. For 2 weeks then as needed    Dispense:  60 tablet    Refill:  0  . gabapentin (NEURONTIN) 100 MG capsule    Sig: Start 1 capsule daily, increase by 1 cap every 2-3 days as tolerated up to 3 times a day, or may take 3 at once in evening.    Dispense:  90 capsule    Refill:  1      Follow up plan: Return in about 6 weeks (around 02/06/2020) for offer to see nicole, otherwise i can see him 6 week f/u Diabetes A1c, Foot Exam, DM Eye, refills.   Nobie Putnam, [facility] 12/26/2019, 8:44 AM

## 2020-01-14 DIAGNOSIS — M7542 Impingement syndrome of left shoulder: Secondary | ICD-10-CM | POA: Insufficient documentation

## 2020-02-04 ENCOUNTER — Other Ambulatory Visit: Payer: Self-pay | Admitting: Nurse Practitioner

## 2020-02-04 DIAGNOSIS — E119 Type 2 diabetes mellitus without complications: Secondary | ICD-10-CM

## 2020-02-06 NOTE — Telephone Encounter (Signed)
Appt 02/08/2020

## 2020-02-08 ENCOUNTER — Encounter: Payer: Self-pay | Admitting: Family Medicine

## 2020-02-08 ENCOUNTER — Ambulatory Visit (INDEPENDENT_AMBULATORY_CARE_PROVIDER_SITE_OTHER): Payer: Commercial Managed Care - PPO | Admitting: Family Medicine

## 2020-02-08 ENCOUNTER — Other Ambulatory Visit: Payer: Self-pay

## 2020-02-08 DIAGNOSIS — E119 Type 2 diabetes mellitus without complications: Secondary | ICD-10-CM | POA: Diagnosis not present

## 2020-02-08 DIAGNOSIS — R05 Cough: Secondary | ICD-10-CM

## 2020-02-08 DIAGNOSIS — R059 Cough, unspecified: Secondary | ICD-10-CM | POA: Insufficient documentation

## 2020-02-08 MED ORDER — LISINOPRIL 5 MG PO TABS: 5.0000 mg | ORAL_TABLET | Freq: Every day | ORAL | 1 refills | Status: AC

## 2020-02-08 NOTE — Progress Notes (Signed)
Virtual Visit via Telephone  The purpose of this virtual visit is to provide medical care while limiting exposure to the novel coronavirus (COVID19) for both patient and office staff.  Consent was obtained for phone visit:  Yes.   Answered questions that patient had about telehealth interaction:  Yes.   I discussed the limitations, risks, security and privacy concerns of performing an evaluation and management service by telephone. I also discussed with the patient that there may be a patient responsible charge related to this service. The patient expressed understanding and agreed to proceed.  Patient is at home and is accessed via telephone Services are provided by Harlin Rain, FNP-C from [facility])  ---------------------------------------------------------------------- Chief Complaint  Patient presents with  . Diabetes  . Cough    pt state he developed a cough x 1 week ago    S: Reviewed CMA documentation. I have called patient and gathered additional HPI as follows:  Mr. [place] presents for telephonic virtual visit.  Had presented to clinic and found to have a cough and was converted to telephonic visit due to concerns for COVID.  Reports that he has been exposed to someone that has been coughing and he has had an intermittent productive cough x 7 days.  Denies fever, sore throat, change in taste/smell, SOB, CP, abdominal pain, n/v/d.  Has not been tested for COVID and has had Pfizer vaccines.  Had been scheduled for a visit today for follow up on his diet controlled diabetes.  Has had his last A1C 10/25/2018 and was 6.0%.  Will order repeat labs and will have patient follow up in office in > 2 weeks for foot exam and to discuss preventative care screenings due for.  Patient is currently working.  Encouraged COVID testing and then based on results to determine if needs to quarantine.  Denies any high risk travel to areas of current concern for  COVID19.  Past Medical History:  Diagnosis Date  . Annual physical exam 03/24/2015  . Arthritis    BOTH HANDS  . Chronic kidney disease    H/O KIDNEY STONES  . DVT (deep venous thrombosis) ([place]) 1996  . Gastritis   . Immunization due 03/24/2015  . Need for influenza vaccination 03/24/2015  . Obesity (BMI 30.0-34.9) 02/17/2015  . Smoker 02/17/2015   Social History   Tobacco Use  . Smoking status: Current Every Day Smoker    Packs/day: 0.25    Years: 20.00    Pack years: 5.00    Types: Cigarettes  . Smokeless tobacco: Never Used  . Tobacco comment: Previously smoked 1ppd.  Cutting back and down from 5 cigs / day to 4 cigs per day in 4 weeks.  Vaping Use  . Vaping Use: Never used  Substance Use Topics  . Alcohol use: Yes    Alcohol/week: 2.0 standard drinks    Types: 2 Cans of beer per week    Comment: BEER OCC  . Drug use: No    Current Outpatient Medications:  .  atorvastatin (LIPITOR) 40 MG tablet, TAKE 1 TABLET (40 MG TOTAL) DAILY BY MOUTH., Disp: 90 tablet, Rfl: 4 .  cyclobenzaprine (FLEXERIL) 10 MG tablet, Take 1 tablet (10 mg total) by mouth 3 (three) times daily as needed for muscle spasms., Disp: 60 tablet, Rfl: 0 .  ezetimibe (ZETIA) 10 MG tablet, TAKE 1 TABLET BY MOUTH EVERY DAY, Disp: 90 tablet, Rfl: 1 .  gabapentin (NEURONTIN) 100 MG capsule, Start 1 capsule daily, increase  by 1 cap every 2-3 days as tolerated up to 3 times a day, or may take 3 at once in evening., Disp: 90 capsule, Rfl: 1 .  lisinopril (ZESTRIL) 5 MG tablet, Take 1 tablet (5 mg total) by mouth daily., Disp: 90 tablet, Rfl: 1 .  meloxicam (MOBIC) 15 MG tablet, Mobic 15 mg tablet  Take 1 tablet every day by oral route., Disp: , Rfl:  .  methocarbamol (ROBAXIN) 500 MG tablet, Take 500 mg by mouth 2 (two) times daily., Disp: , Rfl:  .  ipratropium (ATROVENT) 0.06 % nasal spray, Place 2 sprays into both nostrils 4 (four) times daily. (Patient not taking: Reported on 02/08/2020), Disp: 15 mL, Rfl: 12 .   naproxen (NAPROSYN) 500 MG tablet, Take 1 tablet (500 mg total) by mouth 2 (two) times daily with a meal. For 2 weeks then as needed (Patient not taking: Reported on 02/08/2020), Disp: 60 tablet, Rfl: 0  Depression screen [facility] 2/9 12/26/2019 12/12/2018 10/31/2018  Decreased Interest 0 0 0  Down, Depressed, Hopeless 0 0 0  PHQ - 2 Score 0 0 0  Altered sleeping - - -  Tired, decreased energy - - -  Change in appetite - - -  Feeling bad or failure about yourself  - - -  Trouble concentrating - - -  Moving slowly or fidgety/restless - - -  Suicidal thoughts - - -  PHQ-9 Score - - -    No flowsheet data found.  -------------------------------------------------------------------------- O: No physical exam performed due to remote telephone encounter.  Physical Exam: Patient remotely monitored without video.  Verbal communication appropriate.  Cognition normal.  No results found for this or any previous visit (from the past 2160 hour(s)).  -------------------------------------------------------------------------- A&P:  Problem List Items Addressed This Visit      Endocrine   Diet-controlled type 2 diabetes mellitus ([place]) - Primary    Status unknown.  Recheck labs.  Continue lifestyle modifications. To have labs checked after having testing for COVID due to new onset cough.  Followup after labs.       Relevant Medications   lisinopril (ZESTRIL) 5 MG tablet     Other   Cough    New onset cough x 7 days.  Has been exposed to someone that has been sick with a cough, has not had testing for COVID.  Reports has had both of his [place] vaccines, that his cough is intermittent and sometimes productive.  Encouraged to have COVID testing for evaluation.  Plan: 1. Have COVID testing, provided information for [facility], and then testing next week with [employer] in [place] on Monday.         Meds ordered this encounter  Medications  . lisinopril (ZESTRIL) 5 MG tablet     Sig: Take 1 tablet (5 mg total) by mouth daily.    Dispense:  90 tablet    Refill:  1    Follow-up: - Return in > 2 weeks for in office diabetes follow up visit   Patient verbalizes understanding with the above medical recommendations including the limitation of remote medical advice.  Specific follow-up and call-back criteria were given for patient to follow-up or seek medical care more urgently if needed.  - Time spent in direct consultation with patient on phone: 6 minutes  Harlin Rain, [facility] 02/08/2020, 4:01 PM

## 2020-02-08 NOTE — Patient Instructions (Addendum)
As we discussed, I encourage you to have COVID testing due to your new onset cough within the last 7 days with sick contact exposure.  We can plan to have your labs drawn in the next 2 weeks and we will contact you with the results.  We will plan to see you back in 4 weeks for diabetes follow up visit  You will receive a survey after today's visit either digitally by e-mail or paper by [place] mail. Your experiences and feedback matter to [place].  Please respond so we know how we are doing as we provide care for you.  Call us with any questions/concerns/needs.  It is my goal to be available to you for your health concerns.  Thanks for choosing me to be a partner in your healthcare needs!  Harlin Rain, FNP-C Family Nurse Practitioner [facility] Phone: [phone]

## 2020-02-08 NOTE — Assessment & Plan Note (Signed)
Status unknown.  Recheck labs.  Continue lifestyle modifications. To have labs checked after having testing for COVID due to new onset cough.  Followup after labs.

## 2020-02-08 NOTE — Assessment & Plan Note (Signed)
New onset cough x 7 days.  Has been exposed to someone that has been sick with a cough, has not had testing for COVID.  Reports has had both of his [place] vaccines, that his cough is intermittent and sometimes productive.  Encouraged to have COVID testing for evaluation.  Plan: 1. Have COVID testing, provided information [facility], and then testing next week with [employer] [facility] in [place] on Monday.

## 2020-07-29 ENCOUNTER — Other Ambulatory Visit: Payer: Self-pay

## 2020-07-29 ENCOUNTER — Ambulatory Visit (INDEPENDENT_AMBULATORY_CARE_PROVIDER_SITE_OTHER): Payer: Medicare Other | Admitting: Family Medicine

## 2020-07-29 ENCOUNTER — Encounter: Payer: Self-pay | Admitting: Family Medicine

## 2020-07-29 VITALS — BP 140/90 | HR 85 | Temp 97.5°F | Ht 64.0 in | Wt 215.4 lb

## 2020-07-29 DIAGNOSIS — Z1211 Encounter for screening for malignant neoplasm of colon: Secondary | ICD-10-CM

## 2020-07-29 DIAGNOSIS — G4709 Other insomnia: Secondary | ICD-10-CM

## 2020-07-29 DIAGNOSIS — E119 Type 2 diabetes mellitus without complications: Secondary | ICD-10-CM | POA: Diagnosis not present

## 2020-07-29 NOTE — Progress Notes (Signed)
Subjective:    Patient ID: Craig Bruce, male    DOB: June 01, 1955, 65 y.o.   MRN: [id number]  Craig Bruce is a 65 y.o. male presenting on 07/29/2020 for Discuss Cologuard test, Colon Cancer Screening, and Insomnia  Here now to return to care after has retired and now on [employer].  HPI   Shift Work Sleep Disorder Insomnia He has recently retired in past year and has had difficulty with sleep at times, will wake up after sleeping for short period of time only, he does drink coffee often including in evening, also has cats that can wake him up, he takes Aleve PM occasionally.   Type 2 DM Previously diet controlled Not on medication for it No recent CBGs, admits not adherent to checking. No recent DM eye visit, needs to schedule Denies numbness tingling or neuropathy  Hyperlipidemia  Prior meds Atorvastatin vs Zetia, unsure which one he is taking, will bring meds to next visit.  Elevated BP without dx HTN On Lisinopril 5mg  daily for renal protection in DM No home BP readings. Asymptomatic.  Health Maintenance:  Colon CA Screening: History of prior negative cologuard. Due for screening test, proceed with cologuard order.   Depression screen [facility] 2/9 12/26/2019 12/12/2018 10/31/2018  Decreased Interest 0 0 0  Down, Depressed, Hopeless 0 0 0  PHQ - 2 Score 0 0 0  Altered sleeping - - -  Tired, decreased energy - - -  Change in appetite - - -  Feeling bad or failure about yourself  - - -  Trouble concentrating - - -  Moving slowly or fidgety/restless - - -  Suicidal thoughts - - -  PHQ-9 Score - - -    Social History   Tobacco Use  . Smoking status: Current Every Day Smoker    Packs/day: 0.25    Years: 20.00    Pack years: 5.00    Types: Cigarettes  . Smokeless tobacco: Never Used  . Tobacco comment: Previously smoked 1ppd.  Cutting back and down from 5 cigs / day to 4 cigs per day in 4 weeks.  Vaping Use  . Vaping Use: Never used  Substance Use Topics  . Alcohol  use: Yes    Alcohol/week: 2.0 standard drinks    Types: 2 Cans of beer per week    Comment: BEER OCC  . Drug use: No    Review of Systems Per HPI unless specifically indicated above     Objective:    BP 140/90 (BP Location: Left Arm, Cuff Size: Normal)   Pulse 85   Temp (!) 97.5 F (36.4 C) (Temporal)   Ht 5\' 4"  (1.626 m)   Wt 215 lb 6.4 oz (97.7 kg)   SpO2 99%   BMI 36.97 kg/m   Wt Readings from Last 3 Encounters:  07/29/20 215 lb 6.4 oz (97.7 kg)  12/26/19 212 lb 9.6 oz (96.4 kg)  07/02/19 217 lb (98.4 kg)    Physical Exam Vitals and nursing note reviewed.  Constitutional:      General: He is not in acute distress.    Appearance: He is well-developed and well-nourished. He is obese. He is not diaphoretic.     Comments: Well-appearing, comfortable, cooperative  HENT:     Head: Normocephalic and atraumatic.     Mouth/Throat:     Mouth: Oropharynx is clear and moist.  Eyes:     General:        Right eye: No discharge.  Left eye: No discharge.     Conjunctiva/sclera: Conjunctivae normal.  Neck:     Thyroid: No thyromegaly.  Cardiovascular:     Rate and Rhythm: Normal rate and regular rhythm.     Pulses: Intact distal pulses.     Heart sounds: Normal heart sounds. No murmur heard.   Pulmonary:     Effort: Pulmonary effort is normal. No respiratory distress.     Breath sounds: Normal breath sounds. No wheezing or rales.  Musculoskeletal:        General: No edema. Normal range of motion.     Cervical back: Normal range of motion and neck supple.  Lymphadenopathy:     Cervical: No cervical adenopathy.  Skin:    General: Skin is warm and dry.     Findings: No erythema or rash.  Neurological:     Mental Status: He is alert and oriented to person, place, and time.  Psychiatric:        Mood and Affect: Mood and affect normal.        Behavior: Behavior normal.     Comments: Well groomed, good eye contact, normal speech and thoughts      Diabetic Foot  Exam - Simple   Simple Foot Form Diabetic Foot exam was performed with the following findings: Yes 07/29/2020  8:35 AM  Visual Inspection See comments: Yes Sensation Testing Intact to touch and monofilament testing bilaterally: Yes Pulse Check Posterior Tibialis and Dorsalis pulse intact bilaterally: Yes Comments Callus formation bilateral heels, no ulceration. Intact to monofilament.     Results for orders placed or performed during the hospital encounter of 12/09/18  SARS Coronavirus 2 (CEPHEID - Performed in [place] hospital lab), [facility] Order   Specimen: Nasopharyngeal Swab  Result Value Ref Range   SARS Coronavirus 2 NEGATIVE NEGATIVE  CBC with Differential  Result Value Ref Range   WBC 7.6 4.0 - 10.5 K/uL   RBC 5.13 4.22 - 5.81 MIL/uL   Hemoglobin 16.4 13.0 - 17.0 g/dL   HCT 45.5 39.0 - 52.0 %   MCV 88.7 80.0 - 100.0 fL   MCH 32.0 26.0 - 34.0 pg   MCHC 36.0 30.0 - 36.0 g/dL   RDW 12.1 11.5 - 15.5 %   Platelets 210 150 - 400 K/uL   nRBC 0.0 0.0 - 0.2 %   Neutrophils Relative % 60 %   Neutro Abs 4.5 1.7 - 7.7 K/uL   Lymphocytes Relative 29 %   Lymphs Abs 2.2 0.7 - 4.0 K/uL   Monocytes Relative 10 %   Monocytes Absolute 0.8 0.1 - 1.0 K/uL   Eosinophils Relative 1 %   Eosinophils Absolute 0.1 0.0 - 0.5 K/uL   Basophils Relative 0 %   Basophils Absolute 0.0 0.0 - 0.1 K/uL   Immature Granulocytes 0 %   Abs Immature Granulocytes 0.02 0.00 - 0.07 K/uL  Comprehensive metabolic panel  Result Value Ref Range   Sodium 141 135 - 145 mmol/L   Potassium 3.6 3.5 - 5.1 mmol/L   Chloride 108 98 - 111 mmol/L   CO2 24 22 - 32 mmol/L   Glucose, Bld 120 (H) 70 - 99 mg/dL   BUN 11 8 - 23 mg/dL   Creatinine, Ser 0.72 0.61 - 1.24 mg/dL   Calcium 8.8 (L) 8.9 - 10.3 mg/dL   Total Protein 7.1 6.5 - 8.1 g/dL   Albumin 4.3 3.5 - 5.0 g/dL   AST 32 15 - 41 U/L   ALT 34 0 -  44 U/L   Alkaline Phosphatase 78 38 - 126 U/L   Total Bilirubin 1.3 (H) 0.3 - 1.2 mg/dL   GFR calc non Af Amer  >60 >60 mL/min   GFR calc Af Amer >60 >60 mL/min   Anion gap 9 5 - 15  Ethanol  Result Value Ref Range   Alcohol, Ethyl (B) <10 <10 mg/dL  Acetaminophen level  Result Value Ref Range   Acetaminophen (Tylenol), Serum <10 (L) 10 - 30 ug/mL  Salicylate level  Result Value Ref Range   Salicylate Lvl <8.3 2.8 - 30.0 mg/dL  Lactic acid, plasma  Result Value Ref Range   Lactic Acid, Venous 1.2 0.5 - 1.9 mmol/L  Lipase, blood  Result Value Ref Range   Lipase 28 11 - 51 U/L  Urinalysis, Complete w Microscopic  Result Value Ref Range   Color, Urine YELLOW (A) YELLOW   APPearance CLEAR (A) CLEAR   Specific Gravity, Urine 1.009 1.005 - 1.030   pH 7.0 5.0 - 8.0   Glucose, UA NEGATIVE NEGATIVE mg/dL   Hgb urine dipstick NEGATIVE NEGATIVE   Bilirubin Urine NEGATIVE NEGATIVE   Ketones, ur NEGATIVE NEGATIVE mg/dL   Protein, ur NEGATIVE NEGATIVE mg/dL   Nitrite NEGATIVE NEGATIVE   Leukocytes,Ua NEGATIVE NEGATIVE   RBC / HPF 0-5 0 - 5 RBC/hpf   WBC, UA 0-5 0 - 5 WBC/hpf   Bacteria, UA NONE SEEN NONE SEEN   Squamous Epithelial / LPF NONE SEEN 0 - 5   Mucus PRESENT   Urine Drug Screen, Qualitative  Result Value Ref Range   Tricyclic, Ur Screen NONE DETECTED NONE DETECTED   Amphetamines, Ur Screen NONE DETECTED NONE DETECTED   MDMA (Ecstasy)Ur Screen NONE DETECTED NONE DETECTED   Cocaine Metabolite,Ur [place] NONE DETECTED NONE DETECTED   Opiate, Ur Screen NONE DETECTED NONE DETECTED   Phencyclidine (PCP) Ur S NONE DETECTED NONE DETECTED   Cannabinoid 50 Ng, Ur [place] NONE DETECTED NONE DETECTED   Barbiturates, Ur Screen NONE DETECTED NONE DETECTED   Benzodiazepine, Ur Scrn NONE DETECTED NONE DETECTED   Methadone Scn, Ur NONE DETECTED NONE DETECTED  Blood gas, arterial  Result Value Ref Range   FIO2 0.21    pH, Arterial 7.46 (H) 7.350 - 7.450   pCO2 arterial 39 32.0 - 48.0 mmHg   pO2, Arterial 93 83.0 - 108.0 mmHg   Bicarbonate 27.7 20.0 - 28.0 mmol/L   Acid-Base Excess 3.7 (H) 0.0 - 2.0  mmol/L   O2 Saturation 97.6 %   Patient temperature 37.0    Collection site RIGHT RADIAL    Sample type ARTERIAL DRAW    Allens test (pass/fail) PASS PASS  Carboxyhemoglobin (single result)  Result Value Ref Range   Carboxyhemoglobin 6.0 (HH) 0.5 - 1.5 %  Troponin I (High Sensitivity)  Result Value Ref Range   Troponin I (High Sensitivity) 6 <18 ng/L      Assessment & Plan:   Problem List Items Addressed This Visit    Other insomnia    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 and by history Now retired, hopeful to improve sleep Counseling on Sleep Hygiene today and given handout Advice to limit caffeine/coffee, was drinking in PM, also other poor routine w/ sleep Trial on Melatonin supplement up to 5mg  nightly      Morbid obesity ([place])    Morbid obesity with BMI >36 due to diabetes, hyperlipidemia co morbid factors      Diet-controlled type 2 diabetes mellitus ([place]) -  Primary    Previously diet controlled currently uncertain E5U status No home CBG readings He prefers to return for blood work at next visit DM Foot check today Recommend DM Eye exam       Other Visit Diagnoses    Colon cancer screening       Relevant Orders   Cologuard     Due for routine colon cancer screening.  Prior cologuard negative years ago - Discussion today about recommendations for either Colonoscopy or Cologuard screening, benefits and risks of screening, interested in Cologuard, understands that if positive then recommendation is for diagnostic colonoscopy to follow-up. - Ordered Cologuard today   Orders Placed This Encounter  Procedures  . Cologuard    No orders of the defined types were placed in this encounter.     Follow up plan: Return in about 3 months (around 10/29/2020) for 3 month AM apt w/ new provider - Yearly Medicare Checkup - bring all medicine bottles.  Due for fasting blood work at next visit. Nobie Putnam, [facility] 07/29/2020, 8:27 AM

## 2020-07-29 NOTE — Assessment & Plan Note (Signed)
Previously diet controlled currently uncertain X8V status No home CBG readings He prefers to return for blood work at next visit DM Foot check today Recommend DM Eye exam

## 2020-07-29 NOTE — Assessment & Plan Note (Signed)
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

## 2020-07-29 NOTE — Assessment & Plan Note (Signed)
Morbid obesity with BMI >36 due to diabetes, hyperlipidemia co morbid factors

## 2020-07-29 NOTE — Patient Instructions (Addendum)
Thank you for coming to the office today.  BRING ALL MEDS TO APT  Melatonin natural OTC supplement start with 1 mg nightly about 30 min before bed, can increase up to 5mg  is a good stopping, then maximum dose eventually 10mg  if you are doing well.  Try to avoid changing routine, make sure to lay down at appropriate time.  Colon Cancer Screening: Cologuard ordered Follow instructions to collect sample, you may call the company for any help or questions, 24/7 telephone support at [phone].    Your provider would like to you have your annual eye exam.  [facility]   Address: [address] [place], [place] [address] Phone: [phone]  Website: [email or web address] [address] [place], [place] [address] Phone: [phone] [email or web address]  [facility]  Address: [place], [place],  [address] Phone: [phone]   [facility] [address] [place], [place] [address] Phone: [phone]  [facility] Address: [place], [place],  [address]  Phone: [phone]    Sleep Hygiene Recommendations to promote healthy sleep in all patients, especially if symptoms of insomnia are worsening. Due to the nature of sleep rhythms, if your body gets "out of rhythm", it may take some time before your sleep cycle can be "reset".  Please try to follow as many of the following tips as you can, usually there are only a few of these are the primary cause of the problem.  ?To reset your sleep rhythm, go to bed and get up at the same time every day ?Sleep only long enough to feel rested and then get out of bed ?Do not try to force yourself to sleep. If you can't sleep, get out of bed and try again later. ?Avoid naps during the day, unless excessively tired. The more sleeping during the day, then the less sleep your body needs at night.  ?Have coffee, tea, and other foods that have caffeine only in the  morning ?Exercise several days a week, but not right before bed ?If you drink alcohol, prefer to have appropriate drink with one meal, but prefer to avoid alcohol in the evening, and bedtime ?If you smoke, avoid smoking, especially in the evening  ?Avoid watching TV or looking at phones, computers, or reading devices ("e-books") that give off light at least 30 minutes before bed. This artificial light sends "awake signals" to your brain and can make it harder to fall asleep. ?Make your bedroom a comfortable place where it is easy to fall asleep: ? Put up shades or special blackout curtains to block light from outside. ? Use a white noise machine to block noise. ? Keep the temperature cool. ?Try your best to solve or at least address your problems before you go to bed ?Use relaxation techniques to manage stress. Ask your health care provider to suggest some techniques that may work well for you. These may include: ? Breathing exercises. ? Routines to release muscle tension. ? Visualizing peaceful scenes.   Please schedule a Follow-up Appointment to: Return in about 3 months (around 10/29/2020) for 3 month AM apt w/ new provider - Yearly Medicare Checkup - bring all medicine bottles.  If you have any other questions or concerns, please feel free to call the office or send a message through [place]. You may also schedule an earlier appointment if necessary.  Additionally, you may be receiving a survey about your experience at our office  within a few days to 1 week by e-mail or mail. We value your feedback.  Nobie Putnam, DO [place]

## 2020-08-01 DIAGNOSIS — Z1212 Encounter for screening for malignant neoplasm of rectum: Secondary | ICD-10-CM | POA: Diagnosis not present

## 2020-08-01 DIAGNOSIS — Z1211 Encounter for screening for malignant neoplasm of colon: Secondary | ICD-10-CM | POA: Diagnosis not present

## 2020-08-01 LAB — COLOGUARD: Cologuard: POSITIVE — AB

## 2020-08-09 LAB — COLOGUARD: COLOGUARD: POSITIVE — AB

## 2020-08-15 ENCOUNTER — Telehealth: Payer: Self-pay | Admitting: Family Medicine

## 2020-08-15 DIAGNOSIS — R195 Other fecal abnormalities: Secondary | ICD-10-CM

## 2020-08-15 NOTE — Telephone Encounter (Signed)
Wally calling from [employer]. Is calling to see if the fax was received for an abnormal colloguard result for the patient. Case Number- [id number] [phone]

## 2020-08-18 NOTE — Telephone Encounter (Signed)
Cologuard abstracted in the patient chart the results are in care everywhere.

## 2020-08-18 NOTE — Telephone Encounter (Signed)
Please notify patient of the following:  We received his Cologuard result and it was POSITIVE.  Most of the time we get a positive Cologuard result, usually it is due to a Polyp that is either benign or possibly pre-cancer that is caught very early and is treatable before it ever turns into a problem or cancer.  The next step is a Colonoscopy procedure to look for the abnormality or polyp and remove and treat it.   I have submitted the referral already to :  [facility]) [address], [place] [address] Phone: [phone]  They will contact him with next steps in the plan for scheduling Colonoscopy.  Nobie Putnam, [facility] 08/18/2020, 4:53 PM

## 2020-08-18 NOTE — Telephone Encounter (Signed)
Can you check on status of this Cologuard?  I do not see the result in my inbox or on chart.  Will need to abstract and scan it to chart and notify patient - likely needs GI referral  Thanks  Nobie Putnam, [facility] 08/18/2020, 12:04 PM

## 2020-08-19 NOTE — Telephone Encounter (Signed)
Attempted to contact the patient, no answer. LMOM to return call.

## 2020-08-19 NOTE — Telephone Encounter (Signed)
The pt called back and was notified of positive colonoscopy results. He verbalize understanding. He was informed if he doesn't hear anything from the gastroenterologist by the end of the week to give [place] a call back so we can check on that referral. No questions or concerns.

## 2020-08-22 DIAGNOSIS — S83412A Sprain of medial collateral ligament of left knee, initial encounter: Secondary | ICD-10-CM | POA: Diagnosis not present

## 2020-08-22 DIAGNOSIS — S83411A Sprain of medial collateral ligament of right knee, initial encounter: Secondary | ICD-10-CM | POA: Diagnosis not present

## 2020-08-25 ENCOUNTER — Telehealth: Payer: Self-pay

## 2020-08-25 NOTE — Telephone Encounter (Signed)
Patient lvm returning call to schedule colonoscopy triage call.  Thanks,  [place], [place]

## 2020-09-02 ENCOUNTER — Telehealth (INDEPENDENT_AMBULATORY_CARE_PROVIDER_SITE_OTHER): Payer: Self-pay | Admitting: Gastroenterology

## 2020-09-02 ENCOUNTER — Other Ambulatory Visit: Payer: Self-pay

## 2020-09-02 ENCOUNTER — Telehealth: Payer: Self-pay

## 2020-09-02 DIAGNOSIS — R195 Other fecal abnormalities: Secondary | ICD-10-CM

## 2020-09-02 MED ORDER — NA SULFATE-K SULFATE-MG SULF 17.5-3.13-1.6 GM/177ML PO SOLN: 1.0000 | Freq: Once | ORAL | 0 refills | Status: AC

## 2020-09-02 NOTE — Progress Notes (Signed)
Gastroenterology Pre-Procedure Review  Request Date: Thursday 09/11/20 Requesting Physician: Dr. Vicente Males  PATIENT REVIEW QUESTIONS: The patient responded to the following health history questions as indicated:    1. Are you having any GI issues? no Positive Cologuard no GI symptoms 2. Do you have a personal history of Polyps? no 3. Do you have a family history of Colon Cancer or Polyps? no 4. Diabetes Mellitus? Chart problem list indicates Diet controlled type 2 daibetic 5. Joint replacements in the past 12 months?no 6. Major health problems in the past 3 months?no 7. Any artificial heart valves, MVP, or defibrillator?no    MEDICATIONS & ALLERGIES:    Patient reports the following regarding taking any anticoagulation/antiplatelet therapy:   Plavix, Coumadin, Eliquis, Xarelto, Lovenox, Pradaxa, Brilinta, or Effient? no Aspirin? no  Patient confirms/reports the following medications:  Current Outpatient Medications  Medication Sig Dispense Refill  . atorvastatin (LIPITOR) 40 MG tablet TAKE 1 TABLET (40 MG TOTAL) DAILY BY MOUTH. 90 tablet 4  . cyclobenzaprine (FLEXERIL) 10 MG tablet Take 1 tablet (10 mg total) by mouth 3 (three) times daily as needed for muscle spasms. 60 tablet 0  . ezetimibe (ZETIA) 10 MG tablet TAKE 1 TABLET BY MOUTH EVERY DAY 90 tablet 1  . gabapentin (NEURONTIN) 100 MG capsule Start 1 capsule daily, increase by 1 cap every 2-3 days as tolerated up to 3 times a day, or may take 3 at once in evening. 90 capsule 1  . lisinopril (ZESTRIL) 5 MG tablet Take 1 tablet (5 mg total) by mouth daily. 90 tablet 1  . meloxicam (MOBIC) 15 MG tablet Mobic 15 mg tablet  Take 1 tablet every day by oral route.    . methocarbamol (ROBAXIN) 500 MG tablet Take 500 mg by mouth 2 (two) times daily.    . naproxen (NAPROSYN) 500 MG tablet Take 1 tablet (500 mg total) by mouth 2 (two) times daily with a meal. For 2 weeks then as needed 60 tablet 0  . traMADol (ULTRAM) 50 MG tablet Take 50 mg by  mouth every 6 (six) hours as needed.     No current facility-administered medications for this visit.    Patient confirms/reports the following allergies:  Allergies  Allergen Reactions  . Other     BLOOD THINNER USED 20 YEARS AGO-PT UNSURE EXACTLY OF NAME-CAUSED FEVERS OF 104 PER PT    No orders of the defined types were placed in this encounter.   AUTHORIZATION INFORMATION Primary Insurance: 1D#: Group #:  Secondary Insurance: 1D#: Group #:  SCHEDULE INFORMATION: Date: 09/11/20 Time: Location:[facility]

## 2020-09-02 NOTE — Telephone Encounter (Signed)
Returned call back to Verneita Griffes to review colonoscopy instructions for Mr. Snider.  LVM for her to call me back to review.  Thanks,  [place], [place]

## 2020-09-11 ENCOUNTER — Encounter
Admission: RE | Disposition: A | Discharge status: Home / Self Care | Payer: Self-pay | Source: Home / Self Care | Attending: Gastroenterology

## 2020-09-11 ENCOUNTER — Ambulatory Visit: Payer: Medicare Other | Admitting: Anesthesiology

## 2020-09-11 ENCOUNTER — Ambulatory Visit
Admission: RE | Admit: 2020-09-11 | Discharge: 2020-09-11 | Disposition: A | Discharge status: Home / Self Care | Payer: Medicare Other | Attending: Gastroenterology | Admitting: Gastroenterology

## 2020-09-11 ENCOUNTER — Encounter: Payer: Self-pay | Admitting: Gastroenterology

## 2020-09-11 DIAGNOSIS — R195 Other fecal abnormalities: Secondary | ICD-10-CM | POA: Diagnosis not present

## 2020-09-11 DIAGNOSIS — D120 Benign neoplasm of cecum: Secondary | ICD-10-CM | POA: Insufficient documentation

## 2020-09-11 DIAGNOSIS — K635 Polyp of colon: Secondary | ICD-10-CM | POA: Diagnosis not present

## 2020-09-11 DIAGNOSIS — F17210 Nicotine dependence, cigarettes, uncomplicated: Secondary | ICD-10-CM | POA: Diagnosis not present

## 2020-09-11 DIAGNOSIS — K621 Rectal polyp: Secondary | ICD-10-CM | POA: Diagnosis not present

## 2020-09-11 DIAGNOSIS — D125 Benign neoplasm of sigmoid colon: Secondary | ICD-10-CM | POA: Diagnosis not present

## 2020-09-11 DIAGNOSIS — Z79899 Other long term (current) drug therapy: Secondary | ICD-10-CM | POA: Insufficient documentation

## 2020-09-11 DIAGNOSIS — K5790 Diverticulosis of intestine, part unspecified, without perforation or abscess without bleeding: Secondary | ICD-10-CM | POA: Diagnosis not present

## 2020-09-11 DIAGNOSIS — K640 First degree hemorrhoids: Secondary | ICD-10-CM | POA: Diagnosis not present

## 2020-09-11 DIAGNOSIS — Z791 Long term (current) use of non-steroidal anti-inflammatories (NSAID): Secondary | ICD-10-CM | POA: Insufficient documentation

## 2020-09-11 DIAGNOSIS — D128 Benign neoplasm of rectum: Secondary | ICD-10-CM | POA: Diagnosis not present

## 2020-09-11 HISTORY — PX: COLONOSCOPY WITH PROPOFOL: SHX5780

## 2020-09-11 SURGERY — COLONOSCOPY WITH PROPOFOL
Anesthesia: General

## 2020-09-11 MED ORDER — SODIUM CHLORIDE 0.9 % IV SOLN
INTRAVENOUS | Status: DC
  Administered 2020-09-11: 1000 mL via INTRAVENOUS

## 2020-09-11 MED ORDER — PROPOFOL 500 MG/50ML IV EMUL
INTRAVENOUS | Status: DC | PRN
  Administered 2020-09-11: 120 ug/kg/min via INTRAVENOUS

## 2020-09-11 MED ORDER — LIDOCAINE HCL (PF) 1 % IJ SOLN
INTRAMUSCULAR | Status: AC
  Filled 2020-09-11: qty 2

## 2020-09-11 MED ORDER — PROPOFOL 500 MG/50ML IV EMUL
INTRAVENOUS | Status: AC
  Filled 2020-09-11: qty 50

## 2020-09-11 MED ORDER — LIDOCAINE HCL (PF) 2 % IJ SOLN
INTRAMUSCULAR | Status: AC
  Filled 2020-09-11: qty 5

## 2020-09-11 NOTE — Op Note (Signed)
[facility] Patient Name: Craig Bruce Procedure Date: 09/11/2020 9:04 AM MRN: [id number] Account #: [id number] Date of Birth: 11-24-1955 Admit Type: Outpatient Age: 65 Room: [facility] ENDO ROOM 3 Gender: Male Note Status: Finalized Procedure:             Colonoscopy Indications:           Positive Cologuard test Providers:             Jonathon Bellows MD, MD Referring MD:          Lupita Raider. [place] (Referring MD) Medicines:             Monitored Anesthesia Care Complications:         No immediate complications. Procedure:             Pre-Anesthesia Assessment:                        - Prior to the procedure, a History and Physical was                         performed, and patient medications, allergies and                         sensitivities were reviewed. The patient's tolerance                         of previous anesthesia was reviewed.                        - The risks and benefits of the procedure and the                         sedation options and risks were discussed with the                         patient. All questions were answered and informed                         consent was obtained.                        - ASA Grade Assessment: II - A patient with mild                         systemic disease.                        After obtaining informed consent, the colonoscope was                         passed under direct vision. Throughout the procedure,                         the patient's blood pressure, pulse, and oxygen                         saturations were monitored continuously. The                         Colonoscope was introduced through the anus and  advanced to the the cecum, identified by the                         appendiceal orifice. The colonoscopy was performed                         with ease. The patient tolerated the procedure well.                         The quality of the bowel preparation  was adequate. Findings:      The perianal and digital rectal examinations were normal.      Two sessile polyps were found in the sigmoid colon and cecum. The polyps       were 5 to 6 mm in size. These polyps were removed with a cold snare.       Resection and retrieval were complete.      Six sessile polyps were found in the rectum. The polyps were 3 to 5 mm       in size. These polyps were removed with a cold snare. Resection and       retrieval were complete.      Non-bleeding internal hemorrhoids were found during retroflexion. The       hemorrhoids were medium-sized and Grade I (internal hemorrhoids that do       not prolapse).      The exam was otherwise without abnormality on direct and retroflexion       views. Impression:            - Two 5 to 6 mm polyps in the sigmoid colon and in the                         cecum, removed with a cold snare. Resected and                         retrieved.                        - Six 3 to 5 mm polyps in the rectum, removed with a                         cold snare. Resected and retrieved.                        - Non-bleeding internal hemorrhoids.                        - The examination was otherwise normal on direct and                         retroflexion views. Recommendation:        - Discharge patient to home (with escort).                        - Resume previous diet.                        - Continue present medications.                        - Await  pathology results.                        - Repeat colonoscopy for surveillance based on                         pathology results. Procedure Code(s):     --- Professional ---                        [phone], Colonoscopy, flexible; with removal of                         tumor(s), polyp(s), or other lesion(s) by snare                         technique Diagnosis Code(s):     --- Professional ---                        K63.5, Polyp of colon                        K62.1, Rectal polyp                         K64.0, First degree hemorrhoids                        R19.5, Other fecal abnormalities CPT copyright 2019 American Medical Association. All rights reserved. The codes documented in this report are preliminary and upon coder review may  be revised to meet current compliance requirements. Jonathon Bellows, MD Jonathon Bellows MD, MD 09/11/2020 9:40:01 AM This report has been signed electronically. Number of Addenda: 0 Note Initiated On: 09/11/2020 9:04 AM Scope Withdrawal Time: 0 hours 16 minutes 26 seconds  Total Procedure Duration: 0 hours 19 minutes 35 seconds  Estimated Blood Loss:  Estimated blood loss: none.      [facility]

## 2020-09-11 NOTE — Anesthesia Postprocedure Evaluation (Signed)
Anesthesia Post Note  Patient: Craig Bruce  Procedure(s) Performed: COLONOSCOPY WITH PROPOFOL (N/A )  Patient location during evaluation: Endoscopy Anesthesia Type: General Level of consciousness: awake and alert Pain management: pain level controlled Vital Signs Assessment: post-procedure vital signs reviewed and stable Respiratory status: spontaneous breathing and respiratory function stable Cardiovascular status: stable Anesthetic complications: no   No complications documented.   Last Vitals:  Vitals:   09/11/20 0959 09/11/20 1007  BP: 138/87 (!) 146/93  Pulse: 77 72  Resp: 11 15  Temp:    SpO2: 99% 100%    Last Pain:  Vitals:   09/11/20 1007  TempSrc:   PainSc: 0-No pain                 Sharisse Rantz K

## 2020-09-11 NOTE — H&P (Signed)
Jonathon Bellows, MD [address] [place], [place], [place], [address] [address], [place], [place], [place], [address] Phone: [phone]  Fax: [phone]  Primary Care Physician:  Verl Bangs, FNP   Pre-Procedure History & Physical: HPI:  Craig Bruce is a 65 y.o. male is here for an colonoscopy.   Past Medical History:  Diagnosis Date  . Annual physical exam 03/24/2015  . Arthritis    BOTH HANDS  . Chronic kidney disease    H/O KIDNEY STONES  . DVT (deep venous thrombosis) ([place]) 1996  . Gastritis   . Immunization due 03/24/2015  . Need for influenza vaccination 03/24/2015  . Obesity (BMI 30.0-34.9) 02/17/2015  . Smoker 02/17/2015    Past Surgical History:  Procedure Laterality Date  . ORCHIECTOMY Right 06/25/2015   Procedure: ORCHIECTOMY;  Surgeon: Nickie Retort, MD;  Location: [facility] ORS;  Service: Urology;  Laterality: Right;  . WISDOM TOOTH EXTRACTION      Prior to Admission medications   Medication Sig Start Date End Date Taking? Authorizing Provider  atorvastatin (LIPITOR) 40 MG tablet TAKE 1 TABLET (40 MG TOTAL) DAILY BY MOUTH. 06/27/18   Mikey College, NP  cyclobenzaprine (FLEXERIL) 10 MG tablet Take 1 tablet (10 mg total) by mouth 3 (three) times daily as needed for muscle spasms. 12/26/19   Karamalegos, Devonne Doughty, DO  ezetimibe (ZETIA) 10 MG tablet TAKE 1 TABLET BY MOUTH EVERY DAY 06/20/19   Karamalegos, Devonne Doughty, DO  gabapentin (NEURONTIN) 100 MG capsule Start 1 capsule daily, increase by 1 cap every 2-3 days as tolerated up to 3 times a day, or may take 3 at once in evening. 12/26/19   Karamalegos, Devonne Doughty, DO  lisinopril (ZESTRIL) 5 MG tablet Take 1 tablet (5 mg total) by mouth daily. 02/08/20   Malfi, Lupita Raider, FNP  meloxicam (MOBIC) 15 MG tablet Mobic 15 mg tablet  Take 1 tablet every day by oral route.    [provider]  methocarbamol (ROBAXIN) 500 MG tablet Take 500 mg by mouth 2 (two) times daily. 01/14/20   [provider]  naproxen (NAPROSYN) 500 MG tablet Take 1 tablet (500 mg total) by mouth 2 (two) times daily with a meal. For 2 weeks then as needed 12/26/19   Olin Hauser, DO  traMADol (ULTRAM) 50 MG tablet Take 50 mg by mouth every 6 (six) hours as needed. 08/22/20   [provider]    Allergies as of 09/02/2020 - Review Complete 09/02/2020  Allergen Reaction Noted  . Other  06/12/2015    Family History  Problem Relation Age of Onset  . Prostate cancer Neg Hx   . Kidney cancer Neg Hx   . Kidney failure Neg Hx     Social History   Socioeconomic History  . Marital status: Single    Spouse name: Not on file  . Number of children: Not on file  . Years of education: Not on file  . Highest education level: Not on file  Occupational History  . Not on file  Tobacco Use  . Smoking status: Current Every Day Smoker    Packs/day: 0.25    Years: 20.00    Pack years: 5.00    Types: Cigarettes  . Smokeless tobacco: Never Used  . Tobacco comment: Previously smoked 1ppd.  Cutting back and down from 5 cigs / day to 4 cigs per day in 4 weeks.  Vaping Use  . Vaping Use: Never  used  Substance and Sexual Activity  . Alcohol use: Yes    Alcohol/week: 2.0 standard drinks    Types: 2 Cans of beer per week    Comment: BEER OCC  . Drug use: No  . Sexual activity: Not Currently  Other Topics Concern  . Not on file  Social History Narrative  . Not on file   Social Determinants of Health   Financial Resource Strain: Not on file  Food Insecurity: Not on file  Transportation Needs: Not on file  Physical Activity: Not on file  Stress: Not on file  Social Connections: Not on file  Intimate Partner Violence: Not on file    Review of Systems: See HPI, otherwise negative ROS  Physical Exam: BP (!) 163/89   Pulse 84   Temp 97.7 F (36.5 C) (Temporal)   Resp 16   Ht 5\' 6"  (1.676 m)   Wt 95.3 kg   SpO2 100%   BMI 33.89 kg/m  General:   Alert,  pleasant and  cooperative in NAD Head:  Normocephalic and atraumatic. Neck:  Supple; no masses or thyromegaly. Lungs:  Clear throughout to auscultation, normal respiratory effort.    Heart:  +S1, +S2, Regular rate and rhythm, No edema. Abdomen:  Soft, nontender and nondistended. Normal bowel sounds, without guarding, and without rebound.   Neurologic:  Alert and  oriented x4;  grossly normal neurologically.  Impression/Plan: Craig Bruce is here for an colonoscopy to be performed for positive cologuard.  Risks, benefits, limitations, and alternatives regarding  colonoscopy have been reviewed with the patient.  Questions have been answered.  All parties agreeable.   Jonathon Bellows, MD  09/11/2020, 8:53 AM

## 2020-09-11 NOTE — Anesthesia Preprocedure Evaluation (Signed)
Anesthesia Evaluation  Patient identified by MRN, date of birth, ID band Patient awake    Reviewed: Allergy & Precautions, NPO status , Patient's Chart, lab work & pertinent test results  History of Anesthesia Complications Negative for: history of anesthetic complications  Airway Mallampati: II       Dental   Pulmonary neg sleep apnea, neg COPD, Current Smoker and Patient abstained from smoking.,           Cardiovascular (-) hypertension(-) Past MI and (-) CHF (-) dysrhythmias (-) Valvular Problems/Murmurs     Neuro/Psych neg Seizures    GI/Hepatic Neg liver ROS, neg GERD  ,  Endo/Other  diabetes (pt denies)  Renal/GU Renal InsufficiencyRenal disease     Musculoskeletal   Abdominal   Peds  Hematology   Anesthesia Other Findings   Reproductive/Obstetrics                             Anesthesia Physical Anesthesia Plan  ASA: II  Anesthesia Plan: General   Post-op Pain Management:    Induction: Intravenous  PONV Risk Score and Plan:   Airway Management Planned: Nasal Cannula  Additional Equipment:   Intra-op Plan:   Post-operative Plan:   Informed Consent: I have reviewed the patients History and Physical, chart, labs and discussed the procedure including the risks, benefits and alternatives for the proposed anesthesia with the patient or authorized representative who has indicated his/her understanding and acceptance.       Plan Discussed with:   Anesthesia Plan Comments:         Anesthesia Quick Evaluation

## 2020-09-11 NOTE — Anesthesia Procedure Notes (Signed)
Performed by: Vaughan Sine Pre-anesthesia Checklist: Patient identified, Emergency Drugs available, Suction available, Patient being monitored and Timeout performed Patient Re-evaluated:Patient Re-evaluated prior to induction Oxygen Delivery Method: Simple face mask Preoxygenation: Pre-oxygenation with 100% oxygen Induction Type: IV induction Placement Confirmation: CO2 detector and positive ETCO2

## 2020-09-11 NOTE — Transfer of Care (Signed)
Immediate Anesthesia Transfer of Care Note  Patient: Craig Bruce  Procedure(s) Performed: COLONOSCOPY WITH PROPOFOL (N/A )  Patient Location: PACU  Anesthesia Type:General  Level of Consciousness: awake and sedated  Airway & Oxygen Therapy: Patient Spontanous Breathing and Patient connected to nasal cannula oxygen  Post-op Assessment: Report given to RN and Post -op Vital signs reviewed and stable  Post vital signs: Reviewed and stable  Last Vitals:  Vitals Value Taken Time  BP    Temp    Pulse    Resp    SpO2      Last Pain:  Vitals:   09/11/20 0848  TempSrc: Temporal  PainSc: 0-No pain         Complications: No complications documented.

## 2020-09-12 ENCOUNTER — Encounter: Payer: Self-pay | Admitting: Gastroenterology

## 2020-09-12 LAB — SURGICAL PATHOLOGY

## 2020-09-16 ENCOUNTER — Encounter: Payer: Self-pay | Admitting: Gastroenterology

## 2020-09-24 ENCOUNTER — Other Ambulatory Visit: Payer: Self-pay

## 2020-09-24 ENCOUNTER — Ambulatory Visit (INDEPENDENT_AMBULATORY_CARE_PROVIDER_SITE_OTHER): Payer: Medicare Other | Admitting: Family Medicine

## 2020-09-24 ENCOUNTER — Encounter: Payer: Self-pay | Admitting: Family Medicine

## 2020-09-24 VITALS — BP 148/83 | HR 96 | Ht 66.0 in | Wt 217.0 lb

## 2020-09-24 DIAGNOSIS — R399 Unspecified symptoms and signs involving the genitourinary system: Secondary | ICD-10-CM

## 2020-09-24 DIAGNOSIS — R39198 Other difficulties with micturition: Secondary | ICD-10-CM | POA: Diagnosis not present

## 2020-09-24 LAB — POCT URINALYSIS DIPSTICK
Bilirubin, UA: NEGATIVE
Glucose, UA: POSITIVE — AB
Ketones, UA: NEGATIVE
Leukocytes, UA: NEGATIVE
Nitrite, UA: NEGATIVE
Protein, UA: NEGATIVE
Spec Grav, UA: 1.015 (ref 1.010–1.025)
Urobilinogen, UA: 0.2 E.U./dL
pH, UA: 6 (ref 5.0–8.0)

## 2020-09-24 MED ORDER — TAMSULOSIN HCL 0.4 MG PO CAPS: 0.4000 mg | ORAL_CAPSULE | Freq: Every day | ORAL | 2 refills | Status: DC

## 2020-09-24 NOTE — Patient Instructions (Addendum)
Thank you for coming to the office today.  Start Tamsulosin (flomax) 0.4mg  daily in morning with breakfast, caution with sudden standing. It should help improve urination, max dose is 2 pills.  May be due to higher sugar intake, alcohol can increase sugar and cause increased urination.  When urine flow slows down, the stopping and start is likely due to the enlarged prostate  Please schedule a Follow-up Appointment to: Return in about 3 months (around 12/25/2020) for cancel 6/8 yearly w/ me and schedule w/ Rollene Fare in 3 month follow-up BPH LUTS, Elevated A1c.  If you have any other questions or concerns, please feel free to call the office or send a message through [facility]. You may also schedule an earlier appointment if necessary.  Additionally, you may be receiving a survey about your experience at our office within a few days to 1 week by e-mail or mail. We value your feedback.  Nobie Putnam, DO [place]

## 2020-09-24 NOTE — Progress Notes (Signed)
Subjective:    Patient ID: Craig Bruce, male    DOB: 1956-01-22, 65 y.o.   MRN: [id number]  Craig Bruce is a 65 y.o. male presenting on 09/24/2020 for Urinary Retention  Patient presents for a same day appointment.  He has not returned for recent wellness / follow-up.  HPI   Urinary Retention / LUTS History of frequent beer and alcohol intake, and coffee intake, he admits urinating frequently with consuming these, otherwise he will have urinary stopping and starting LUTS, some weaker stream at times. Prior history of PSA lab normal range 2018 - He has elevated A1c 5.9 to 6.0 back in 2020 - Admits nocturia Denies any hematuria, dysuria, abdominal pain, flank pain   Depression screen [facility] 2/9 09/24/2020 12/26/2019 12/12/2018  Decreased Interest 0 0 0  Down, Depressed, Hopeless 0 0 0  PHQ - 2 Score 0 0 0  Altered sleeping 3 - -  Tired, decreased energy 3 - -  Change in appetite 0 - -  Feeling bad or failure about yourself  0 - -  Trouble concentrating 1 - -  Moving slowly or fidgety/restless 1 - -  Suicidal thoughts 0 - -  PHQ-9 Score 8 - -  Difficult doing work/chores Not difficult at all - -    Social History   Tobacco Use  . Smoking status: Current Every Day Smoker    Packs/day: 0.25    Years: 20.00    Pack years: 5.00    Types: Cigarettes  . Smokeless tobacco: Never Used  . Tobacco comment: Previously smoked 1ppd.  Cutting back and down from 5 cigs / day to 4 cigs per day in 4 weeks.  Vaping Use  . Vaping Use: Never used  Substance Use Topics  . Alcohol use: Yes    Alcohol/week: 2.0 standard drinks    Types: 2 Cans of beer per week    Comment: BEER OCC  . Drug use: No    Review of Systems Per HPI unless specifically indicated above     Objective:    BP (!) 148/83   Pulse 96   Ht 5\' 6"  (1.676 m)   Wt 217 lb (98.4 kg)   SpO2 99%   BMI 35.02 kg/m   Wt Readings from Last 3 Encounters:  09/24/20 217 lb (98.4 kg)  09/11/20 210 lb (95.3 kg)  07/29/20  215 lb 6.4 oz (97.7 kg)    Physical Exam Vitals and nursing note reviewed.  Constitutional:      General: He is not in acute distress.    Appearance: He is well-developed. He is not diaphoretic.     Comments: Well-appearing, comfortable, cooperative  HENT:     Head: Normocephalic and atraumatic.  Eyes:     General:        Right eye: No discharge.        Left eye: No discharge.     Conjunctiva/sclera: Conjunctivae normal.  Cardiovascular:     Rate and Rhythm: Normal rate.  Pulmonary:     Effort: Pulmonary effort is normal.  Skin:    General: Skin is warm and dry.     Findings: No erythema or rash.  Neurological:     Mental Status: He is alert and oriented to person, place, and time.  Psychiatric:        Behavior: Behavior normal.     Comments: Well groomed, good eye contact, normal speech and thoughts       Results for orders placed  or performed in visit on 09/24/20  POCT Urinalysis Dipstick  Result Value Ref Range   Color, UA Yellow    Clarity, UA Cloudy    Glucose, UA Positive (A) Negative   Bilirubin, UA Negative    Ketones, UA Negative    Spec Grav, UA 1.015 1.010 - 1.025   Blood, UA Trace    pH, UA 6.0 5.0 - 8.0   Protein, UA Negative Negative   Urobilinogen, UA 0.2 0.2 or 1.0 E.U./dL   Nitrite, UA Negative    Leukocytes, UA Negative Negative   Appearance     Odor        Assessment & Plan:   Problem List Items Addressed This Visit   None   Visit Diagnoses    Lower urinary tract symptoms (LUTS)    -  Primary   Relevant Medications   tamsulosin (FLOMAX) 0.4 MG CAPS capsule   Difficulty urinating       Relevant Medications   tamsulosin (FLOMAX) 0.4 MG CAPS capsule   Other Relevant Orders   POCT Urinalysis Dipstick (Completed)   Urine Culture      Clinically with likely BPH with LUTS some mixed urinary symptoms some weak stream and urinary retention Seems improved urination if drinking alcohol / coffee Discussed that likely multiple factors, can  have hyperglycemia with history of PreDM inc urine output also caffeine and alcohol can inc frequency He has variety of LUTS however suspected BPH involvement  UA Dipstick glucose, rbc  Will check urine culture to rule out infection  Will initiate therapy on Tamsulosin 0.4mg  daily for now, as trial to see if improve BPH LUTs, can increase dose as indicated. May warrant closer evaluation in future consider Urology if indicated, will likely need follow up on PreDM A1c management if hyperglycemia is causing polyuria.   Meds ordered this encounter  Medications  . tamsulosin (FLOMAX) 0.4 MG CAPS capsule    Sig: Take 1 capsule (0.4 mg total) by mouth daily after breakfast.    Dispense:  30 capsule    Refill:  2      Follow up plan: Return in about 3 months (around 12/25/2020) for cancel 6/8 yearly w/ me and schedule w/ Rollene Fare in 3 month follow-up BPH LUTS, Elevated A1c.   Nobie Putnam, [facility] 09/24/2020, 11:26 AM

## 2020-09-25 LAB — URINE CULTURE
MICRO NUMBER:: 11849910
Result:: NO GROWTH
SPECIMEN QUALITY:: ADEQUATE

## 2020-10-23 LAB — HM DIABETES EYE EXAM

## 2020-10-29 ENCOUNTER — Ambulatory Visit: Payer: Medicare Other | Admitting: Family Medicine

## 2020-11-18 ENCOUNTER — Ambulatory Visit (INDEPENDENT_AMBULATORY_CARE_PROVIDER_SITE_OTHER): Payer: Medicare Other | Admitting: Internal Medicine

## 2020-11-18 ENCOUNTER — Encounter: Payer: Self-pay | Admitting: Internal Medicine

## 2020-11-18 ENCOUNTER — Other Ambulatory Visit: Payer: Self-pay

## 2020-11-18 VITALS — BP 173/95 | HR 80 | Temp 97.7°F | Resp 18 | Ht 66.0 in | Wt 217.8 lb

## 2020-11-18 DIAGNOSIS — M545 Low back pain, unspecified: Secondary | ICD-10-CM

## 2020-11-18 DIAGNOSIS — Z6835 Body mass index (BMI) 35.0-35.9, adult: Secondary | ICD-10-CM | POA: Diagnosis not present

## 2020-11-18 DIAGNOSIS — E66811 Obesity, class 1: Secondary | ICD-10-CM | POA: Insufficient documentation

## 2020-11-18 DIAGNOSIS — E6609 Other obesity due to excess calories: Secondary | ICD-10-CM | POA: Insufficient documentation

## 2020-11-18 MED ORDER — KETOROLAC TROMETHAMINE 30 MG/ML IJ SOLN
30.0000 mg | Freq: Once | INTRAMUSCULAR | Status: AC
Start: 2020-11-18 — End: 2020-11-18
  Administered 2020-11-18: 30 mg via INTRAMUSCULAR

## 2020-11-18 MED ORDER — HYDROCODONE-ACETAMINOPHEN 5-325 MG PO TABS: 1.0000 | ORAL_TABLET | Freq: Four times a day (QID) | ORAL | 0 refills | Status: DC | PRN

## 2020-11-18 NOTE — Patient Instructions (Signed)
Back Exercises The following exercises strengthen the muscles that help to support the trunk and back. They also help to keep the lower back flexible. Doing these exercises can help to prevent back pain or lessen existing pain. If you have back pain or discomfort, try doing these exercises 2-3 times each day or as told by your health care provider. As your pain improves, do them once each day, but increase the number of times that you repeat the steps for each exercise (do more repetitions). To prevent the recurrence of back pain, continue to do these exercises once each day or as told by your health care provider. Do exercises exactly as told by your health care provider and adjust them as directed. It is normal to feel mild stretching, pulling, tightness, or discomfort as you do these exercises, but you should stop right away if youfeel sudden pain or your pain gets worse. Exercises Single knee to chest Repeat these steps 3-5 times for each leg: Lie on your back on a firm bed or the floor with your legs extended. Bring one knee to your chest. Your other leg should stay extended and in contact with the floor. Hold your knee in place by grabbing your knee or thigh with both hands and hold. Pull on your knee until you feel a gentle stretch in your lower back or buttocks. Hold the stretch for 10-30 seconds. Slowly release and straighten your leg. Pelvic tilt Repeat these steps 5-10 times: Lie on your back on a firm bed or the floor with your legs extended. Bend your knees so they are pointing toward the ceiling and your feet are flat on the floor. Tighten your lower abdominal muscles to press your lower back against the floor. This motion will tilt your pelvis so your tailbone points up toward the ceiling instead of pointing to your feet or the floor. With gentle tension and even breathing, hold this position for 5-10 seconds. Cat-cow Repeat these steps until your lower back becomes more  flexible: Get into a hands-and-knees position on a firm surface. Keep your hands under your shoulders, and keep your knees under your hips. You may place padding under your knees for comfort. Let your head hang down toward your chest. Contract your abdominal muscles and point your tailbone toward the floor so your lower back becomes rounded like the back of a cat. Hold this position for 5 seconds. Slowly lift your head, let your abdominal muscles relax and point your tailbone up toward the ceiling so your back forms a sagging arch like the back of a cow. Hold this position for 5 seconds.  Press-ups Repeat these steps 5-10 times: Lie on your abdomen (face-down) on the floor. Place your palms near your head, about shoulder-width apart. Keeping your back as relaxed as possible and keeping your hips on the floor, slowly straighten your arms to raise the top half of your body and lift your shoulders. Do not use your back muscles to raise your upper torso. You may adjust the placement of your hands to make yourself more comfortable. Hold this position for 5 seconds while you keep your back relaxed. Slowly return to lying flat on the floor.  Bridges Repeat these steps 10 times: Lie on your back on a firm surface. Bend your knees so they are pointing toward the ceiling and your feet are flat on the floor. Your arms should be flat at your sides, next to your body. Tighten your buttocks muscles and lift your   buttocks off the floor until your waist is at almost the same height as your knees. You should feel the muscles working in your buttocks and the back of your thighs. If you do not feel these muscles, slide your feet 1-2 inches farther away from your buttocks. Hold this position for 3-5 seconds. Slowly lower your hips to the starting position, and allow your buttocks muscles to relax completely. If this exercise is too easy, try doing it with your arms crossed over yourchest. Abdominal  crunches Repeat these steps 5-10 times: Lie on your back on a firm bed or the floor with your legs extended. Bend your knees so they are pointing toward the ceiling and your feet are flat on the floor. Cross your arms over your chest. Tip your chin slightly toward your chest without bending your neck. Tighten your abdominal muscles and slowly raise your trunk (torso) high enough to lift your shoulder blades a tiny bit off the floor. Avoid raising your torso higher than that because it can put too much stress on your low back and does not help to strengthen your abdominal muscles. Slowly return to your starting position. Back lifts Repeat these steps 5-10 times: Lie on your abdomen (face-down) with your arms at your sides, and rest your forehead on the floor. Tighten the muscles in your legs and your buttocks. Slowly lift your chest off the floor while you keep your hips pressed to the floor. Keep the back of your head in line with the curve in your back. Your eyes should be looking at the floor. Hold this position for 3-5 seconds. Slowly return to your starting position. Contact a health care provider if: Your back pain or discomfort gets much worse when you do an exercise. Your worsening back pain or discomfort does not lessen within 2 hours after you exercise. If you have any of these problems, stop doing these exercises right away. Do not do them again unless your health care provider says that you can. Get help right away if: You develop sudden, severe back pain. If this happens, stop doing the exercises right away. Do not do them again unless your health care provider says that you can. This information is not intended to replace advice given to you by your health care provider. Make sure you discuss any questions you have with your healthcare provider. Document Revised: 09/14/2018 Document Reviewed: 02/09/2018 Elsevier Patient Education  [place].

## 2020-11-18 NOTE — Progress Notes (Signed)
Subjective:    Patient ID: Craig Bruce, male    DOB: Apr 14, 1956, 65 y.o.   MRN: [id number]  HPI  Patient presents the clinic today today with c/o left sided low back pain. He reports this started 2 weeks ago after a fall. He tripped in his shop, caught himself with his left arm. He reports persistent unrelenting back pain since that time. He describes the pain as sharp, stabbing and burning. The pain does not radiate. He has not noticed any numbness or tingling in his left leg but he has had some weakness. He had a xray lumbar spine in 2019 which showed:   1.  No acute osseous abnormality. 2. Diffuse idiopathic skeletal hyperostosis. 3. Cholelithiasis.  He has not tried any heat or ice. He reports he is taking medications but have no idea what the names of them are. He reports the meds have not been effective at all.  Review of Systems     Past Medical History:  Diagnosis Date   Annual physical exam 03/24/2015   Arthritis    BOTH HANDS   Chronic kidney disease    H/O KIDNEY STONES   DVT (deep venous thrombosis) ([place]) 1996   Gastritis    Immunization due 03/24/2015   Need for influenza vaccination 03/24/2015   Obesity (BMI 30.0-34.9) 02/17/2015   Smoker 02/17/2015    Current Outpatient Medications  Medication Sig Dispense Refill   atorvastatin (LIPITOR) 40 MG tablet TAKE 1 TABLET (40 MG TOTAL) DAILY BY MOUTH. 90 tablet 4   cyclobenzaprine (FLEXERIL) 10 MG tablet Take 1 tablet (10 mg total) by mouth 3 (three) times daily as needed for muscle spasms. 60 tablet 0   ezetimibe (ZETIA) 10 MG tablet TAKE 1 TABLET BY MOUTH EVERY DAY 90 tablet 1   gabapentin (NEURONTIN) 100 MG capsule Start 1 capsule daily, increase by 1 cap every 2-3 days as tolerated up to 3 times a day, or may take 3 at once in evening. 90 capsule 1   lisinopril (ZESTRIL) 5 MG tablet Take 1 tablet (5 mg total) by mouth daily. 90 tablet 1   meloxicam (MOBIC) 15 MG tablet Mobic 15 mg tablet  Take 1 tablet every  day by oral route.     methocarbamol (ROBAXIN) 500 MG tablet Take 500 mg by mouth 2 (two) times daily.     naproxen (NAPROSYN) 500 MG tablet Take 1 tablet (500 mg total) by mouth 2 (two) times daily with a meal. For 2 weeks then as needed 60 tablet 0   tamsulosin (FLOMAX) 0.4 MG CAPS capsule Take 1 capsule (0.4 mg total) by mouth daily after breakfast. 30 capsule 2   traMADol (ULTRAM) 50 MG tablet Take 50 mg by mouth every 6 (six) hours as needed.     No current facility-administered medications for this visit.    Allergies  Allergen Reactions   Other     BLOOD THINNER USED 20 YEARS AGO-PT UNSURE EXACTLY OF NAME-CAUSED FEVERS OF 104 PER PT    Family History  Problem Relation Age of Onset   Prostate cancer Neg Hx    Kidney cancer Neg Hx    Kidney failure Neg Hx     Social History   Socioeconomic History   Marital status: Single    Spouse name: Not on file   Number of children: Not on file   Years of education: Not on file   Highest education level: Not on file  Occupational History   Not on  file  Tobacco Use   Smoking status: Every Day    Packs/day: 0.25    Years: 20.00    Pack years: 5.00    Types: Cigarettes   Smokeless tobacco: Never   Tobacco comments:    Previously smoked 1ppd.  Cutting back and down from 5 cigs / day to 4 cigs per day in 4 weeks.  Vaping Use   Vaping Use: Never used  Substance and Sexual Activity   Alcohol use: Yes    Alcohol/week: 2.0 standard drinks    Types: 2 Cans of beer per week    Comment: BEER OCC   Drug use: No   Sexual activity: Not Currently  Other Topics Concern   Not on file  Social History Narrative   Not on file   Social Determinants of Health   Financial Resource Strain: Not on file  Food Insecurity: Not on file  Transportation Needs: Not on file  Physical Activity: Not on file  Stress: Not on file  Social Connections: Not on file  Intimate Partner Violence: Not on file     Constitutional: Denies fever, malaise,  fatigue, headache or abrupt weight changes.  Respiratory: Denies difficulty breathing, shortness of breath, cough or sputum production.   Cardiovascular: Denies chest pain, chest tightness, palpitations or swelling in the hands or feet.  Gastrointestinal: Denies abdominal pain, bloating, constipation, diarrhea or blood in the stool.  GU: Denies urgency, frequency, pain with urination, burning sensation, blood in urine, odor or discharge. Musculoskeletal: Patient reports left side low back pain.  Denies decrease in range of motion, difficulty with gait, muscle pain or joint swelling.  Skin: Denies redness, rashes, lesions or ulcercations.  Neurological:  Denies numbness, tingling, or problems with balance and coordination.    No other specific complaints in a complete review of systems (except as listed in HPI above).  Objective:   Physical Exam BP (!) 173/95 (BP Location: Right Arm, Patient Position: Sitting, Cuff Size: Normal)   Pulse 80   Temp 97.7 F (36.5 C) (Temporal)   Resp 18   Ht 5\' 6"  (1.676 m)   Wt 217 lb 12.8 oz (98.8 kg)   SpO2 99%   BMI 35.15 kg/m   Wt Readings from Last 3 Encounters:  09/24/20 217 lb (98.4 kg)  09/11/20 210 lb (95.3 kg)  07/29/20 215 lb 6.4 oz (97.7 kg)    General: Appears his stated age, obese, in NAD. Skin: Warm, dry and intact. No rashes noted. HEENT: Head: normal shape and size;  Cardiovascular: Normal rate and rhythm. Pulmonary/Chest: Normal effort and positive vesicular breath sounds. No respiratory distress. No wheezes, rales or ronchi noted.  Abdomen: No CVA tenderness noted. Musculoskeletal: Normal flexion, rotation and lateral bending to the right. Decreased extension and lateral bending to the left. Pain with palpation over the lumbar spine and left paralumbar muscles. Strength 4/5 LLE, strength 5/5 RLE. Obvious difficulty going from a sitting to a standing position. Gait slow and steady without device. Neurological: Alert and oriented.  Negative SLR on the left.   BMET    Component Value Date/Time   NA 141 12/09/2018 0500   NA 140 03/25/2015 0802   NA 137 05/19/2013 0254   K 3.6 12/09/2018 0500   K 3.9 05/19/2013 0254   CL 108 12/09/2018 0500   CL 107 05/19/2013 0254   CO2 24 12/09/2018 0500   CO2 25 05/19/2013 0254   GLUCOSE 120 (H) 12/09/2018 0500   GLUCOSE 124 (H) 05/19/2013 0254  BUN 11 12/09/2018 0500   BUN 15 03/25/2015 0802   BUN 14 05/19/2013 0254   CREATININE 0.72 12/09/2018 0500   CREATININE 0.80 11/03/2017 0821   CALCIUM 8.8 (L) 12/09/2018 0500   CALCIUM 8.5 05/19/2013 0254   GFRNONAA >60 12/09/2018 0500   GFRNONAA 96 11/03/2017 0821   GFRAA >60 12/09/2018 0500   GFRAA 111 11/03/2017 0821    Lipid Panel     Component Value Date/Time   CHOL 195 11/03/2017 0821   CHOL 243 (H) 03/25/2015 0802   TRIG 250 (H) 11/03/2017 0821   HDL 43 11/03/2017 0821   HDL 38 (L) 03/25/2015 0802   CHOLHDL 4.5 11/03/2017 0821   VLDL 48 (H) 10/01/2016 1140   LDLCALC 116 (H) 11/03/2017 0821    CBC    Component Value Date/Time   WBC 7.6 12/09/2018 0500   RBC 5.13 12/09/2018 0500   HGB 16.4 12/09/2018 0500   HGB 16.0 03/25/2015 0802   HCT 45.5 12/09/2018 0500   HCT 45.1 03/25/2015 0802   PLT 210 12/09/2018 0500   PLT 190 03/25/2015 0802   MCV 88.7 12/09/2018 0500   MCV 90 03/25/2015 0802   MCV 89 05/19/2013 0254   MCH 32.0 12/09/2018 0500   MCHC 36.0 12/09/2018 0500   RDW 12.1 12/09/2018 0500   RDW 13.4 03/25/2015 0802   RDW 13.2 05/19/2013 0254   LYMPHSABS 2.2 12/09/2018 0500   LYMPHSABS 1.6 03/25/2015 0802   LYMPHSABS 1.4 05/19/2013 0254   MONOABS 0.8 12/09/2018 0500   MONOABS 1.2 (H) 05/19/2013 0254   EOSABS 0.1 12/09/2018 0500   EOSABS 0.2 03/25/2015 0802   EOSABS 0.1 05/19/2013 0254   BASOSABS 0.0 12/09/2018 0500   BASOSABS 0.0 03/25/2015 0802   BASOSABS 0.1 05/19/2013 0254    Hgb A1C Lab Results  Component Value Date   HGBA1C 6.0 (H) 10/25/2018            Assessment &  Plan:  Acute Left Sided Low Back Pain:  No indication for xray at this time Toradol 30 mg IM x 1 RX for Hydrocodone-Acetaminophen 5-325 mg TID prn Encouraged stretching, back exercises given Encouraged heat Advised him to call me when he got home to review his meds  Return precautions discussed  Webb Silversmith, NP This visit occurred during the SARS-CoV-2 public health emergency.  Safety protocols were in place, including screening questions prior to the visit, additional usage of staff PPE, and extensive cleaning of exam room while observing appropriate contact time as indicated for disinfecting solutions.

## 2020-11-18 NOTE — Assessment & Plan Note (Signed)
Encouraged diet and exercise for weight loss ?

## 2020-12-01 ENCOUNTER — Telehealth: Payer: Self-pay

## 2020-12-01 ENCOUNTER — Other Ambulatory Visit: Payer: Self-pay | Admitting: Internal Medicine

## 2020-12-01 NOTE — Telephone Encounter (Signed)
Requested medication (s) are due for refill today: Yes  Requested medication (s) are on the active medication list: Yes  Last refill:  11/18/20  Future visit scheduled: Yes  Notes to clinic:  Unable to refill per protocol, cannot delegate.      Requested Prescriptions  Pending Prescriptions Disp Refills   HYDROcodone-acetaminophen (NORCO/VICODIN) 5-325 MG tablet 30 tablet 0    Sig: Take 1 tablet by mouth every 6 (six) hours as needed for moderate pain.      Not Delegated - Analgesics:  Opioid Agonist Combinations Failed - 12/01/2020  8:01 AM      Failed - This refill cannot be delegated      Failed - Urine Drug Screen completed in last 360 days      Passed - Valid encounter within last 6 months    Recent Outpatient Visits           1 week ago Acute left-sided low back pain without sciatica   [facility] [place], Coralie Keens, NP   2 months ago Lower urinary tract symptoms (LUTS)   [place], DO   4 months ago Diet-controlled type 2 diabetes mellitus [facility])   [place], DO   9 months ago Diet-controlled type 2 diabetes mellitus [facility])   [place], FNP   11 months ago Acute pain of left shoulder   [facility] Olin Hauser, DO       Future Appointments             In 3 weeks Ba[place], Coralie Keens, NP [facility], [facility]

## 2020-12-01 NOTE — Telephone Encounter (Signed)
This is not going to be a long term medication. If he is not improved, he needs to follow up.

## 2020-12-01 NOTE — Telephone Encounter (Signed)
Copied from [place] [id number]. Topic: Quick Communication - Rx Refill/Question >> Dec 01, 2020  7:30 AM Leward Quan A wrote: Medication: HYDROcodone-acetaminophen (NORCO/VICODIN) 5-325 MG tablet   Has the patient contacted their pharmacy? No. (Agent: If no, request that the patient contact the pharmacy for the refill.) (Agent: If yes, when and what did the pharmacy advise?)  Preferred Pharmacy (with phone number or street name): [id number] - [place], [address]  Phone:  [phone] Fax:  [phone]     Agent: Please be advised that RX refills may take up to 3 business days. We ask that you follow-up with your pharmacy.

## 2020-12-01 NOTE — Telephone Encounter (Signed)
Copied from [place] [id number]. Topic: Quick Communication - Rx Refill/Question >> Dec 01, 2020  7:30 AM Leward Quan A wrote: Medication: HYDROcodone-acetaminophen (NORCO/VICODIN) 5-325 MG tablet   Has the patient contacted their pharmacy? No. (Agent: If no, request that the patient contact the pharmacy for the refill.) (Agent: If yes, when and what did the pharmacy advise?)  Preferred Pharmacy (with phone number or street name): [id number] - [place], [address]  Phone:  [phone] Fax:  [phone]     Agent: Please be advised that RX refills may take up to 3 business days. We ask that you follow-up with your pharmacy. >> Dec 01, 2020  2:14 PM [place], Brayton Layman A wrote: Pt was calling in to follow up his refill request for HYDROcodone-acetaminophen (NORCO/VICODIN) 5-325 MG tablet

## 2020-12-01 NOTE — Telephone Encounter (Signed)
[facility] called and spoke to [place], [facility] about the refill requested. She says they received and filled on 11/18/20, the patient picked up on 11/18/20.

## 2020-12-02 NOTE — Telephone Encounter (Signed)
I called the patient and he is still having left side lower back pain. He denies pain when he is lying or resting the back, but as soon as he gets to moving the pain returns. Follow up appt scheduled for tomorrow  Wednesday, July 13th.

## 2020-12-02 NOTE — Telephone Encounter (Signed)
Pt called to check on status of refill request / pt stated he is getting a little better but still has pain on left side when getting out of bed/ please advise

## 2020-12-03 ENCOUNTER — Ambulatory Visit: Payer: Self-pay | Admitting: Internal Medicine

## 2020-12-06 ENCOUNTER — Other Ambulatory Visit: Payer: Self-pay | Admitting: Family Medicine

## 2020-12-06 DIAGNOSIS — R39198 Other difficulties with micturition: Secondary | ICD-10-CM

## 2020-12-06 DIAGNOSIS — R399 Unspecified symptoms and signs involving the genitourinary system: Secondary | ICD-10-CM

## 2020-12-06 NOTE — Telephone Encounter (Signed)
Requested Prescriptions  Pending Prescriptions Disp Refills  . tamsulosin (FLOMAX) 0.4 MG CAPS capsule [Pharmacy Med Name: TAMSULOSIN HCL 0.4 MG CAPSULE] 90 capsule 3    Sig: TAKE 1 CAPSULE (0.4 MG TOTAL) BY MOUTH DAILY AFTER BREAKFAST.     Urology: Alpha-Adrenergic Blocker Failed - 12/06/2020  3:19 PM      Failed - Last BP in normal range    BP Readings from Last 1 Encounters:  11/18/20 (!) 173/95         Passed - Valid encounter within last 12 months    Recent Outpatient Visits          2 weeks ago Acute left-sided low back pain without sciatica   [facility], Coralie Keens, NP   2 months ago Lower urinary tract symptoms (LUTS)   [place], DO   4 months ago Diet-controlled type 2 diabetes mellitus [facility])   [place], DO   10 months ago Diet-controlled type 2 diabetes mellitus [facility])   [place], FNP   11 months ago Acute pain of left shoulder   [facility] Olin Hauser, DO      Future Appointments            In 2 weeks Garnette Gunner, Coralie Keens, NP [facility], [facility]

## 2020-12-06 NOTE — Telephone Encounter (Signed)
Last RF 09/24/20 #30 2 RF

## 2020-12-25 ENCOUNTER — Encounter: Payer: Self-pay | Admitting: Internal Medicine

## 2020-12-25 ENCOUNTER — Other Ambulatory Visit: Payer: Self-pay

## 2020-12-25 ENCOUNTER — Ambulatory Visit (INDEPENDENT_AMBULATORY_CARE_PROVIDER_SITE_OTHER): Payer: Medicare Other | Admitting: Internal Medicine

## 2020-12-25 VITALS — BP 134/76 | HR 93 | Temp 98.6°F | Resp 17 | Ht 66.0 in | Wt 219.0 lb

## 2020-12-25 DIAGNOSIS — I10 Essential (primary) hypertension: Secondary | ICD-10-CM

## 2020-12-25 DIAGNOSIS — N400 Enlarged prostate without lower urinary tract symptoms: Secondary | ICD-10-CM

## 2020-12-25 DIAGNOSIS — I700 Atherosclerosis of aorta: Secondary | ICD-10-CM | POA: Diagnosis not present

## 2020-12-25 DIAGNOSIS — M1732 Unilateral post-traumatic osteoarthritis, left knee: Secondary | ICD-10-CM

## 2020-12-25 DIAGNOSIS — G4709 Other insomnia: Secondary | ICD-10-CM | POA: Diagnosis not present

## 2020-12-25 DIAGNOSIS — E1165 Type 2 diabetes mellitus with hyperglycemia: Secondary | ICD-10-CM

## 2020-12-25 DIAGNOSIS — E119 Type 2 diabetes mellitus without complications: Secondary | ICD-10-CM

## 2020-12-25 DIAGNOSIS — M8949 Other hypertrophic osteoarthropathy, multiple sites: Secondary | ICD-10-CM

## 2020-12-25 DIAGNOSIS — M150 Primary generalized (osteo)arthritis: Secondary | ICD-10-CM

## 2020-12-25 DIAGNOSIS — E782 Mixed hyperlipidemia: Secondary | ICD-10-CM | POA: Diagnosis not present

## 2020-12-25 DIAGNOSIS — M159 Polyosteoarthritis, unspecified: Secondary | ICD-10-CM

## 2020-12-25 DIAGNOSIS — Z6835 Body mass index (BMI) 35.0-35.9, adult: Secondary | ICD-10-CM

## 2020-12-25 DIAGNOSIS — E66812 Obesity, class 2: Secondary | ICD-10-CM

## 2020-12-25 MED ORDER — TRAZODONE HCL 50 MG PO TABS: 50.0000 mg | ORAL_TABLET | Freq: Every evening | ORAL | 2 refills | Status: DC | PRN

## 2020-12-25 NOTE — Progress Notes (Signed)
Subjective:    Patient ID: Craig Bruce, male    DOB: 12/04/1955, 65 y.o.   MRN: [id number]  HPI  Pt presents to the clinic today for follow up of chronic conditions. He is establishing care with me today, transferring care from Cyndia Skeeters, NP.  DM 2: His last A1C was 6%, 10/2018. He is not taking any oral diabetic medication at this time. He is on Lisinopril for renal protection. He does not check his sugars. He checks his feet routinely. His last eye exam was 1 year ago, [place]. Flu 12/2018. Pneumovax never. Prevnar never. Covid Pfizer x 2.  OA: Mainly in his left knee. He is now having some pain in his shoulders. He takes Meloxicam and Icy Hot  as needed with good relief of symptoms.   Insomnia: He has difficulty staying asleep. He has taken OTC medication in the past but reports it is not effective. There is no sleep study on file.   HLD with Aortic Atherosclerosis: His last LDL was 116, triglycerides 250, 10/2017. He denies myalgias on Atorvastatin and Zetia. He does not consume a low fat diet.  BPH: He denies any issues at this time. He is taking Flomax as prescribed. He does not follow with urology.  HTN: His BP today is 148/82. He is taking Lisinopril as prescribed.  ECG from 11/2018 reviewed.  Review of Systems     Past Medical History:  Diagnosis Date   Annual physical exam 03/24/2015   Arthritis    BOTH HANDS   Chronic kidney disease    H/O KIDNEY STONES   DVT (deep venous thrombosis) ([place]) 1996   Gastritis    Immunization due 03/24/2015   Need for influenza vaccination 03/24/2015   Obesity (BMI 30.0-34.9) 02/17/2015   Smoker 02/17/2015    Current Outpatient Medications  Medication Sig Dispense Refill   atorvastatin (LIPITOR) 40 MG tablet TAKE 1 TABLET (40 MG TOTAL) DAILY BY MOUTH. 90 tablet 4   cyclobenzaprine (FLEXERIL) 10 MG tablet Take 1 tablet (10 mg total) by mouth 3 (three) times daily as needed for muscle spasms. 60 tablet 0   ezetimibe (ZETIA) 10 MG  tablet TAKE 1 TABLET BY MOUTH EVERY DAY 90 tablet 1   gabapentin (NEURONTIN) 100 MG capsule Start 1 capsule daily, increase by 1 cap every 2-3 days as tolerated up to 3 times a day, or may take 3 at once in evening. 90 capsule 1   HYDROcodone-acetaminophen (NORCO/VICODIN) 5-325 MG tablet Take 1 tablet by mouth every 6 (six) hours as needed for moderate pain. 30 tablet 0   lisinopril (ZESTRIL) 5 MG tablet Take 1 tablet (5 mg total) by mouth daily. 90 tablet 1   meloxicam (MOBIC) 15 MG tablet Mobic 15 mg tablet  Take 1 tablet every day by oral route. (Patient not taking: Reported on 11/18/2020)     methocarbamol (ROBAXIN) 500 MG tablet Take 500 mg by mouth 2 (two) times daily.     naproxen (NAPROSYN) 500 MG tablet Take 1 tablet (500 mg total) by mouth 2 (two) times daily with a meal. For 2 weeks then as needed (Patient not taking: Reported on 11/18/2020) 60 tablet 0   tamsulosin (FLOMAX) 0.4 MG CAPS capsule TAKE 1 CAPSULE (0.4 MG TOTAL) BY MOUTH DAILY AFTER BREAKFAST. 90 capsule 3   traMADol (ULTRAM) 50 MG tablet Take 50 mg by mouth every 6 (six) hours as needed.     No current facility-administered medications for this visit.  Allergies  Allergen Reactions   Other     BLOOD THINNER USED 20 YEARS AGO-PT UNSURE EXACTLY OF NAME-CAUSED FEVERS OF 104 PER PT    Family History  Problem Relation Age of Onset   Prostate cancer Neg Hx    Kidney cancer Neg Hx    Kidney failure Neg Hx     Social History   Socioeconomic History   Marital status: Single    Spouse name: Not on file   Number of children: Not on file   Years of education: Not on file   Highest education level: Not on file  Occupational History   Not on file  Tobacco Use   Smoking status: Every Day    Packs/day: 0.25    Years: 20.00    Pack years: 5.00    Types: Cigarettes   Smokeless tobacco: Never   Tobacco comments:    Previously smoked 1ppd.  Cutting back and down from 5 cigs / day to 4 cigs per day in 4 weeks.  Vaping  Use   Vaping Use: Never used  Substance and Sexual Activity   Alcohol use: Yes    Alcohol/week: 2.0 standard drinks    Types: 2 Cans of beer per week    Comment: BEER OCC   Drug use: No   Sexual activity: Not Currently  Other Topics Concern   Not on file  Social History Narrative   Not on file   Social Determinants of Health   Financial Resource Strain: Not on file  Food Insecurity: Not on file  Transportation Needs: Not on file  Physical Activity: Not on file  Stress: Not on file  Social Connections: Not on file  Intimate Partner Violence: Not on file     Constitutional: Denies fever, malaise, fatigue, headache or abrupt weight changes.  HEENT: Denies eye pain, eye redness, ear pain, ringing in the ears, wax buildup, runny nose, nasal congestion, bloody nose, or sore throat. Respiratory: Denies difficulty breathing, shortness of breath, cough or sputum production.   Cardiovascular: Denies chest pain, chest tightness, palpitations or swelling in the hands or feet.  Gastrointestinal: Denies abdominal pain, bloating, constipation, diarrhea or blood in the stool.  GU: Denies urgency, frequency, pain with urination, burning sensation, blood in urine, odor or discharge. Musculoskeletal: Pt reports left knee pain, right shoulder pain. Denies decrease in range of motion, difficulty with gait, muscle pain or joint swelling.  Skin: Denies redness, rashes, lesions or ulcercations.  Neurological: Pt reports insomnia. Denies dizziness, difficulty with memory, difficulty with speech or problems with balance and coordination.  Psych: Denies anxiety, depression, SI/HI.  No other specific complaints in a complete review of systems (except as listed in HPI above).  Objective:   Physical Exam BP 134/76 (BP Location: Left Arm, Patient Position: Sitting)   Pulse 93   Temp 98.6 F (37 C) (Temporal)   Resp 17   Ht '5\' 6"'$  (1.676 m)   Wt 219 lb (99.3 kg)   SpO2 98%   BMI 35.35 kg/m   Wt  Readings from Last 3 Encounters:  11/18/20 217 lb 12.8 oz (98.8 kg)  09/24/20 217 lb (98.4 kg)  09/11/20 210 lb (95.3 kg)    General: Appears his stated age, obese, in NAD. Skin: Warm, dry and intact. No ulcerations noted. HEENT: Head: normal shape and size; Eyes: sclera white and EOMs intact;   Cardiovascular: Normal rate and rhythm. S1,S2 noted.  No murmur, rubs or gallops noted.  Trace BLE edema. No carotid  bruits noted. Pulmonary/Chest: Normal effort and positive vesicular breath sounds. No respiratory distress. No wheezes, rales or ronchi noted.  Musculoskeletal: Normal internal and external rotation of the right shoulder.  Pain with palpation of the right anterior proximal biceps tendon.  Negative drop can test on the right.  Strength 5/5 BUE/BLE.  No difficulty with gait.  Neurological: Alert and oriented.  Psychiatric: Mood and affect normal. Behavior is normal. Judgment and thought content normal.    BMET    Component Value Date/Time   NA 141 12/09/2018 0500   NA 140 03/25/2015 0802   NA 137 05/19/2013 0254   K 3.6 12/09/2018 0500   K 3.9 05/19/2013 0254   CL 108 12/09/2018 0500   CL 107 05/19/2013 0254   CO2 24 12/09/2018 0500   CO2 25 05/19/2013 0254   GLUCOSE 120 (H) 12/09/2018 0500   GLUCOSE 124 (H) 05/19/2013 0254   BUN 11 12/09/2018 0500   BUN 15 03/25/2015 0802   BUN 14 05/19/2013 0254   CREATININE 0.72 12/09/2018 0500   CREATININE 0.80 11/03/2017 0821   CALCIUM 8.8 (L) 12/09/2018 0500   CALCIUM 8.5 05/19/2013 0254   GFRNONAA >60 12/09/2018 0500   GFRNONAA 96 11/03/2017 0821   GFRAA >60 12/09/2018 0500   GFRAA 111 11/03/2017 0821    Lipid Panel     Component Value Date/Time   CHOL 195 11/03/2017 0821   CHOL 243 (H) 03/25/2015 0802   TRIG 250 (H) 11/03/2017 0821   HDL 43 11/03/2017 0821   HDL 38 (L) 03/25/2015 0802   CHOLHDL 4.5 11/03/2017 0821   VLDL 48 (H) 10/01/2016 1140   LDLCALC 116 (H) 11/03/2017 0821    CBC    Component Value Date/Time    WBC 7.6 12/09/2018 0500   RBC 5.13 12/09/2018 0500   HGB 16.4 12/09/2018 0500   HGB 16.0 03/25/2015 0802   HCT 45.5 12/09/2018 0500   HCT 45.1 03/25/2015 0802   PLT 210 12/09/2018 0500   PLT 190 03/25/2015 0802   MCV 88.7 12/09/2018 0500   MCV 90 03/25/2015 0802   MCV 89 05/19/2013 0254   MCH 32.0 12/09/2018 0500   MCHC 36.0 12/09/2018 0500   RDW 12.1 12/09/2018 0500   RDW 13.4 03/25/2015 0802   RDW 13.2 05/19/2013 0254   LYMPHSABS 2.2 12/09/2018 0500   LYMPHSABS 1.6 03/25/2015 0802   LYMPHSABS 1.4 05/19/2013 0254   MONOABS 0.8 12/09/2018 0500   MONOABS 1.2 (H) 05/19/2013 0254   EOSABS 0.1 12/09/2018 0500   EOSABS 0.2 03/25/2015 0802   EOSABS 0.1 05/19/2013 0254   BASOSABS 0.0 12/09/2018 0500   BASOSABS 0.0 03/25/2015 0802   BASOSABS 0.1 05/19/2013 0254    Hgb A1C Lab Results  Component Value Date   HGBA1C 6.0 (H) 10/25/2018            Assessment & Plan:   Webb Silversmith, NP This visit occurred during the SARS-CoV-2 public health emergency.  Safety protocols were in place, including screening questions prior to the visit, additional usage of staff PPE, and extensive cleaning of exam room while observing appropriate contact time as indicated for disinfecting solutions.

## 2020-12-25 NOTE — Patient Instructions (Signed)
Heart-Healthy Eating Plan Heart-healthy meal planning includes: Eating less unhealthy fats. Eating more healthy fats. Making other changes in your diet. Talk with your doctor or a diet specialist (dietitian) to create an eating plan that is right for you. What is my plan? Your doctor may recommend an eating plan that includes: Total fat: ______% or less of total calories a day. Saturated fat: ______% or less of total calories a day. Cholesterol: less than _________mg a day. What are tips for following this plan? Cooking Avoid frying your food. Try to bake, boil, grill, or broil it instead. You can also reduce fat by: Removing the skin from poultry. Removing all visible fats from meats. Steaming vegetables in water or broth. Meal planning  At meals, divide your plate into four equal parts: Fill one-half of your plate with vegetables and green salads. Fill one-fourth of your plate with whole grains. Fill one-fourth of your plate with lean protein foods. Eat 4-5 servings of vegetables per day. A serving of vegetables is: 1 cup of raw or cooked vegetables. 2 cups of raw leafy greens. Eat 4-5 servings of fruit per day. A serving of fruit is: 1 medium whole fruit.  cup of dried fruit.  cup of fresh, frozen, or canned fruit.  cup of 100% fruit juice. Eat more foods that have soluble fiber. These are apples, broccoli, carrots, beans, peas, and barley. Try to get 20-30 g of fiber per day. Eat 4-5 servings of nuts, legumes, and seeds per week: 1 serving of dried beans or legumes equals  cup after being cooked. 1 serving of nuts is  cup. 1 serving of seeds equals 1 tablespoon.  General information Eat more home-cooked food. Eat less restaurant, buffet, and fast food. Limit or avoid alcohol. Limit foods that are high in starch and sugar. Avoid fried foods. Lose weight if you are overweight. Keep track of how much salt (sodium) you eat. This is important if you have high blood  pressure. Ask your doctor to tell you more about this. Try to add vegetarian meals each week. Fats Choose healthy fats. These include olive oil and canola oil, flaxseeds, walnuts, almonds, and seeds. Eat more omega-3 fats. These include salmon, mackerel, sardines, tuna, flaxseed oil, and ground flaxseeds. Try to eat fish at least 2 times each week. Check food labels. Avoid foods with trans fats or high amounts of saturated fat. Limit saturated fats. These are often found in animal products, such as meats, butter, and cream. These are also found in plant foods, such as palm oil, palm kernel oil, and coconut oil. Avoid foods with partially hydrogenated oils in them. These have trans fats. Examples are stick margarine, some tub margarines, cookies, crackers, and other baked goods. What foods can I eat? Fruits All fresh, canned (in natural juice), or frozen fruits. Vegetables Fresh or frozen vegetables (raw, steamed, roasted, or grilled). Green salads. Grains Most grains. Choose whole wheat and whole grains most of the time. Rice andpasta, including brown rice and pastas made with whole wheat. Meats and other proteins Lean, well-trimmed beef, veal, pork, and lamb. Chicken and turkey without skin. All fish and shellfish. Wild duck, rabbit, pheasant, and venison. Egg whites or low-cholesterol egg substitutes. Dried beans, peas, lentils, and tofu. Seedsand most nuts. Dairy Low-fat or nonfat cheeses, including ricotta and mozzarella. Skim or 1% milk that is liquid, powdered, or evaporated. Buttermilk that is made with low-fatmilk. Nonfat or low-fat yogurt. Fats and oils Non-hydrogenated (trans-free) margarines. Vegetable oils, including soybean, sesame,   sunflower, olive, peanut, safflower, corn, canola, and cottonseed. Salad dressings or mayonnaisemade with a vegetable oil. Beverages Mineral water. Coffee and tea. Diet carbonated beverages. Sweets and desserts Sherbet, gelatin, and fruit ice. Small  amounts of dark chocolate. Limit all sweets and desserts. Seasonings and condiments All seasonings and condiments. The items listed above may not be a complete list of foods and drinks you can eat. Contact a dietitian for more options. What foods should I avoid? Fruits Canned fruit in heavy syrup. Fruit in cream or butter sauce. Fried fruit. Limitcoconut. Vegetables Vegetables cooked in cheese, cream, or butter sauce. Fried vegetables. Grains Breads that are made with saturated or trans fats, oils, or whole milk. Croissants. Sweet rolls. Donuts. High-fat crackers,such as cheese crackers. Meats and other proteins Fatty meats, such as hot dogs, ribs, sausage, bacon, rib-eye roast or steak. High-fat deli meats, such as salami and bologna. Caviar. Domestic duck andgoose. Organ meats, such as liver. Dairy Cream, sour cream, cream cheese, and creamed cottage cheese. Whole-milk cheeses. Whole or 2% milk that is liquid, evaporated, or condensed. Whole buttermilk. Cream sauce or high-fat cheese sauce. Yogurt that is made fromwhole milk. Fats and oils Meat fat, or shortening. Cocoa butter, hydrogenated oils, palm oil, coconut oil, palm kernel oil. Solid fats and shortenings, including bacon fat, salt pork, lard, and butter. Nondairy cream substitutes. Salad dressings with cheeseor sour cream. Beverages Regular sodas and juice drinks with added sugar. Sweets and desserts Frosting. Pudding. Cookies. Cakes. Pies. Milk chocolate or white chocolate.Buttered syrups. Full-fat ice cream or ice cream drinks. The items listed above may not be a complete list of foods and drinks to avoid. Contact a dietitian for more information. Summary Heart-healthy meal planning includes eating less unhealthy fats, eating more healthy fats, and making other changes in your diet. Eat a balanced diet. This includes fruits and vegetables, low-fat or nonfat dairy, lean protein, nuts and legumes, whole grains, and heart-healthy  oils and fats. This information is not intended to replace advice given to you by your health care provider. Make sure you discuss any questions you have with your healthcare provider. Document Revised: 07/14/2017 Document Reviewed: 06/17/2017 Elsevier Patient Education  2022 [employer]vier Inc.  

## 2020-12-26 ENCOUNTER — Encounter: Payer: Self-pay | Admitting: Internal Medicine

## 2020-12-26 DIAGNOSIS — I700 Atherosclerosis of aorta: Secondary | ICD-10-CM | POA: Insufficient documentation

## 2020-12-26 DIAGNOSIS — N400 Enlarged prostate without lower urinary tract symptoms: Secondary | ICD-10-CM | POA: Insufficient documentation

## 2020-12-26 DIAGNOSIS — I10 Essential (primary) hypertension: Secondary | ICD-10-CM | POA: Insufficient documentation

## 2020-12-26 LAB — COMPLETE METABOLIC PANEL WITH GFR
AG Ratio: 1.7 (calc) (ref 1.0–2.5)
ALT: 31 U/L (ref 9–46)
AST: 24 U/L (ref 10–35)
Albumin: 4 g/dL (ref 3.6–5.1)
Alkaline phosphatase (APISO): 82 U/L (ref 35–144)
BUN: 18 mg/dL (ref 7–25)
CO2: 23 mmol/L (ref 20–32)
Calcium: 8.9 mg/dL (ref 8.6–10.3)
Chloride: 105 mmol/L (ref 98–110)
Creat: 0.88 mg/dL (ref 0.70–1.35)
Globulin: 2.3 g/dL (calc) (ref 1.9–3.7)
Glucose, Bld: 381 mg/dL — ABNORMAL HIGH (ref 65–139)
Potassium: 4.5 mmol/L (ref 3.5–5.3)
Sodium: 136 mmol/L (ref 135–146)
Total Bilirubin: 0.6 mg/dL (ref 0.2–1.2)
Total Protein: 6.3 g/dL (ref 6.1–8.1)
eGFR: 95 mL/min/{1.73_m2} (ref >=60)

## 2020-12-26 LAB — LIPID PANEL
Cholesterol: 266 mg/dL — ABNORMAL HIGH (ref <200)
HDL: 41 mg/dL (ref >=40)
LDL Cholesterol (Calc): 159 mg/dL (calc) — ABNORMAL HIGH
Non-HDL Cholesterol (Calc): 225 mg/dL (calc) — ABNORMAL HIGH (ref <130)
Total CHOL/HDL Ratio: 6.5 (calc) — ABNORMAL HIGH (ref <5.0)
Triglycerides: 387 mg/dL — ABNORMAL HIGH (ref <150)

## 2020-12-26 LAB — HEMOGLOBIN A1C
Hgb A1c MFr Bld: 8.8 % of total Hgb — ABNORMAL HIGH (ref <5.7)
Mean Plasma Glucose: 206 mg/dL
eAG (mmol/L): 11.4 mmol/L

## 2020-12-26 LAB — CBC
HCT: 51.2 % — ABNORMAL HIGH (ref 38.5–50.0)
Hemoglobin: 16.9 g/dL (ref 13.2–17.1)
MCH: 30.6 pg (ref 27.0–33.0)
MCHC: 33 g/dL (ref 32.0–36.0)
MCV: 92.6 fL (ref 80.0–100.0)
MPV: 10 fL (ref 7.5–12.5)
Platelets: 195 10*3/uL (ref 140–400)
RBC: 5.53 10*6/uL (ref 4.20–5.80)
RDW: 12.5 % (ref 11.0–15.0)
WBC: 5.6 10*3/uL (ref 3.8–10.8)

## 2020-12-26 NOTE — Assessment & Plan Note (Signed)
Asymptomatic on Flomax Consider weaning this if he remains asymptomatic

## 2020-12-26 NOTE — Assessment & Plan Note (Signed)
Encourage diet and exercise for weight loss 

## 2020-12-26 NOTE — Assessment & Plan Note (Signed)
Encourage regular physical activity Encourage weight loss as this can help reduce joint pain Continue Meloxicam and IcyHot C-Met today

## 2020-12-26 NOTE — Assessment & Plan Note (Signed)
Elevated today Manual repeat WNL Encouraged DASH diet and exercise for weight loss Continue Lisinopril C-Met today

## 2020-12-26 NOTE — Assessment & Plan Note (Signed)
C-Met and lipid profile today Encouraged him to consume a low-fat diet Continue Atorvastatin and Zetia

## 2020-12-26 NOTE — Assessment & Plan Note (Signed)
A1c today No urine microalbumin secondary to ACEI therapy Encouraged him to consume a low-carb diet and exercise weight loss We will obtain a copy of his eye exam Encourage routine foot exams Encouraged him to get a flu shot in the fall He declines pneumonia vaccines Encouraged him to get his COVID booster

## 2020-12-26 NOTE — Assessment & Plan Note (Signed)
We will trial Trazodone 50 mg p.o. nightly

## 2021-01-09 ENCOUNTER — Ambulatory Visit (INDEPENDENT_AMBULATORY_CARE_PROVIDER_SITE_OTHER): Payer: Medicare Other | Admitting: Internal Medicine

## 2021-01-09 ENCOUNTER — Other Ambulatory Visit: Payer: Self-pay

## 2021-01-09 ENCOUNTER — Encounter: Payer: Self-pay | Admitting: Internal Medicine

## 2021-01-09 VITALS — BP 133/86 | HR 99 | Temp 97.3°F | Resp 17 | Ht 66.0 in | Wt 220.6 lb

## 2021-01-09 DIAGNOSIS — M25521 Pain in right elbow: Secondary | ICD-10-CM | POA: Diagnosis not present

## 2021-01-09 DIAGNOSIS — W19XXXA Unspecified fall, initial encounter: Secondary | ICD-10-CM

## 2021-01-09 DIAGNOSIS — M25511 Pain in right shoulder: Secondary | ICD-10-CM | POA: Diagnosis not present

## 2021-01-09 DIAGNOSIS — G8929 Other chronic pain: Secondary | ICD-10-CM

## 2021-01-09 MED ORDER — PREDNISONE 10 MG PO TABS: ORAL_TABLET | ORAL | 0 refills | Status: DC

## 2021-01-09 NOTE — Progress Notes (Signed)
Subjective:    Patient ID: Craig Bruce, male    DOB: 05/08/1956, 65 y.o.   MRN: [id number]  HPI  Patient presents to clinic today with complaint of right shoulder and right elbow pain.  He reports this started 2 months ago after tripping over something and catching himself with his right hand..  He describes the pain as burning.  The pain is worse with movement.  The pain does not radiate.  He denies numbness, tingling or weakness of his right upper extremity.  He has taken Meloxicam, Ibuprofen and Aleve with some relief of symptoms.  Review of Systems  Past Medical History:  Diagnosis Date   Annual physical exam 03/24/2015   Arthritis    BOTH HANDS   Chronic kidney disease    H/O KIDNEY STONES   DVT (deep venous thrombosis) ([place]) 1996   Gastritis    Immunization due 03/24/2015   Need for influenza vaccination 03/24/2015   Obesity (BMI 30.0-34.9) 02/17/2015   Smoker 02/17/2015    Current Outpatient Medications  Medication Sig Dispense Refill   atorvastatin (LIPITOR) 40 MG tablet TAKE 1 TABLET (40 MG TOTAL) DAILY BY MOUTH. 90 tablet 4   ezetimibe (ZETIA) 10 MG tablet TAKE 1 TABLET BY MOUTH EVERY DAY 90 tablet 1   lisinopril (ZESTRIL) 5 MG tablet Take 1 tablet (5 mg total) by mouth daily. 90 tablet 1   meloxicam (MOBIC) 15 MG tablet      tamsulosin (FLOMAX) 0.4 MG CAPS capsule TAKE 1 CAPSULE (0.4 MG TOTAL) BY MOUTH DAILY AFTER BREAKFAST. 90 capsule 3   traZODone (DESYREL) 50 MG tablet Take 1 tablet (50 mg total) by mouth at bedtime as needed for sleep. 30 tablet 2   No current facility-administered medications for this visit.    Allergies  Allergen Reactions   Other     BLOOD THINNER USED 20 YEARS AGO-PT UNSURE EXACTLY OF NAME-CAUSED FEVERS OF 104 PER PT    Family History  Problem Relation Age of Onset   Prostate cancer Neg Hx    Kidney cancer Neg Hx    Kidney failure Neg Hx     Social History   Socioeconomic History   Marital status: Single    Spouse name: Not  on file   Number of children: Not on file   Years of education: Not on file   Highest education level: Not on file  Occupational History   Not on file  Tobacco Use   Smoking status: Every Day    Packs/day: 0.25    Years: 20.00    Pack years: 5.00    Types: Cigarettes   Smokeless tobacco: Never   Tobacco comments:    Previously smoked 1ppd.  Cutting back and down from 5 cigs / day to 4 cigs per day in 4 weeks.  Vaping Use   Vaping Use: Never used  Substance and Sexual Activity   Alcohol use: Yes    Alcohol/week: 2.0 standard drinks    Types: 2 Cans of beer per week    Comment: BEER OCC   Drug use: No   Sexual activity: Not Currently  Other Topics Concern   Not on file  Social History Narrative   Not on file   Social Determinants of Health   Financial Resource Strain: Not on file  Food Insecurity: Not on file  Transportation Needs: Not on file  Physical Activity: Not on file  Stress: Not on file  Social Connections: Not on file  Intimate Partner  Violence: Not on file     Constitutional: Denies fever, malaise, fatigue, headache or abrupt weight changes.  Respiratory: Denies difficulty breathing, shortness of breath, cough or sputum production.   Cardiovascular: Denies chest pain, chest tightness, palpitations or swelling in the hands or feet.  Musculoskeletal: Patient reports right shoulder and elbow pain.  Denies decrease in range of motion, difficulty with gait, muscle pain or joint swelling.  Skin: Denies redness, rashes, lesions or ulcercations.  Neurological: Denies numbness, tingling or weaknes of her extremities..    No other specific complaints in a complete review of systems (except as listed in HPI above).     Objective:   Physical Exam  BP 133/86 (BP Location: Right Arm, Patient Position: Sitting, Cuff Size: Large)   Pulse 99   Temp (!) 97.3 F (36.3 C) (Temporal)   Resp 17   Ht '5\' 6"'$  (1.676 m)   Wt 220 lb 9.6 oz (100.1 kg)   SpO2 100%   BMI  35.61 kg/m   Wt Readings from Last 3 Encounters:  12/25/20 219 lb (99.3 kg)  11/18/20 217 lb 12.8 oz (98.8 kg)  09/24/20 217 lb (98.4 kg)    General: Appears his stated age, obese, in NAD. Skin: Warm, dry and intact. No rashes noted. Cardiovascular: Normal rate and rhythm.  Radial pulse 2+ on the right. Pulmonary/Chest: Normal effort and positive vesicular breath sounds.  Musculoskeletal: Decreased internal and external rotation of the right shoulder secondary to pain.  Pain with palpation of the proximal anterior biceps tendon.  Negative drop can test on the right.  Normal flexion, extension and rotation of the right elbow.  No joint swelling noted.  Strength 5/5 BUE/BLE.  Handgrips equal. Neurological: Alert and oriented.    BMET    Component Value Date/Time   NA 136 12/25/2020 0832   NA 140 03/25/2015 0802   NA 137 05/19/2013 0254   K 4.5 12/25/2020 0832   K 3.9 05/19/2013 0254   CL 105 12/25/2020 0832   CL 107 05/19/2013 0254   CO2 23 12/25/2020 0832   CO2 25 05/19/2013 0254   GLUCOSE 381 (H) 12/25/2020 0832   GLUCOSE 124 (H) 05/19/2013 0254   BUN 18 12/25/2020 0832   BUN 15 03/25/2015 0802   BUN 14 05/19/2013 0254   CREATININE 0.88 12/25/2020 0832   CALCIUM 8.9 12/25/2020 0832   CALCIUM 8.5 05/19/2013 0254   GFRNONAA >60 12/09/2018 0500   GFRNONAA 96 11/03/2017 0821   GFRAA >60 12/09/2018 0500   GFRAA 111 11/03/2017 0821    Lipid Panel     Component Value Date/Time   CHOL 266 (H) 12/25/2020 0832   CHOL 243 (H) 03/25/2015 0802   TRIG 387 (H) 12/25/2020 0832   HDL 41 12/25/2020 0832   HDL 38 (L) 03/25/2015 0802   CHOLHDL 6.5 (H) 12/25/2020 0832   VLDL 48 (H) 10/01/2016 1140   LDLCALC 159 (H) 12/25/2020 0832    CBC    Component Value Date/Time   WBC 5.6 12/25/2020 0832   RBC 5.53 12/25/2020 0832   HGB 16.9 12/25/2020 0832   HGB 16.0 03/25/2015 0802   HCT 51.2 (H) 12/25/2020 0832   HCT 45.1 03/25/2015 0802   PLT 195 12/25/2020 0832   PLT 190  03/25/2015 0802   MCV 92.6 12/25/2020 0832   MCV 90 03/25/2015 0802   MCV 89 05/19/2013 0254   MCH 30.6 12/25/2020 0832   MCHC 33.0 12/25/2020 0832   RDW 12.5 12/25/2020 [id number]  RDW 13.4 03/25/2015 0802   RDW 13.2 05/19/2013 0254   LYMPHSABS 2.2 12/09/2018 0500   LYMPHSABS 1.6 03/25/2015 0802   LYMPHSABS 1.4 05/19/2013 0254   MONOABS 0.8 12/09/2018 0500   MONOABS 1.2 (H) 05/19/2013 0254   EOSABS 0.1 12/09/2018 0500   EOSABS 0.2 03/25/2015 0802   EOSABS 0.1 05/19/2013 0254   BASOSABS 0.0 12/09/2018 0500   BASOSABS 0.0 03/25/2015 0802   BASOSABS 0.1 05/19/2013 0254    Hgb A1C Lab Results  Component Value Date   HGBA1C 8.8 (H) 12/25/2020           Assessment & Plan:   Chronic Right Elbow Pain, Chronic Right Shoulder Pain s/p Fall:  X-ray right elbow X-ray right shoulder Rx for Pred taper x6 days, avoid NSAIDs OTC while on prednisone Encouraged ice as needed Shoulder exercises given  We will follow-up after x-rays, return precautions discussed Webb Silversmith, NP This visit occurred during the SARS-CoV-2 public health emergency.  Safety protocols were in place, including screening questions prior to the visit, additional usage of staff PPE, and extensive cleaning of exam room while observing appropriate contact time as indicated for disinfecting solutions.

## 2021-01-09 NOTE — Patient Instructions (Signed)
Proximal Biceps Tendinitis and Tenosynovitis  The proximal biceps tendon is a strong cord of tissue that connects the biceps muscle on the front of the upper arm to the shoulder blade. Tendinitis is inflammation of a tendon. Tenosynovitis is inflammation of the lining around the tendon (tendon sheath). These conditions often occur at the same time, and they can interfere withthe ability to bend the elbow and turn the palm of the hand up. Proximal biceps tendinitis and tenosynovitis are usually caused by overusing the shoulder joint and the biceps muscle. These conditions usually heal within 6 weeks. Proximal biceps tendinitis may include a grade 1 or grade 2 strain of the tendon. A grade 1 strain is mild, and it involves a slight pull of the tendon without any stretching or noticeable tearing of the tendon. There is usually no loss of biceps muscle strength. A grade 2 strain is moderate, and it involves a small tear in the tendon. The tendon is stretched, and biceps strength is usually decreased. What are the causes? This condition may be caused by: A sudden increase in frequency or intensity of activity that involves the shoulder and the biceps muscle. Overuse of the biceps muscle. This can happen when you do the same movements over and over, such as: Turning the palm of the hand up. Forceful straightening (hyperextension) of the elbow. Bending the elbow. A direct, forceful hit or injury to the elbow. This is rare. What increases the risk? The following factors may make you more likely to develop this condition: Playing contact sports. Playing sports that involve throwing and overhead movements, including racket sports, gymnastics, weight lifting, or bodybuilding. Doing physical labor. Having poor strength and flexibility of the arm and shoulder. What are the signs or symptoms? Symptoms of this condition may include: Pain and inflammation in the front of the shoulder. A feeling of warmth in  the front of the shoulder. Limited range of motion of the shoulder and the elbow. A crackling sound (crepitation) when you move or touch the shoulder or the upper arm. In some cases, symptoms may return after treatment, and they may be long-lasting (chronic). How is this diagnosed? This condition is diagnosed based on: Your symptoms. Your medical history. Physical exam. X-ray or MRI, if needed. How is this treated? Treatment for this condition depends on the severity of your injury. It may include: Resting the injured arm. Icing the injured area. Doing physical therapy. Your health care provider may also use: Medicines to treat pain and inflammation. Sound waves to treat the injured muscle (ultrasound therapy). Medicines that are injected to the muscle (corticosteroids). Medicines that numb the area (local anesthetics). Surgery. This is done if other treatments have not worked. Follow these instructions at home: Managing pain, stiffness, and swelling     If directed, put ice on the injured area. Put ice in a plastic bag. Place a towel between your skin and the bag. Leave the ice on for 20 minutes, 2-3 times a day. If directed, apply heat to the affected area before you exercise. Use the heat source that your health care provider recommends, such as a moist heat pack or a heating pad. Place a towel between your skin and the heat source. Leave the heat on for 20-30 minutes. Remove the heat if your skin turns bright red. This is especially important if you are unable to feel pain, heat, or cold. You may have a greater risk of getting burned. Move your fingers often to reduce stiffness and swelling.  Raise (elevate) the injured area above the level of your heart while you are lying down. Activity Do not lift anything that is heavier than 10 lb (4.5 kg), or the limit that you are told, until your health care provider says that it is safe. Avoid activities that cause pain or make your  condition worse. Return to your normal activities as told by your health care provider. Ask your health care provider what activities are safe for you. Do exercises as told by your health care provider. General instructions Take over-the-counter and prescription medicines only as told by your health care provider. Do not use any products that contain nicotine or tobacco, such as cigarettes, e-cigarettes, and chewing tobacco. These can delay healing. If you need help quitting, ask your health care provider. Keep all follow-up visits as told by your health care provider. This is important. How is this prevented? Warm up and stretch before being active. Cool down and stretch after being active. Give your body time to rest between periods of activity. Make sure any equipment that you use is fitted to you. Be safe and responsible while being active to avoid falls. Maintain physical fitness, including: Strength. Flexibility. Heart health (cardiovascular fitness). The ability to use muscles for a long time (endurance). Contact a health care provider if: You have symptoms that get worse or do not get better after 2 weeks of treatment. You develop new symptoms. Get help right away if: You develop severe pain. Summary Tendinitis is inflammation of the biceps tendon. Tenosynovitis is inflammation of the lining around the biceps tendon. These conditions often occur at the same time. These conditions are usually caused by overusing the shoulder joint and biceps muscle. Symptoms include pain, warmth in the shoulder, and limited range of motion. The two conditions are treated with rest, ice, medicines, and surgery (rare). This information is not intended to replace advice given to you by your health care provider. Make sure you discuss any questions you have with your healthcare provider. Document Revised: 09/05/2018 Document Reviewed: 07/06/2018 Elsevier Patient Education  [employer].

## 2021-01-16 ENCOUNTER — Other Ambulatory Visit: Payer: Self-pay | Admitting: Internal Medicine

## 2021-01-16 NOTE — Telephone Encounter (Signed)
Requested medication (s) are due for refill today:   Yes Requested medication (s) are on the active medication list:   Yes  Future visit scheduled:   Yes   Last ordered: 12/25/2020 #30, 2 refills  Returned because pharmacy is requesting a 90 day supply with 1 refill  to last until appt in Feb. 2023.   Requested Prescriptions  Pending Prescriptions Disp Refills   traZODone (DESYREL) 50 MG tablet [Pharmacy Med Name: TRAZODONE 50 MG TABLET] 90 tablet 1    Sig: TAKE 1 TABLET BY MOUTH AT BEDTIME AS NEEDED FOR SLEEP.     Psychiatry: Antidepressants - Serotonin Modulator Passed - 01/16/2021  8:32 AM      Passed - Valid encounter within last 6 months    Recent Outpatient Visits           1 week ago Chronic right shoulder pain   [facility] [place], Coralie Keens, NP   3 weeks ago Diet-controlled type 2 diabetes mellitus [facility])   [facility] [place], Coralie Keens, NP   1 month ago Acute left-sided low back pain without sciatica   [facility] [place], Coralie Keens, NP   3 months ago Lower urinary tract symptoms (LUTS)   [place], DO   5 months ago Diet-controlled type 2 diabetes mellitus [facility])   [facility] Olin Hauser, DO       Future Appointments             In 5 months Baity, Coralie Keens, NP [facility] [place], [facility]

## 2021-07-02 ENCOUNTER — Encounter: Payer: Self-pay | Admitting: Internal Medicine

## 2021-07-02 ENCOUNTER — Ambulatory Visit (INDEPENDENT_AMBULATORY_CARE_PROVIDER_SITE_OTHER): Payer: Medicare Other | Admitting: Internal Medicine

## 2021-07-02 ENCOUNTER — Other Ambulatory Visit: Payer: Self-pay

## 2021-07-02 VITALS — BP 133/73 | HR 89 | Temp 97.1°F | Wt 210.0 lb

## 2021-07-02 DIAGNOSIS — F17200 Nicotine dependence, unspecified, uncomplicated: Secondary | ICD-10-CM | POA: Diagnosis not present

## 2021-07-02 DIAGNOSIS — Z6833 Body mass index (BMI) 33.0-33.9, adult: Secondary | ICD-10-CM

## 2021-07-02 DIAGNOSIS — E1165 Type 2 diabetes mellitus with hyperglycemia: Secondary | ICD-10-CM | POA: Diagnosis not present

## 2021-07-02 DIAGNOSIS — Z125 Encounter for screening for malignant neoplasm of prostate: Secondary | ICD-10-CM

## 2021-07-02 DIAGNOSIS — Z0001 Encounter for general adult medical examination with abnormal findings: Secondary | ICD-10-CM | POA: Diagnosis not present

## 2021-07-02 DIAGNOSIS — E6609 Other obesity due to excess calories: Secondary | ICD-10-CM

## 2021-07-02 MED ORDER — ASPIRIN 81 MG PO TBEC: 81.0000 mg | DELAYED_RELEASE_TABLET | Freq: Every day | ORAL | 12 refills | Status: AC

## 2021-07-02 NOTE — Progress Notes (Signed)
Subjective:    Patient ID: Craig Bruce, male    DOB: 09-14-1955, 66 y.o.   MRN: [id number]  HPI  Pt presents to the clinic today for his annual exam.  Flu: 04/2021 Tetanus: 02/2015 Covid: Pfizer x 2 Pneumovax: never Prevnar: never Shingrix: never PSA screening: 09/2016 Colon screening: 07/2020 Vision screening: as needed Dentist: as needed  Diet: He does eat lean meat. He consumes fruits and veggies. He tries to void fried foods. He drinks mostly water, dt. soda Exercise: Walking  Review of Systems     Past Medical History:  Diagnosis Date   Annual physical exam 03/24/2015   Arthritis    BOTH HANDS   Chronic kidney disease    H/O KIDNEY STONES   DVT (deep venous thrombosis) ([place]) 1996   Gastritis    Immunization due 03/24/2015   Need for influenza vaccination 03/24/2015   Obesity (BMI 30.0-34.9) 02/17/2015   Smoker 02/17/2015    Current Outpatient Medications  Medication Sig Dispense Refill   atorvastatin (LIPITOR) 40 MG tablet TAKE 1 TABLET (40 MG TOTAL) DAILY BY MOUTH. 90 tablet 4   ezetimibe (ZETIA) 10 MG tablet TAKE 1 TABLET BY MOUTH EVERY DAY 90 tablet 1   lisinopril (ZESTRIL) 5 MG tablet Take 1 tablet (5 mg total) by mouth daily. 90 tablet 1   meloxicam (MOBIC) 15 MG tablet      predniSONE (DELTASONE) 10 MG tablet Take 6 tabs on day 1, 5 tabs on day 2, 4 tabs on day 3, 3 tabs on day 4, 2 tabs on day 5, 1 tab on day 6 21 tablet 0   tamsulosin (FLOMAX) 0.4 MG CAPS capsule TAKE 1 CAPSULE (0.4 MG TOTAL) BY MOUTH DAILY AFTER BREAKFAST. 90 capsule 3   traZODone (DESYREL) 50 MG tablet TAKE 1 TABLET BY MOUTH AT BEDTIME AS NEEDED FOR SLEEP. 90 tablet 1   No current facility-administered medications for this visit.    Allergies  Allergen Reactions   Other     BLOOD THINNER USED 20 YEARS AGO-PT UNSURE EXACTLY OF NAME-CAUSED FEVERS OF 104 PER PT    Family History  Problem Relation Age of Onset   Prostate cancer Neg Hx    Kidney cancer Neg Hx    Kidney  failure Neg Hx     Social History   Socioeconomic History   Marital status: Single    Spouse name: Not on file   Number of children: Not on file   Years of education: Not on file   Highest education level: Not on file  Occupational History   Not on file  Tobacco Use   Smoking status: Every Day    Packs/day: 0.25    Years: 20.00    Pack years: 5.00    Types: Cigarettes   Smokeless tobacco: Never   Tobacco comments:    Previously smoked 1ppd.  Cutting back and down from 5 cigs / day to 4 cigs per day in 4 weeks.  Vaping Use   Vaping Use: Never used  Substance and Sexual Activity   Alcohol use: Yes    Alcohol/week: 2.0 standard drinks    Types: 2 Cans of beer per week    Comment: BEER OCC   Drug use: No   Sexual activity: Not Currently  Other Topics Concern   Not on file  Social History Narrative   Not on file   Social Determinants of Health   Financial Resource Strain: Not on file  Food Insecurity: Not on  file  Transportation Needs: Not on file  Physical Activity: Not on file  Stress: Not on file  Social Connections: Not on file  Intimate Partner Violence: Not on file     Constitutional: Denies fever, malaise, fatigue, headache or abrupt weight changes.  HEENT: Denies eye pain, eye redness, ear pain, ringing in the ears, wax buildup, runny nose, nasal congestion, bloody nose, or sore throat. Respiratory: Denies difficulty breathing, shortness of breath, cough or sputum production.   Cardiovascular: Denies chest pain, chest tightness, palpitations or swelling in the hands or feet.  Gastrointestinal: Denies abdominal pain, bloating, constipation, diarrhea or blood in the stool.  GU: Denies urgency, frequency, pain with urination, burning sensation, blood in urine, odor or discharge. Musculoskeletal: Pt reports intermittent joint pain. Denies decrease in range of motion, difficulty with gait, muscle pain or joint swelling.  Skin: Denies redness, rashes, lesions or  ulcercations.  Neurological: Pt reports insomnia. Denies dizziness, difficulty with memory, difficulty with speech or problems with balance and coordination.  Psych: Denies anxiety, depression, SI/HI.  No other specific complaints in a complete review of systems (except as listed in HPI above).  Objective:   Physical Exam  BP 133/73 (BP Location: Left Arm, Patient Position: Sitting, Cuff Size: Large)    Pulse 89    Temp (!) 97.1 F (36.2 C) (Temporal)    Wt 210 lb (95.3 kg)    SpO2 97%    BMI 33.89 kg/m   Wt Readings from Last 3 Encounters:  01/09/21 220 lb 9.6 oz (100.1 kg)  12/25/20 219 lb (99.3 kg)  11/18/20 217 lb 12.8 oz (98.8 kg)    General: Appears their stated age, obese, in NAD. Skin: Warm, dry and intact. No ulcerations noted. HEENT: Head: normal shape and size; Eyes: sclera white and EOMs intact;  Neck:  Neck supple, trachea midline. No masses, lumps or thyromegaly present.  Cardiovascular: Normal rate and rhythm. S1,S2 noted.  No murmur, rubs or gallops noted.  Trace BLE edema. No carotid bruits noted. Pulmonary/Chest: Normal effort and diminished vesicular breath sounds. No respiratory distress. No wheezes, rales or ronchi noted.  Abdomen: Soft and nontender. Normal bowel sounds.  Musculoskeletal: Strength 5/5 BUE/BLE.  No difficulty with gait.  Neurological: Alert and oriented. Cranial nerves II-XII grossly intact. Coordination normal.  Psychiatric: Mood and affect normal. Behavior is normal. Judgment and thought content normal.    BMET    Component Value Date/Time   NA 136 12/25/2020 0832   NA 140 03/25/2015 0802   NA 137 05/19/2013 0254   K 4.5 12/25/2020 0832   K 3.9 05/19/2013 0254   CL 105 12/25/2020 0832   CL 107 05/19/2013 0254   CO2 23 12/25/2020 0832   CO2 25 05/19/2013 0254   GLUCOSE 381 (H) 12/25/2020 0832   GLUCOSE 124 (H) 05/19/2013 0254   BUN 18 12/25/2020 0832   BUN 15 03/25/2015 0802   BUN 14 05/19/2013 0254   CREATININE 0.88 12/25/2020  0832   CALCIUM 8.9 12/25/2020 0832   CALCIUM 8.5 05/19/2013 0254   GFRNONAA >60 12/09/2018 0500   GFRNONAA 96 11/03/2017 0821   GFRAA >60 12/09/2018 0500   GFRAA 111 11/03/2017 0821    Lipid Panel     Component Value Date/Time   CHOL 266 (H) 12/25/2020 0832   CHOL 243 (H) 03/25/2015 0802   TRIG 387 (H) 12/25/2020 0832   HDL 41 12/25/2020 0832   HDL 38 (L) 03/25/2015 0802   CHOLHDL 6.5 (H) 12/25/2020 8592  VLDL 48 (H) 10/01/2016 1140   LDLCALC 159 (H) 12/25/2020 0832    CBC    Component Value Date/Time   WBC 5.6 12/25/2020 0832   RBC 5.53 12/25/2020 0832   HGB 16.9 12/25/2020 0832   HGB 16.0 03/25/2015 0802   HCT 51.2 (H) 12/25/2020 0832   HCT 45.1 03/25/2015 0802   PLT 195 12/25/2020 0832   PLT 190 03/25/2015 0802   MCV 92.6 12/25/2020 0832   MCV 90 03/25/2015 0802   MCV 89 05/19/2013 0254   MCH 30.6 12/25/2020 0832   MCHC 33.0 12/25/2020 0832   RDW 12.5 12/25/2020 0832   RDW 13.4 03/25/2015 0802   RDW 13.2 05/19/2013 0254   LYMPHSABS 2.2 12/09/2018 0500   LYMPHSABS 1.6 03/25/2015 0802   LYMPHSABS 1.4 05/19/2013 0254   MONOABS 0.8 12/09/2018 0500   MONOABS 1.2 (H) 05/19/2013 0254   EOSABS 0.1 12/09/2018 0500   EOSABS 0.2 03/25/2015 0802   EOSABS 0.1 05/19/2013 0254   BASOSABS 0.0 12/09/2018 0500   BASOSABS 0.0 03/25/2015 0802   BASOSABS 0.1 05/19/2013 0254    Hgb A1C Lab Results  Component Value Date   HGBA1C 8.8 (H) 12/25/2020           Assessment & Plan:   Preventative Health Maintenance:  Flu shot UTD Tetanus UTD He declines Prevnar today he declines Pneumovax We will get Pneumovax in 1 year Encouraged him to get his COVID booster Discussed Shingrix vaccine, he will check coverage with his insurance company Colon screening UTD Encouraged him to consume a balanced diet and exercise regimen Advised him to see an eye doctor and dentist annually We will check CBC, c-Met, lipid, PSA, A1c and urine microalbumin today  Smoker:  Half pack  per day for 30 years Will order low-dose CT lung cancer screening Encourage smoking cessation  RTC in 3 months, follow-up chronic conditions Webb Silversmith, NP This visit occurred during the SARS-CoV-2 public health emergency.  Safety protocols were in place, including screening questions prior to the visit, additional usage of staff PPE, and extensive cleaning of exam room while observing appropriate contact time as indicated for disinfecting solutions.

## 2021-07-02 NOTE — Patient Instructions (Signed)
Health Maintenance After Age 67 After age 67, you are at a higher risk for certain long-term diseases and infections as well as injuries from falls. Falls are a major cause of broken bones and head injuries in people who are older than age 67. Getting regular preventive care can help to keep you healthy and well. Preventive care includes getting regular testing and making lifestyle changes as recommended by your health care provider. Talk with your health care provider about: Which screenings and tests you should have. A screening is a test that checks for a disease when you have no symptoms. A diet and exercise plan that is right for you. What should I know about screenings and tests to prevent falls? Screening and testing are the best ways to find a health problem early. Early diagnosis and treatment give you the best chance of managing medical conditions that are common after age 67. Certain conditions and lifestyle choices may make you more likely to have a fall. Your health care provider may recommend: Regular vision checks. Poor vision and conditions such as cataracts can make you more likely to have a fall. If you wear glasses, make sure to get your prescription updated if your vision changes. Medicine review. Work with your health care provider to regularly review all of the medicines you are taking, including over-the-counter medicines. Ask your health care provider about any side effects that may make you more likely to have a fall. Tell your health care provider if any medicines that you take make you feel dizzy or sleepy. Strength and balance checks. Your health care provider may recommend certain tests to check your strength and balance while standing, walking, or changing positions. Foot health exam. Foot pain and numbness, as well as not wearing proper footwear, can make you more likely to have a fall. Screenings, including: Osteoporosis screening. Osteoporosis is a condition that causes  the bones to get weaker and break more easily. Blood pressure screening. Blood pressure changes and medicines to control blood pressure can make you feel dizzy. Depression screening. You may be more likely to have a fall if you have a fear of falling, feel depressed, or feel unable to do activities that you used to do. Alcohol use screening. Using too much alcohol can affect your balance and may make you more likely to have a fall. Follow these instructions at home: Lifestyle Do not drink alcohol if: Your health care provider tells you not to drink. If you drink alcohol: Limit how much you have to: 0-1 drink a day for women. 0-2 drinks a day for men. Know how much alcohol is in your drink. In the [place] one drink equals one 12 oz bottle of beer (355 mL), one 5 oz glass of wine (148 mL), or one 1 oz glass of hard liquor (44 mL). Do not use any products that contain nicotine or tobacco. These products include cigarettes, chewing tobacco, and vaping devices, such as e-cigarettes. If you need help quitting, ask your health care provider. Activity  Follow a regular exercise program to stay fit. This will help you maintain your balance. Ask your health care provider what types of exercise are appropriate for you. If you need a cane or walker, use it as recommended by your health care provider. Wear supportive shoes that have nonskid soles. Safety  Remove any tripping hazards, such as rugs, cords, and clutter. Install safety equipment such as grab bars in bathrooms and safety rails on stairs. Keep rooms and walkways   well-lit. General instructions Talk with your health care provider about your risks for falling. Tell your health care provider if: You fall. Be sure to tell your health care provider about all falls, even ones that seem minor. You feel dizzy, tiredness (fatigue), or off-balance. Take over-the-counter and prescription medicines only as told by your health care provider. These include  supplements. Eat a healthy diet and maintain a healthy weight. A healthy diet includes low-fat dairy products, low-fat (lean) meats, and fiber from whole grains, beans, and lots of fruits and vegetables. Stay current with your vaccines. Schedule regular health, dental, and eye exams. Summary Having a healthy lifestyle and getting preventive care can help to protect your health and wellness after age 67. Screening and testing are the best way to find a health problem early and help you avoid having a fall. Early diagnosis and treatment give you the best chance for managing medical conditions that are more common for people who are older than age 67. Falls are a major cause of broken bones and head injuries in people who are older than age 67. Take precautions to prevent a fall at home. Work with your health care provider to learn what changes you can make to improve your health and wellness and to prevent falls. This information is not intended to replace advice given to you by your health care provider. Make sure you discuss any questions you have with your health care provider. Document Revised: 09/29/2020 Document Reviewed: 09/29/2020 Elsevier Patient Education  2022 [employer] Inc[employer]  

## 2021-07-02 NOTE — Assessment & Plan Note (Signed)
Encourage diet and exercise for weight loss 

## 2021-07-03 ENCOUNTER — Telehealth: Payer: Self-pay

## 2021-07-03 LAB — COMPLETE METABOLIC PANEL WITH GFR
AG Ratio: 1.6 (calc) (ref 1.0–2.5)
ALT: 28 U/L (ref 9–46)
AST: 20 U/L (ref 10–35)
Albumin: 4.1 g/dL (ref 3.6–5.1)
Alkaline phosphatase (APISO): 84 U/L (ref 35–144)
BUN: 17 mg/dL (ref 7–25)
CO2: 24 mmol/L (ref 20–32)
Calcium: 9 mg/dL (ref 8.6–10.3)
Chloride: 103 mmol/L (ref 98–110)
Creat: 0.83 mg/dL (ref 0.70–1.35)
Globulin: 2.6 g/dL (calc) (ref 1.9–3.7)
Glucose, Bld: 344 mg/dL — ABNORMAL HIGH (ref 65–99)
Potassium: 4.6 mmol/L (ref 3.5–5.3)
Sodium: 135 mmol/L (ref 135–146)
Total Bilirubin: 0.9 mg/dL (ref 0.2–1.2)
Total Protein: 6.7 g/dL (ref 6.1–8.1)
eGFR: 97 mL/min/{1.73_m2} (ref >=60)

## 2021-07-03 LAB — LIPID PANEL
Cholesterol: 305 mg/dL — ABNORMAL HIGH (ref <200)
HDL: 42 mg/dL (ref >=40)
LDL Cholesterol (Calc): 203 mg/dL (calc) — ABNORMAL HIGH
Non-HDL Cholesterol (Calc): 263 mg/dL (calc) — ABNORMAL HIGH (ref <130)
Total CHOL/HDL Ratio: 7.3 (calc) — ABNORMAL HIGH (ref <5.0)
Triglycerides: 353 mg/dL — ABNORMAL HIGH (ref <150)

## 2021-07-03 LAB — HEMOGLOBIN A1C
Hgb A1c MFr Bld: 10.5 % of total Hgb — ABNORMAL HIGH (ref <5.7)
Mean Plasma Glucose: 255 mg/dL
eAG (mmol/L): 14.1 mmol/L

## 2021-07-03 LAB — CBC
HCT: 53.7 % — ABNORMAL HIGH (ref 38.5–50.0)
Hemoglobin: 18.5 g/dL — ABNORMAL HIGH (ref 13.2–17.1)
MCH: 31.8 pg (ref 27.0–33.0)
MCHC: 34.5 g/dL (ref 32.0–36.0)
MCV: 92.3 fL (ref 80.0–100.0)
MPV: 10.1 fL (ref 7.5–12.5)
Platelets: 178 10*3/uL (ref 140–400)
RBC: 5.82 10*6/uL — ABNORMAL HIGH (ref 4.20–5.80)
RDW: 12.3 % (ref 11.0–15.0)
WBC: 6.3 10*3/uL (ref 3.8–10.8)

## 2021-07-03 LAB — PSA: PSA: 3.35 ng/mL (ref <=4.00)

## 2021-07-03 LAB — MICROALBUMIN / CREATININE URINE RATIO
Creatinine, Urine: 56 mg/dL (ref 20–320)
Microalb Creat Ratio: 9 mcg/mg creat (ref <30)
Microalb, Ur: 0.5 mg/dL

## 2021-07-03 NOTE — Telephone Encounter (Signed)
LMTCB 07/03/2021.  PEC Please advise pt of lab results when he calls back.   Thanks,   -Mickel Baas

## 2021-07-03 NOTE — Telephone Encounter (Signed)
-----   Message from Jearld Fenton, NP sent at 07/03/2021  2:30 PM EST ----- His hemoglobin and hematocrit is elevated.  This increases risk for clot.  If he smokes, this is likely the cause and he should quit if he can.  Otherwise I would recommend blood donation monthly to reduce his risk of clot.  A1c is up to 10.5%, not good.  We do have to start him on some sort of oral diabetic medication at this time.  Please let me know that he if he is agreeable and I will send in.  Liver and kidney function is normal.  Cholesterol is elevated which is increasing his risk for heart attack and stroke.  I recommend cholesterol-lowering medication at this time.  Please let me know if he is agreeable and I will send in.  PSA is normal.  Diabetes is not affecting his kidneys.  He should consume a low saturated fat, low carb diet and exercise for weight loss.  Would like for him to follow-up with me in 3 months.

## 2021-07-06 ENCOUNTER — Other Ambulatory Visit: Payer: Self-pay

## 2021-07-06 ENCOUNTER — Ambulatory Visit (INDEPENDENT_AMBULATORY_CARE_PROVIDER_SITE_OTHER): Payer: Medicare Other

## 2021-07-06 VITALS — BP 139/88 | HR 92 | Temp 97.1°F | Resp 18 | Ht 66.0 in | Wt 211.6 lb

## 2021-07-06 DIAGNOSIS — Z0000 Encounter for general adult medical examination without abnormal findings: Secondary | ICD-10-CM

## 2021-07-06 NOTE — Progress Notes (Addendum)
Patient presents to clinic today for their subsequent annual Medicare wellness exam.  Past Medical History:  Diagnosis Date   Annual physical exam 03/24/2015   Arthritis    BOTH HANDS   Chronic kidney disease    H/O KIDNEY STONES   DVT (deep venous thrombosis) ([place]) 1996   Gastritis    Immunization due 03/24/2015   Need for influenza vaccination 03/24/2015   Obesity (BMI 30.0-34.9) 02/17/2015   Smoker 02/17/2015    Current Outpatient Medications  Medication Sig Dispense Refill   aspirin 81 MG EC tablet Take 1 tablet (81 mg total) by mouth daily. Swallow whole. 30 tablet 12   atorvastatin (LIPITOR) 40 MG tablet TAKE 1 TABLET (40 MG TOTAL) DAILY BY MOUTH. 90 tablet 4   ezetimibe (ZETIA) 10 MG tablet TAKE 1 TABLET BY MOUTH EVERY DAY 90 tablet 1   lisinopril (ZESTRIL) 5 MG tablet Take 1 tablet (5 mg total) by mouth daily. 90 tablet 1   meloxicam (MOBIC) 15 MG tablet      tamsulosin (FLOMAX) 0.4 MG CAPS capsule TAKE 1 CAPSULE (0.4 MG TOTAL) BY MOUTH DAILY AFTER BREAKFAST. 90 capsule 3   traZODone (DESYREL) 50 MG tablet TAKE 1 TABLET BY MOUTH AT BEDTIME AS NEEDED FOR SLEEP. 90 tablet 1   No current facility-administered medications for this visit.    Allergies  Allergen Reactions   Other     BLOOD THINNER USED 20 YEARS AGO-PT UNSURE EXACTLY OF NAME-CAUSED FEVERS OF 104 PER PT    Family History  Problem Relation Age of Onset   Alcohol abuse Mother    Alcohol abuse Father    Prostate cancer Neg Hx    Kidney cancer Neg Hx    Kidney failure Neg Hx    Colon cancer Neg Hx     Social History   Socioeconomic History   Marital status: Significant Other    Spouse name: Not on file   Number of children: 1   Years of education: Not on file   Highest education level: Not on file  Occupational History   Occupation: retired    Comment: Retired 2022  Tobacco Use   Smoking status: Every Day    Packs/day: 0.25    Years: 20.00    Pack years: 5.00    Types: Cigarettes   Smokeless  tobacco: Never   Tobacco comments:    Previously smoked 1ppd.  Cutting back and down from 5 cigs / day to 4 cigs per day in 4 weeks.  Vaping Use   Vaping Use: Never used  Substance and Sexual Activity   Alcohol use: Yes    Alcohol/week: 2.0 standard drinks    Types: 2 Cans of beer per week    Comment: BEER OCC   Drug use: No   Sexual activity: Not Currently  Other Topics Concern   Not on file  Social History Narrative   Not on file   Social Determinants of Health   Financial Resource Strain: Low Risk    Difficulty of Paying Living Expenses: Not hard at all  Food Insecurity: No Food Insecurity   Worried About Charity fundraiser in the Last Year: Never true   Ran Out of Food in the Last Year: Never true  Transportation Needs: No Transportation Needs   Lack of Transportation (Medical): No   Lack of Transportation (Non-Medical): No  Physical Activity: Sufficiently Active   Days of Exercise per Week: 7 days   Minutes of Exercise per Session: 40 min  Stress: No Stress Concern Present   Feeling of Stress : Not at all  Social Connections: Socially Integrated   Frequency of Communication with Friends and Family: Twice a week   Frequency of Social Gatherings with Friends and Family: Once a week   Attends Religious Services: More than 4 times per year   Active Member of Clubs or Organizations: Yes   Attends Music therapist: More than 4 times per year   Marital Status: Living with partner  Intimate Partner Violence: Not At Risk   Fear of Current or Ex-Partner: No   Emotionally Abused: No   Physically Abused: No   Sexually Abused: No   Hospitalization: No Hospitalization over the last year  Health Maintenance:    Flu: 01/20/2021   Tetanus: 03/24/2015  Pneumovax: Declined  Prevnar: Declined    Shingrix: Not up-to-date  Covid: 09/14/2019, 08/24/2019   PSA: 3.35 ng/mL , 07/02/2021     Colon Screening: 09/11/2020,   Eye Doctor: 10/23/2020  Dental Exam: 2 yrs  ago              Foot Exam: 12/25/2020   Providers:   PCP: Webb Silversmith, NP    I have personally reviewed and have noted:  1. The patient's medical and social history 2. Their use of alcohol, tobacco or illicit drugs 3. Their current medications and supplements 4. The patient's functional ability including ADL's, fall risks, home safety risks and hearing or visual impairment. 5. Diet and physical activities 6. Evidence for depression or mood disorder  Subjective:   Review of Systems:   Constitutional: Denies fever, malaise, fatigue, headache or abrupt weight changes.  HEENT: Denies eye pain, eye redness, ear pain, ringing in the ears, wax buildup, runny nose, nasal congestion, bloody nose, or sore throat. Respiratory: Denies difficulty breathing, shortness of breath, cough or sputum production.   Cardiovascular: Denies chest pain, chest tightness, palpitations or swelling in the hands or feet.  Gastrointestinal: Denies abdominal pain, bloating, constipation, diarrhea or blood in the stool.  GU: Denies urgency, frequency, pain with urination, burning sensation, blood in urine, odor or discharge. Musculoskeletal: Denies decrease in range of motion, difficulty with gait, muscle pain or joint pain and swelling.  Skin: Denies redness, rashes, lesions or ulcercations.  Neurological: Denies dizziness, difficulty with memory, difficulty with speech or problems with balance and coordination.  Psych: Denies anxiety, depression, SI/HI.  No other specific complaints in a complete review of systems (except as listed in HPI above).  Objective:  PE:   BP 139/88 (BP Location: Left Arm, Patient Position: Sitting, Cuff Size: Large)    Pulse 92    Temp (!) 97.1 F (36.2 C) (Temporal)    Resp 18    Ht 5\' 6"  (1.676 m)    Wt 211 lb 9.6 oz (96 kg)    SpO2 98%    BMI 34.15 kg/m  Wt Readings from Last 3 Encounters:  07/06/21 211 lb 9.6 oz (96 kg)  07/02/21 210 lb (95.3 kg)  01/09/21 220 lb 9.6 oz  (100.1 kg)     BMET    Component Value Date/Time   NA 135 07/02/2021 0921   NA 140 03/25/2015 0802   NA 137 05/19/2013 0254   K 4.6 07/02/2021 0921   K 3.9 05/19/2013 0254   CL 103 07/02/2021 0921   CL 107 05/19/2013 0254   CO2 24 07/02/2021 0921   CO2 25 05/19/2013 0254   GLUCOSE 344 (H) 07/02/2021 0921   GLUCOSE 124 (  H) 05/19/2013 0254   BUN 17 07/02/2021 0921   BUN 15 03/25/2015 0802   BUN 14 05/19/2013 0254   CREATININE 0.83 07/02/2021 0921   CALCIUM 9.0 07/02/2021 0921   CALCIUM 8.5 05/19/2013 0254   GFRNONAA >60 12/09/2018 0500   GFRNONAA 96 11/03/2017 0821   GFRAA >60 12/09/2018 0500   GFRAA 111 11/03/2017 0821    Lipid Panel     Component Value Date/Time   CHOL 305 (H) 07/02/2021 0921   CHOL 243 (H) 03/25/2015 0802   TRIG 353 (H) 07/02/2021 0921   HDL 42 07/02/2021 0921   HDL 38 (L) 03/25/2015 0802   CHOLHDL 7.3 (H) 07/02/2021 0921   VLDL 48 (H) 10/01/2016 1140   LDLCALC 203 (H) 07/02/2021 0921    CBC    Component Value Date/Time   WBC 6.3 07/02/2021 0921   RBC 5.82 (H) 07/02/2021 0921   HGB 18.5 (H) 07/02/2021 0921   HGB 16.0 03/25/2015 0802   HCT 53.7 (H) 07/02/2021 0921   HCT 45.1 03/25/2015 0802   PLT 178 07/02/2021 0921   PLT 190 03/25/2015 0802   MCV 92.3 07/02/2021 0921   MCV 90 03/25/2015 0802   MCV 89 05/19/2013 0254   MCH 31.8 07/02/2021 0921   MCHC 34.5 07/02/2021 0921   RDW 12.3 07/02/2021 0921   RDW 13.4 03/25/2015 0802   RDW 13.2 05/19/2013 0254   LYMPHSABS 2.2 12/09/2018 0500   LYMPHSABS 1.6 03/25/2015 0802   LYMPHSABS 1.4 05/19/2013 0254   MONOABS 0.8 12/09/2018 0500   MONOABS 1.2 (H) 05/19/2013 0254   EOSABS 0.1 12/09/2018 0500   EOSABS 0.2 03/25/2015 0802   EOSABS 0.1 05/19/2013 0254   BASOSABS 0.0 12/09/2018 0500   BASOSABS 0.0 03/25/2015 0802   BASOSABS 0.1 05/19/2013 0254    Hgb A1C Lab Results  Component Value Date   HGBA1C 10.5 (H) 07/02/2021      Assessment and Plan:   Medicare Annual Wellness  Visit:  Diet: DM if diabetic Physical activity: Exercise for 30 minutes, 5 times a week  Depression/mood screen: Negative,  [place] Office Visit from 07/06/2021 in [place]  PHQ-9 Total Score 3      Hearing: Intact to whispered voice Visual acuity: Grossly normal, performs annual eye exam  ADLs: Capable Fall risk: None Home safety: Good Cognitive evaluation:  6CIT Screen 07/06/2021  What Year? 0 points  What month? 0 points  What time? 0 points  Count back from 20 0 points  Months in reverse 0 points  Repeat phrase 0 points  Total Score 0     EOL planning: No Adv directives, full code/ I agree  Nurse's Notes: Pt notified about his recent lab results. He agreed to start on oral diabetes medications. He also stated that he is currently taking his Atorvastatin and Zetia daily as directed. He agreed with working on quitting smoking, made aware of his elevated HCT and hemoglobin.    Next appointment: F/u appointment schedule for Diabetes and hyperlipidemia on 10/05/2021.   Wilson Singer, CMA

## 2021-07-08 MED ORDER — GLIPIZIDE 10 MG PO TABS: ORAL_TABLET | ORAL | 0 refills | Status: DC

## 2021-07-08 MED ORDER — ROSUVASTATIN CALCIUM 10 MG PO TABS: 10.0000 mg | ORAL_TABLET | Freq: Every day | ORAL | 0 refills | Status: DC

## 2021-07-08 NOTE — Addendum Note (Signed)
Addended by: Jearld Fenton on: 07/08/2021 06:00 PM   Modules accepted: Orders

## 2021-07-15 ENCOUNTER — Other Ambulatory Visit: Payer: Self-pay | Admitting: Internal Medicine

## 2021-07-15 NOTE — Telephone Encounter (Signed)
Requested Prescriptions  Pending Prescriptions Disp Refills   traZODone (DESYREL) 50 MG tablet [Pharmacy Med Name: TRAZODONE 50 MG TABLET] 90 tablet 1    Sig: TAKE 1 TABLET BY MOUTH EVERY DAY AT BEDTIME AS NEEDED FOR SLEEP     Psychiatry: Antidepressants - Serotonin Modulator Passed - 07/15/2021  1:42 AM      Passed - Valid encounter within last 6 months    Recent Outpatient Visits          1 week ago Encounter for general adult medical examination with abnormal findings   [facility] [place], Coralie Keens, NP   6 months ago Chronic right shoulder pain   [facility] [place], Coralie Keens, NP   6 months ago Diet-controlled type 2 diabetes mellitus [facility])   [facility] [place], Coralie Keens, NP   7 months ago Acute left-sided low back pain without sciatica   [facility] [place], Coralie Keens, NP   9 months ago Lower urinary tract symptoms (LUTS)   [facility] Olin Hauser, DO      Future Appointments            In 2 months Baity, Coralie Keens, NP [facility]

## 2021-07-16 NOTE — Addendum Note (Signed)
Addended by: Wilson Singer on: 07/16/2021 04:05 PM   Modules accepted: Level of Service

## 2021-07-20 ENCOUNTER — Other Ambulatory Visit: Payer: Self-pay | Admitting: *Deleted

## 2021-07-20 DIAGNOSIS — Z87891 Personal history of nicotine dependence: Secondary | ICD-10-CM

## 2021-07-20 DIAGNOSIS — F17210 Nicotine dependence, cigarettes, uncomplicated: Secondary | ICD-10-CM

## 2021-08-03 ENCOUNTER — Encounter: Payer: Self-pay | Admitting: Acute Care

## 2021-08-03 ENCOUNTER — Ambulatory Visit
Admission: RE | Admit: 2021-08-03 | Discharge: 2021-08-03 | Disposition: A | Discharge status: Home / Self Care | Payer: Medicare Other | Source: Ambulatory Visit | Attending: Acute Care | Admitting: Acute Care

## 2021-08-03 ENCOUNTER — Ambulatory Visit (INDEPENDENT_AMBULATORY_CARE_PROVIDER_SITE_OTHER): Payer: Medicare Other | Admitting: Acute Care

## 2021-08-03 ENCOUNTER — Other Ambulatory Visit: Payer: Self-pay

## 2021-08-03 DIAGNOSIS — Z87891 Personal history of nicotine dependence: Secondary | ICD-10-CM | POA: Diagnosis not present

## 2021-08-03 DIAGNOSIS — F17210 Nicotine dependence, cigarettes, uncomplicated: Secondary | ICD-10-CM | POA: Insufficient documentation

## 2021-08-03 NOTE — Patient Instructions (Signed)
Thank you for participating in the [facility]. °It was our pleasure to meet you today. °We will call you with the results of your scan within the next few days. °Your scan will be assigned a Lung RADS category score by the physicians reading the scans.  °This Lung RADS score determines follow up scanning.  °See below for description of categories, and follow up screening recommendations. °We will be in touch to schedule your follow up screening annually or based on recommendations of our providers. °We will fax a copy of your scan results to your Primary Care Physician, or the physician who referred you to the program, to ensure they have the results. °Please call the office if you have any questions or concerns regarding your scanning experience or results.  °Our office number is [phone]. °Please speak with Denise Phelps, RN. She is our Lung Cancer Screening RN. °If she is unavailable when you call, please have the office staff send her a message. She will return your call at her earliest convenience. °Remember, if your scan is normal, we will scan you annually as long as you continue to meet the criteria for the program. (Age 55-77, Current smoker or smoker who has quit within the last 15 years). °If you are a smoker, remember, quitting is the single most powerful action that you can take to decrease your risk of lung cancer and other pulmonary, breathing related problems. °We know quitting is hard, and we are here to help.  °Please let us know if there is anything we can do to help you meet your goal of quitting. °If you are a former smoker, congratulations. We are proud of you! Remain smoke free! °Remember you can refer friends or family members through the number above.  °We will screen them to make sure they meet criteria for the program. °Thank you for helping us take better care of you by participating in Lung Screening. ° °You can receive free nicotine replacement therapy  ( patches, gum or mints) by calling 1-800-QUIT NOW. Please call so we can get you on the path to becoming  a non-smoker. I know it is hard, but you can do this! ° °Lung RADS Categories: ° °Lung RADS 1: no nodules or definitely non-concerning nodules.  °Recommendation is for a repeat annual scan in 12 months. ° °Lung RADS 2:  nodules that are non-concerning in appearance and behavior with a very low likelihood of becoming an active cancer. °Recommendation is for a repeat annual scan in 12 months. ° °Lung RADS 3: nodules that are probably non-concerning , includes nodules with a low likelihood of becoming an active cancer.  Recommendation is for a 6-month repeat screening scan. Often noted after an upper respiratory illness. We will be in touch to make sure you have no questions, and to schedule your 6-month scan. ° °Lung RADS 4 A: nodules with concerning findings, recommendation is most often for a follow up scan in 3 months or additional testing based on our provider's assessment of the scan. We will be in touch to make sure you have no questions and to schedule the recommended 3 month follow up scan. ° °Lung RADS 4 B:  indicates findings that are concerning. We will be in touch with you to schedule additional diagnostic testing based on our provider's  assessment of the scan. ° °Hypnosis for smoking cessation  °[employer] °[phone] ° °Acupuncture for smoking cessation  °[facility] °[phone]  °

## 2021-08-03 NOTE — Progress Notes (Signed)
Virtual Visit via Telephone Note ? ?I connected with Craig Bruce on 04/07/21 at  2:00 PM EST by telephone and verified that I am speaking with the correct person using two identifiers. ? ?Location: ?Patient: Home ?Provider: Working from home ?  ?I discussed the limitations, risks, security and privacy concerns of performing an evaluation and management service by telephone and the availability of in person appointments. I also discussed with the patient that there may be a patient responsible charge related to this service. The patient expressed understanding and agreed to proceed. ? ?Shared Decision Making Visit Lung Cancer Screening Program ?([phone]) ? ? ?Eligibility: ?Age 66 y.o. ?Pack Years Smoking History Calculation 24 ?(# packs/per year x # years smoked) ?Recent History of coughing up blood  no ?Unexplained weight loss? no ?( >Than 15 pounds within the last 6 months ) ?Prior History Lung / other cancer no ?(Diagnosis within the last 5 years already requiring surveillance chest CT Scans). ?Smoking Status Current Smoker ?Former Smokers: Years since quit: NA ? Quit Date: NA ? ?Visit Components: ?Discussion included one or more decision making aids. yes ?Discussion included risk/benefits of screening. yes ?Discussion included potential follow up diagnostic testing for abnormal scans. yes ?Discussion included meaning and risk of over diagnosis. yes ?Discussion included meaning and risk of False Positives. yes ?Discussion included meaning of total radiation exposure. yes ? ?Counseling Included: ?Importance of adherence to annual lung cancer LDCT screening. yes ?Impact of comorbidities on ability to participate in the program. yes ?Ability and willingness to under diagnostic treatment. yes ? ?Smoking Cessation Counseling: ?Current Smokers:  ?Discussed importance of smoking cessation. yes ?Information about tobacco cessation classes and interventions provided to patient. yes ?Patient provided with "ticket" for  LDCT Scan. yes ?Symptomatic Patient. yes ? Counseling(Intermediate counseling: > three minutes) 99406 ?Diagnosis Code: Tobacco Use Z72.0 ?Asymptomatic Patient no ? Counseling NA ?Former Smokers:  ?Discussed the importance of maintaining cigarette abstinence. yes ?Diagnosis Code: Personal History of Nicotine Dependence. Y19.509 ?Information about tobacco cessation classes and interventions provided to patient. Yes ?Patient provided with "ticket" for LDCT Scan. yes ?Written Order for Lung Cancer Screening with LDCT placed in Epic. Yes ?(CT Chest Lung Cancer Screening Low Dose W/O CM) [id number] ?Z12.2-Screening of respiratory organs ?Z87.891-Personal history of nicotine dependence ? ? ?I spent 25 minutes of face to face time with him discussing the risks and benefits of lung cancer screening. We viewed a power point together that explained in detail the above noted topics. We took the time to pause the power point at intervals to allow for questions to be asked and answered to ensure understanding. We discussed that he had taken the single most powerful action possible to decrease his risk of developing lung cancer when he quit smoking. I counseled him to remain smoke free, and to contact me if he ever had the desire to smoke again so that I can provide resources and tools to help support the effort to remain smoke free. We discussed the time and location of the scan, and that either  Doroteo Glassman RN or I will call with the results within  24-48 hours of receiving them. He has my card and contact information in the event he needs to speak with me, in addition to a copy of the power point we reviewed as a resource. He verbalized understanding of all of the above and had no further questions upon leaving the office.  ? ? ? ?I explained to the patient that there has been a  high incidence of coronary artery disease noted on these exams. I explained that this is a non-gated exam therefore degree or severity cannot be  determined. This patient is on statin therapy. I have asked the patient to follow-up with their PCP regarding any incidental finding of coronary artery disease and management with diet or medication as they feel is clinically indicated. The patient verbalized understanding of the above and had no further questions. ? ? ?I spent 3 minutes counseling on smoking cessation and the health risks of continued tobacco abuse  ? ? ?Dartha Rozzell D. Harris, NP-C ?[facility] ?Personal contact information can be found on Amion  ?08/03/2021, 9:55 AM ? ? ? ? ? ? ? ? ? ?

## 2021-08-05 ENCOUNTER — Telehealth: Payer: Self-pay | Admitting: Acute Care

## 2021-08-05 NOTE — Telephone Encounter (Signed)
I have called the patient with the results of his low-dose screening CT.  I explained that his scan was read as a lung RADS 3, probably benign findings.  Recommendation per radiology is for a 50-monthfollow-up low-dose CT to reevaluate a solid pulmonary nodule in the right upper lobe that measures 6.3 mm.  Patient is in agreement with this plan. ? ?Additionally there was an incidental finding of aortic valvular calcifications which may be seen in the setting of aortic sclerosis or stenosis.  Radiology recommends echocardiogram for further follow-up. ?RRollene Fare please order if you feel that this is clinically appropriate. ? 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  Recommendation is for semiannual imaging and follow-up CTA or MRA and referral to cardiothoracic surgery. ? ?Craig Bruce when I spoke to Mr. SCaggianoabout this I asked him if he wanted me to go ahead and make the referral to thoracic surgery and he specified that he wanted to talk to you about this first.  It looks like you have an appointment with him in May, however perhaps you want to discuss with him before then.  This is his first scan therefore I do not know if this is a stable aneurysm.  He will be getting a repeat CT in 6 months and at that time we can reevaluate, compare. ?I saw he does has hypertension listed as one of his medical problems and I did express to him that maintaining blood pressure control was very important in the setting of an enlarged thoracic aorta. ? ?Please do not hesitate to call me if you have any questions about this. ? ?Denise please place order for 613-monthollow-up low-dose CT to reevaluate the pulmonary nodule, I have included the results of the low-dose CT for nurse practitioner Baity in this note. ? ?Lung-RADS 3S, probably benign findings. Short-term follow-up in 6 ?months is recommended with repeat low-dose chest CT without contrast ?(please use the following  order, CT CHEST LCS NODULE FOLLOW-UP W/O ?CM). S modifier for aortic valvular calcifications and aneurysm of ?the ascending thoracic aorta. ?2. Aortic valvular calcifications, which can be seen in the setting ?of aortic sclerosis or stenosis recommend echocardiography for ?further evaluation. ?3. Aneurysm of the ascending thoracic aorta, measuring up to 4.8 cm. ?Ascending thoracic aortic aneurysm. Recommend semi-annual imaging ?followup by CTA or MRA and referral to cardiothoracic surgery if not ?already obtained. This recommendation follows 2010 ?ACCF/AHA/AATS/ACR/ASA/SCA/SCAI/SIR/STS/SVM Guidelines for the ?Diagnosis and Management of Patients With Thoracic Aortic Disease. ?Circulation. 2010; 121: : Z610-R604Aortic aneurysm NOS (ICD10-I71.9) ?4. Aortic Atherosclerosis (ICD10-I70.0) and Emphysema (ICD10-J43.9). ?  ?  ?

## 2021-08-06 ENCOUNTER — Other Ambulatory Visit: Payer: Self-pay

## 2021-08-06 NOTE — Telephone Encounter (Signed)
Noted, will discuss with patient.

## 2021-08-06 NOTE — Telephone Encounter (Signed)
Order placed for 6 month follow up CT ?

## 2021-09-26 ENCOUNTER — Other Ambulatory Visit: Payer: Self-pay | Admitting: Internal Medicine

## 2021-09-28 NOTE — Telephone Encounter (Signed)
Requested Prescriptions  ?Pending Prescriptions Disp Refills  ?? rosuvastatin (CRESTOR) 10 MG tablet [Pharmacy Med Name: ROSUVASTATIN CALCIUM 10 MG TAB] 90 tablet 0  ?  Sig: TAKE 1 TABLET BY MOUTH EVERY DAY  ?  ? Cardiovascular:  Antilipid - Statins 2 Failed - 09/26/2021 10:22 AM  ?  ?  Failed - Lipid Panel in normal range within the last 12 months  ?  Cholesterol, Total  ?Date Value Ref Range Status  ?03/25/2015 243 (H) 100 - 199 mg/dL Final  ? ?Cholesterol  ?Date Value Ref Range Status  ?07/02/2021 305 (H) <200 mg/dL Final  ? ?LDL Cholesterol (Calc)  ?Date Value Ref Range Status  ?07/02/2021 203 (H) mg/dL (calc) Final  ?  Comment:  ?  LDL-C levels > or = 190 mg/dL may indicate familial  ?hypercholesterolemia (FH). Clinical assessment and  ?measurement of blood lipid levels should be  ?considered for all first degree relatives of  ?patients with an FH diagnosis.  ?For questions about testing for familial ?hypercholesterolemia, please call [facility] ?Client Services at [email or web address]. ?Duncan Dull, et al. J National Lipid Association  ?Recommendations for Patient-Centered Management of  ?Dyslipidemia: Part 1 Journal of Clinical Lipidology  ?2015;9(2), 129-169. ?Reference range: <100 ?Marland Kitchen ?Desirable range <100 mg/dL for primary prevention;   ?<70 mg/dL for patients with CHD or diabetic patients  ?with > or = 2 CHD risk factors. ?. ?LDL-C is now calculated using the Martin-Hopkins  ?calculation, which is a validated novel method providing  ?better accuracy than the Friedewald equation in the  ?estimation of LDL-C.  ?Cresenciano Genre et al. Annamaria Helling. 0037;048(88): 2061-2068  ?(http://education.Quest[employer]ostics.com/faq/FAQ164) ?  ? ?HDL  ?Date Value Ref Range Status  ?07/02/2021 42 > OR = 40 mg/dL Final  ?03/25/2015 38 (L) >39 mg/dL Final  ?  Comment:  ?  According to ATP-III Guidelines, HDL-C >59 mg/dL is considered a ?negative risk factor for CHD. ?  ? ?Triglycerides  ?Date Value Ref Range Status  ?07/02/2021 353 (H) <150 mg/dL  Final  ?  Comment:  ?  . ?If a non-fasting specimen was collected, consider ?repeat triglyceride testing on a fasting specimen ?if clinically indicated.  ?Garald Balding al. J. of Clin. Lipidol. 9169;4:503-888. ?. ?  ? ?  ?  ?  Passed - Cr in normal range and within 360 days  ?  Creat  ?Date Value Ref Range Status  ?07/02/2021 0.83 0.70 - 1.35 mg/dL Final  ? ?Creatinine, Urine  ?Date Value Ref Range Status  ?07/02/2021 56 20 - 320 mg/dL Final  ?   ?  ?  Passed - Patient is not pregnant  ?  ?  Passed - Valid encounter within last 12 months  ?  Recent Outpatient Visits   ?      ? 2 months ago Encounter for general adult medical examination with abnormal findings  ? [facility] [place], [place] W, NP  ? 8 months ago Chronic right shoulder pain  ? [facility] [place], [place], NP  ? 9 months ago Diet-controlled type 2 diabetes mellitus [facility])  ? [facility] [place], [place], NP  ? 10 months ago Acute left-sided low back pain without sciatica  ? [facility] [place], [place], NP  ? 1 year ago Lower urinary tract symptoms (LUTS)  ? [place], DO  ?  ?  ?Future Appointments   ?        ? In  1 week Jearld Fenton, NP [place]  ?  ? ?  ?  ?  ?? glipiZIDE (GLUCOTROL) 10 MG tablet [Pharmacy Med Name: GLIPIZIDE 10 MG TABLET] 180 tablet 0  ?  Sig: TAKE 0.5 TABLETS BY MOUTH 2 TIMES DAILY BEFORE A MEAL FOR 10 DAYS, THEN 1 TABLET 2 TIMES DAILY  ?  ? Endocrinology:  Diabetes - Sulfonylureas Failed - 09/26/2021 10:22 AM  ?  ?  Failed - HBA1C is between 0 and 7.9 and within 180 days  ?  Hgb A1c MFr Bld  ?Date Value Ref Range Status  ?07/02/2021 10.5 (H) <5.7 % of total Hgb Final  ?  Comment:  ?  For someone without known diabetes, a hemoglobin A1c ?value of 6.5% or greater indicates that they may have  ?diabetes and this should be confirmed with a follow-up  ?test. ?. ?For someone with known diabetes, a value <7%  indicates  ?that their diabetes is well controlled and a value  ?greater than or equal to 7% indicates suboptimal  ?control. A1c targets should be individualized based on  ?duration of diabetes, age, comorbid conditions, and  ?other considerations. ?. ?Currently, no consensus exists regarding use of ?hemoglobin A1c for diagnosis of diabetes for children. ?. ?  ?   ?  ?  Passed - Cr in normal range and within 360 days  ?  Creat  ?Date Value Ref Range Status  ?07/02/2021 0.83 0.70 - 1.35 mg/dL Final  ? ?Creatinine, Urine  ?Date Value Ref Range Status  ?07/02/2021 56 20 - 320 mg/dL Final  ?   ?  ?  Passed - Valid encounter within last 6 months  ?  Recent Outpatient Visits   ?      ? 2 months ago Encounter for general adult medical examination with abnormal findings  ? [facility] [place], [place], NP  ? 8 months ago Chronic right shoulder pain  ? [facility] [place], [place], NP  ? 9 months ago Diet-controlled type 2 diabetes mellitus [facility])  ? [facility] [place], [place], NP  ? 10 months ago Acute left-sided low back pain without sciatica  ? [facility] [place], [place] W, NP  ? 1 year ago Lower urinary tract symptoms (LUTS)  ? [place], DO  ?  ?  ?Future Appointments   ?        ? In 1 week Baity, Coralie Keens, NP [facility] [place]  ?  ? ?  ?  ?  ? ? ?

## 2021-10-05 ENCOUNTER — Ambulatory Visit: Payer: Medicare Other | Admitting: Internal Medicine

## 2021-12-16 ENCOUNTER — Other Ambulatory Visit: Payer: Self-pay | Admitting: Internal Medicine

## 2021-12-16 DIAGNOSIS — R39198 Other difficulties with micturition: Secondary | ICD-10-CM

## 2021-12-16 DIAGNOSIS — R399 Unspecified symptoms and signs involving the genitourinary system: Secondary | ICD-10-CM

## 2021-12-16 NOTE — Telephone Encounter (Signed)
Medication refill: traZODone (DESYREL) 50 MG tablet [[id number]] glipiZIDE (GLUCOTROL) 10 MG tablet [[id number]] rosuvastatin (CRESTOR) 10 MG tablet [[id number]] meloxicam (MOBIC) 15 MG tablet [[id number]] Prednisone '10mg'$   tamsulosin (FLOMAX) 0.4 MG CAPS capsule [[id number]]   Pharmacy:  [facility] - [place], [address] Phone:  [phone]  Fax:  [phone]        Last appointment: 10/05/21

## 2021-12-17 MED ORDER — MELOXICAM 15 MG PO TABS: 15.0000 mg | ORAL_TABLET | Freq: Every day | ORAL | 0 refills | Status: DC

## 2021-12-17 MED ORDER — ROSUVASTATIN CALCIUM 10 MG PO TABS: 10.0000 mg | ORAL_TABLET | Freq: Every day | ORAL | 0 refills | Status: DC

## 2021-12-17 MED ORDER — TAMSULOSIN HCL 0.4 MG PO CAPS: 0.4000 mg | ORAL_CAPSULE | Freq: Every day | ORAL | 0 refills | Status: AC

## 2021-12-17 MED ORDER — TRAZODONE HCL 50 MG PO TABS: ORAL_TABLET | ORAL | 0 refills | Status: DC

## 2021-12-17 MED ORDER — GLIPIZIDE 10 MG PO TABS
ORAL_TABLET | ORAL | 0 refills | Status: DC
Start: 2021-12-17 — End: 2021-12-28

## 2021-12-17 NOTE — Telephone Encounter (Signed)
Requested medication (s) are due for refill todayyes  Requested medication (s) are on the active medication list: yes  Last refill:  02/08/20  Future visit scheduled: no  Notes to clinic:  Unable to refill per protocol, last refill by another provider. Historical provider, routing for approval.     Requested Prescriptions  Pending Prescriptions Disp Refills   meloxicam (MOBIC) 15 MG tablet       Analgesics:  COX2 Inhibitors Failed - 12/16/2021 11:25 AM      Failed - Manual Review: Labs are only required if the patient has taken medication for more than 8 weeks.      Failed - HGB in normal range and within 360 days    Hemoglobin  Date Value Ref Range Status  07/02/2021 18.5 (H) 13.2 - 17.1 g/dL Final  03/25/2015 16.0 12.6 - 17.7 g/dL Final         Failed - HCT in normal range and within 360 days    HCT  Date Value Ref Range Status  07/02/2021 53.7 (H) 38.5 - 50.0 % Final   Hematocrit  Date Value Ref Range Status  03/25/2015 45.1 37.5 - 51.0 % Final         Passed - Cr in normal range and within 360 days    Creat  Date Value Ref Range Status  07/02/2021 0.83 0.70 - 1.35 mg/dL Final   Creatinine, Urine  Date Value Ref Range Status  07/02/2021 56 20 - 320 mg/dL Final         Passed - AST in normal range and within 360 days    AST  Date Value Ref Range Status  07/02/2021 20 10 - 35 U/L Final   SGOT(AST)  Date Value Ref Range Status  05/19/2013 33 15 - 37 Unit/L Final         Passed - ALT in normal range and within 360 days    ALT  Date Value Ref Range Status  07/02/2021 28 9 - 46 U/L Final   SGPT (ALT)  Date Value Ref Range Status  05/19/2013 33 12 - 78 U/L Final         Passed - eGFR is 30 or above and within 360 days    GFR, Est African American  Date Value Ref Range Status  11/03/2017 111 > OR = 60 mL/min/1.31m Final   GFR calc Af Amer  Date Value Ref Range Status  12/09/2018 >60 >60 mL/min Final   GFR, Est Non African American  Date Value Ref  Range Status  11/03/2017 96 > OR = 60 mL/min/1.734mFinal   GFR calc non Af Amer  Date Value Ref Range Status  12/09/2018 >60 >60 mL/min Final   eGFR  Date Value Ref Range Status  07/02/2021 97 > OR = 60 mL/min/1.7327minal    Comment:    The eGFR is based on the CKD-EPI 2021 equation. To calculate  the new eGFR from a previous Creatinine or Cystatin C result, go to https://www.kidney.org/professionals/ kdoqi/gfr%5Fcalculator          Passed - Patient is not pregnant      Passed - Valid encounter within last 12 months    Recent Outpatient Visits           5 months ago Encounter for general adult medical examination with abnormal findings   [facility]   11 months ago Chronic right shoulder pain   [facility]  [facility]   11 months ago Diet-controlled type 2 diabetes mellitus ([place])   [facility] [place], Coralie Keens, NP   1 year ago Acute left-sided low back pain without sciatica   [facility] [place], Coralie Keens, NP   1 year ago Lower urinary tract symptoms (LUTS)   [place], DO              Signed Prescriptions Disp Refills   traZODone (DESYREL) 50 MG tablet 90 tablet 0    Sig: TAKE 1 TABLET BY MOUTH EVERY DAY AT BEDTIME AS NEEDED FOR SLEEP     Psychiatry: Antidepressants - Serotonin Modulator Passed - 12/16/2021 11:25 AM      Passed - Valid encounter within last 6 months    Recent Outpatient Visits           5 months ago Encounter for general adult medical examination with abnormal findings   [facility] [place], Coralie Keens, NP   11 months ago Chronic right shoulder pain   [facility] [place], Coralie Keens, NP   11 months ago Diet-controlled type 2 diabetes mellitus [facility])   [facility] [place], Coralie Keens, NP   1 year ago Acute left-sided low back pain without sciatica   [facility]  [place], Coralie Keens, NP   1 year ago Lower urinary tract symptoms (LUTS)   [place], DO               glipiZIDE (GLUCOTROL) 10 MG tablet 180 tablet 0    Sig: TAKE 0.5 TABLETS BY MOUTH 2 TIMES DAILY BEFORE A MEAL FOR 10 DAYS, THEN 1 TABLET 2 TIMES DAILY     Endocrinology:  Diabetes - Sulfonylureas Failed - 12/16/2021 11:25 AM      Failed - HBA1C is between 0 and 7.9 and within 180 days    Hgb A1c MFr Bld  Date Value Ref Range Status  07/02/2021 10.5 (H) <5.7 % of total Hgb Final    Comment:    For someone without known diabetes, a hemoglobin A1c value of 6.5% or greater indicates that they may have  diabetes and this should be confirmed with a follow-up  test. . For someone with known diabetes, a value <7% indicates  that their diabetes is well controlled and a value  greater than or equal to 7% indicates suboptimal  control. A1c targets should be individualized based on  duration of diabetes, age, comorbid conditions, and  other considerations. . Currently, no consensus exists regarding use of hemoglobin A1c for diagnosis of diabetes for children. .          Passed - Cr in normal range and within 360 days    Creat  Date Value Ref Range Status  07/02/2021 0.83 0.70 - 1.35 mg/dL Final   Creatinine, Urine  Date Value Ref Range Status  07/02/2021 56 20 - 320 mg/dL Final         Passed - Valid encounter within last 6 months    Recent Outpatient Visits           5 months ago Encounter for general adult medical examination with abnormal findings   [facility] [place], Coralie Keens, NP   11 months ago Chronic right shoulder pain   [facility] [place], [place] W, NP   11 months ago Diet-controlled type 2 diabetes mellitus ([place])  [facility] [place], [place] W, NP   1 year ago Acute left-sided low back pain without sciatica   [facility] [place], [place], NP   1 year ago Lower  urinary tract symptoms (LUTS)   [place], DO               rosuvastatin (CRESTOR) 10 MG tablet 90 tablet 0    Sig: Take 1 tablet (10 mg total) by mouth daily.     Cardiovascular:  Antilipid - Statins 2 Failed - 12/16/2021 11:25 AM      Failed - Lipid Panel in normal range within the last 12 months    Cholesterol, Total  Date Value Ref Range Status  03/25/2015 243 (H) 100 - 199 mg/dL Final   Cholesterol  Date Value Ref Range Status  07/02/2021 305 (H) <200 mg/dL Final   LDL Cholesterol (Calc)  Date Value Ref Range Status  07/02/2021 203 (H) mg/dL (calc) Final    Comment:    LDL-C levels > or = 190 mg/dL may indicate familial  hypercholesterolemia (FH). Clinical assessment and  measurement of blood lipid levels should be  considered for all first degree relatives of  patients with an FH diagnosis.  For questions about testing for familial hypercholesterolemia, please call Insurance risk surveyor at [email or web address]. Duncan Dull, et al. J National Lipid Association  Recommendations for Patient-Centered Management of  Dyslipidemia: Part 1 Journal of Clinical Lipidology  2015;9(2), 129-169. Reference range: <100 . Desirable range <100 mg/dL for primary prevention;   <70 mg/dL for patients with CHD or diabetic patients  with > or = 2 CHD risk factors. Marland Kitchen LDL-C is now calculated using the Martin-Hopkins  calculation, which is a validated novel method providing  better accuracy than the Friedewald equation in the  estimation of LDL-C.  Cresenciano Genre et al. Annamaria Helling. 3244;010(27): 2061-2068  (http://education.Quest[employer].com/faq/FAQ164)    HDL  Date Value Ref Range Status  07/02/2021 42 > OR = 40 mg/dL Final  03/25/2015 38 (L) >39 mg/dL Final    Comment:    According to ATP-III Guidelines, HDL-C >59 mg/dL is considered a negative risk factor for CHD.    Triglycerides  Date Value Ref Range Status  07/02/2021 353 (H)  <150 mg/dL Final    Comment:    . If a non-fasting specimen was collected, consider repeat triglyceride testing on a fasting specimen if clinically indicated.  Yates Decamp et al. J. of Clin. Lipidol. 2536;6:440-347. Marland Kitchen          Passed - Cr in normal range and within 360 days    Creat  Date Value Ref Range Status  07/02/2021 0.83 0.70 - 1.35 mg/dL Final   Creatinine, Urine  Date Value Ref Range Status  07/02/2021 56 20 - 320 mg/dL Final         Passed - Patient is not pregnant      Passed - Valid encounter within last 12 months    Recent Outpatient Visits           5 months ago Encounter for general adult medical examination with abnormal findings   [facility] [place], Coralie Keens, NP   11 months ago Chronic right shoulder pain   [facility] [place], Coralie Keens, NP   11 months ago Diet-controlled type 2 diabetes mellitus [facility])   [facility], Coralie Keens, NP   1 year ago Acute left-sided low back pain  without sciatica   [facility] [place], [place], NP   1 year ago Lower urinary tract symptoms (LUTS)   [place], DO               tamsulosin (FLOMAX) 0.4 MG CAPS capsule 90 capsule 0    Sig: Take 1 capsule (0.4 mg total) by mouth daily after breakfast.     Urology: Alpha-Adrenergic Blocker Passed - 12/16/2021 11:25 AM      Passed - PSA in normal range and within 360 days    PSA  Date Value Ref Range Status  07/02/2021 3.35 < OR = 4.00 ng/mL Final    Comment:    The total PSA value from this assay system is  standardized against the WHO standard. The test  result will be approximately 20% lower when compared  to the equimolar-standardized total PSA (Beckman  Coulter). Comparison of serial PSA results should be  interpreted with this fact in mind. . This test was performed using the Siemens  chemiluminescent method. Values obtained from  different assay methods  cannot be used interchangeably. PSA levels, regardless of value, should not be interpreted as absolute evidence of the presence or absence of disease.          Passed - Last BP in normal range    BP Readings from Last 1 Encounters:  07/06/21 139/88         Passed - Valid encounter within last 12 months    Recent Outpatient Visits           5 months ago Encounter for general adult medical examination with abnormal findings   [facility] [place], Coralie Keens, NP   11 months ago Chronic right shoulder pain   [facility] [place], Coralie Keens, NP   11 months ago Diet-controlled type 2 diabetes mellitus [facility])   [facility] [place], Coralie Keens, NP   1 year ago Acute left-sided low back pain without sciatica   [facility] [place], Coralie Keens, NP   1 year ago Lower urinary tract symptoms (LUTS)   [place], DO

## 2021-12-17 NOTE — Telephone Encounter (Signed)
Requested Prescriptions  Pending Prescriptions Disp Refills  . traZODone (DESYREL) 50 MG tablet 90 tablet 0    Sig: TAKE 1 TABLET BY MOUTH EVERY DAY AT BEDTIME AS NEEDED FOR SLEEP     Psychiatry: Antidepressants - Serotonin Modulator Passed - 12/16/2021 11:25 AM      Passed - Valid encounter within last 6 months    Recent Outpatient Visits          5 months ago Encounter for general adult medical examination with abnormal findings   [facility] [place], Coralie Keens, NP   11 months ago Chronic right shoulder pain   [facility] [place], Coralie Keens, NP   11 months ago Diet-controlled type 2 diabetes mellitus [facility])   [facility] [place], Coralie Keens, NP   1 year ago Acute left-sided low back pain without sciatica   [facility] [place], Coralie Keens, NP   1 year ago Lower urinary tract symptoms (LUTS)   [facility], [place] J, DO             . glipiZIDE (GLUCOTROL) 10 MG tablet 180 tablet 0    Sig: TAKE 0.5 TABLETS BY MOUTH 2 TIMES DAILY BEFORE A MEAL FOR 10 DAYS, THEN 1 TABLET 2 TIMES DAILY     Endocrinology:  Diabetes - Sulfonylureas Failed - 12/16/2021 11:25 AM      Failed - HBA1C is between 0 and 7.9 and within 180 days    Hgb A1c MFr Bld  Date Value Ref Range Status  07/02/2021 10.5 (H) <5.7 % of total Hgb Final    Comment:    For someone without known diabetes, a hemoglobin A1c value of 6.5% or greater indicates that they may have  diabetes and this should be confirmed with a follow-up  test. . For someone with known diabetes, a value <7% indicates  that their diabetes is well controlled and a value  greater than or equal to 7% indicates suboptimal  control. A1c targets should be individualized based on  duration of diabetes, age, comorbid conditions, and  other considerations. . Currently, no consensus exists regarding use of hemoglobin A1c for diagnosis of diabetes for children. .           Passed - Cr in normal range and within 360 days    Creat  Date Value Ref Range Status  07/02/2021 0.83 0.70 - 1.35 mg/dL Final   Creatinine, Urine  Date Value Ref Range Status  07/02/2021 56 20 - 320 mg/dL Final         Passed - Valid encounter within last 6 months    Recent Outpatient Visits          5 months ago Encounter for general adult medical examination with abnormal findings   [facility] [place], Coralie Keens, NP   11 months ago Chronic right shoulder pain   [facility] [place], Coralie Keens, NP   11 months ago Diet-controlled type 2 diabetes mellitus [facility])   [facility] [place], Coralie Keens, NP   1 year ago Acute left-sided low back pain without sciatica   [facility] [place], Coralie Keens, NP   1 year ago Lower urinary tract symptoms (LUTS)   [place], DO             . rosuvastatin (CRESTOR) 10 MG tablet 90 tablet 0    Sig: Take 1  tablet (10 mg total) by mouth daily.     Cardiovascular:  Antilipid - Statins 2 Failed - 12/16/2021 11:25 AM      Failed - Lipid Panel in normal range within the last 12 months    Cholesterol, Total  Date Value Ref Range Status  03/25/2015 243 (H) 100 - 199 mg/dL Final   Cholesterol  Date Value Ref Range Status  07/02/2021 305 (H) <200 mg/dL Final   LDL Cholesterol (Calc)  Date Value Ref Range Status  07/02/2021 203 (H) mg/dL (calc) Final    Comment:    LDL-C levels > or = 190 mg/dL may indicate familial  hypercholesterolemia (FH). Clinical assessment and  measurement of blood lipid levels should be  considered for all first degree relatives of  patients with an FH diagnosis.  For questions about testing for familial hypercholesterolemia, please call Insurance risk surveyor at [email or web address]. Duncan Dull, et al. J National Lipid Association  Recommendations for Patient-Centered Management of  Dyslipidemia: Part 1 Journal of Clinical  Lipidology  2015;9(2), 129-169. Reference range: <100 . Desirable range <100 mg/dL for primary prevention;   <70 mg/dL for patients with CHD or diabetic patients  with > or = 2 CHD risk factors. Marland Kitchen LDL-C is now calculated using the Martin-Hopkins  calculation, which is a validated novel method providing  better accuracy than the Friedewald equation in the  estimation of LDL-C.  Cresenciano Genre et al. Annamaria Helling. 1610;960(45): 2061-2068  (http://education.[facility].com/faq/FAQ164)    HDL  Date Value Ref Range Status  07/02/2021 42 > OR = 40 mg/dL Final  03/25/2015 38 (L) >39 mg/dL Final    Comment:    According to ATP-III Guidelines, HDL-C >59 mg/dL is considered a negative risk factor for CHD.    Triglycerides  Date Value Ref Range Status  07/02/2021 353 (H) <150 mg/dL Final    Comment:    . If a non-fasting specimen was collected, consider repeat triglyceride testing on a fasting specimen if clinically indicated.  Yates Decamp et al. J. of Clin. Lipidol. 4098;1:191-478. Marland Kitchen          Passed - Cr in normal range and within 360 days    Creat  Date Value Ref Range Status  07/02/2021 0.83 0.70 - 1.35 mg/dL Final   Creatinine, Urine  Date Value Ref Range Status  07/02/2021 56 20 - 320 mg/dL Final         Passed - Patient is not pregnant      Passed - Valid encounter within last 12 months    Recent Outpatient Visits          5 months ago Encounter for general adult medical examination with abnormal findings   [facility] [place], Coralie Keens, NP   11 months ago Chronic right shoulder pain   [facility] [place], Coralie Keens, NP   11 months ago Diet-controlled type 2 diabetes mellitus [facility])   [facility] [place], Coralie Keens, NP   1 year ago Acute left-sided low back pain without sciatica   [facility] [place], Coralie Keens, NP   1 year ago Lower urinary tract symptoms (LUTS)   [facility], Devonne Doughty,  DO             . meloxicam (MOBIC) 15 MG tablet       Analgesics:  COX2 Inhibitors Failed - 12/16/2021 11:25 AM      Failed - Manual Review: Labs are only required if  the patient has taken medication for more than 8 weeks.      Failed - HGB in normal range and within 360 days    Hemoglobin  Date Value Ref Range Status  07/02/2021 18.5 (H) 13.2 - 17.1 g/dL Final  03/25/2015 16.0 12.6 - 17.7 g/dL Final         Failed - HCT in normal range and within 360 days    HCT  Date Value Ref Range Status  07/02/2021 53.7 (H) 38.5 - 50.0 % Final   Hematocrit  Date Value Ref Range Status  03/25/2015 45.1 37.5 - 51.0 % Final         Passed - Cr in normal range and within 360 days    Creat  Date Value Ref Range Status  07/02/2021 0.83 0.70 - 1.35 mg/dL Final   Creatinine, Urine  Date Value Ref Range Status  07/02/2021 56 20 - 320 mg/dL Final         Passed - AST in normal range and within 360 days    AST  Date Value Ref Range Status  07/02/2021 20 10 - 35 U/L Final   SGOT(AST)  Date Value Ref Range Status  05/19/2013 33 15 - 37 Unit/L Final         Passed - ALT in normal range and within 360 days    ALT  Date Value Ref Range Status  07/02/2021 28 9 - 46 U/L Final   SGPT (ALT)  Date Value Ref Range Status  05/19/2013 33 12 - 78 U/L Final         Passed - eGFR is 30 or above and within 360 days    GFR, Est African American  Date Value Ref Range Status  11/03/2017 111 > OR = 60 mL/min/1.62m Final   GFR calc Af Amer  Date Value Ref Range Status  12/09/2018 >60 >60 mL/min Final   GFR, Est Non African American  Date Value Ref Range Status  11/03/2017 96 > OR = 60 mL/min/1.790mFinal   GFR calc non Af Amer  Date Value Ref Range Status  12/09/2018 >60 >60 mL/min Final   eGFR  Date Value Ref Range Status  07/02/2021 97 > OR = 60 mL/min/1.7358minal    Comment:    The eGFR is based on the CKD-EPI 2021 equation. To calculate  the new eGFR from a previous  Creatinine or Cystatin C result, go to https://www.kidney.org/professionals/ kdoqi/gfr%5Fcalculator          Passed - Patient is not pregnant      Passed - Valid encounter within last 12 months    Recent Outpatient Visits          5 months ago Encounter for general adult medical examination with abnormal findings   [facility]   11 months ago Chronic right shoulder pain   [facility]   11 months ago Diet-controlled type 2 diabetes [facility]   1 year ago Acute left-sided low back pain without sciatica   [facility]   1 year ago Lower urinary tract symptoms (LUTS)   [facility]             . tamsulosin (FLOMAX) 0.4 MG CAPS capsule 90 capsule 0    Sig: Take 1 capsule (0.4 mg total) by  mouth daily after breakfast.     Urology: Alpha-Adrenergic Blocker Passed - 12/16/2021 11:25 AM      Passed - PSA in normal range and within 360 days    PSA  Date Value Ref Range Status  07/02/2021 3.35 < OR = 4.00 ng/mL Final    Comment:    The total PSA value from this assay system is  standardized against the WHO standard. The test  result will be approximately 20% lower when compared  to the equimolar-standardized total PSA (Beckman  Coulter). Comparison of serial PSA results should be  interpreted with this fact in mind. . This test was performed using the Siemens  chemiluminescent method. Values obtained from  different assay methods cannot be used interchangeably. PSA levels, regardless of value, should not be interpreted as absolute evidence of the presence or absence of disease.          Passed - Last BP in normal range    BP Readings from Last 1 Encounters:  07/06/21 139/88         Passed - Valid encounter within last 12 months    Recent Outpatient Visits          5  months ago Encounter for general adult medical examination with abnormal findings   [facility] [place], Coralie Keens, NP   11 months ago Chronic right shoulder pain   [facility] [place], Coralie Keens, NP   11 months ago Diet-controlled type 2 diabetes mellitus [facility])   [facility] [place], Coralie Keens, NP   1 year ago Acute left-sided low back pain without sciatica   [facility] [place], Coralie Keens, NP   1 year ago Lower urinary tract symptoms (LUTS)   [place], DO

## 2021-12-26 ENCOUNTER — Other Ambulatory Visit: Payer: Self-pay | Admitting: Internal Medicine

## 2021-12-28 NOTE — Telephone Encounter (Signed)
Change of pharmacy Requested Prescriptions  Pending Prescriptions Disp Refills  . rosuvastatin (CRESTOR) 10 MG tablet [Pharmacy Med Name: ROSUVASTATIN CALCIUM 10 MG TAB] 90 tablet 0    Sig: TAKE 1 TABLET BY MOUTH EVERY DAY     Cardiovascular:  Antilipid - Statins 2 Failed - 12/26/2021  9:31 AM      Failed - Lipid Panel in normal range within the last 12 months    Cholesterol, Total  Date Value Ref Range Status  03/25/2015 243 (H) 100 - 199 mg/dL Final   Cholesterol  Date Value Ref Range Status  07/02/2021 305 (H) <200 mg/dL Final   LDL Cholesterol (Calc)  Date Value Ref Range Status  07/02/2021 203 (H) mg/dL (calc) Final    Comment:    LDL-C levels > or = 190 mg/dL may indicate familial  hypercholesterolemia (FH). Clinical assessment and  measurement of blood lipid levels should be  considered for all first degree relatives of  patients with an FH diagnosis.  For questions about testing for familial hypercholesterolemia, please call Insurance risk surveyor at [email or web address]. Duncan Dull, et al. J National Lipid Association  Recommendations for Patient-Centered Management of  Dyslipidemia: Part 1 Journal of Clinical Lipidology  2015;9(2), 129-169. Reference range: <100 . Desirable range <100 mg/dL for primary prevention;   <70 mg/dL for patients with CHD or diabetic patients  with > or = 2 CHD risk factors. Marland Kitchen LDL-C is now calculated using the Martin-Hopkins  calculation, which is a validated novel method providing  better accuracy than the Friedewald equation in the  estimation of LDL-C.  Cresenciano Genre et al. Annamaria Helling. 0272;536(64): 2061-2068  (http://education.[facility].com/faq/FAQ164)    HDL  Date Value Ref Range Status  07/02/2021 42 > OR = 40 mg/dL Final  03/25/2015 38 (L) >39 mg/dL Final    Comment:    According to ATP-III Guidelines, HDL-C >59 mg/dL is considered a negative risk factor for CHD.    Triglycerides  Date Value Ref Range Status   07/02/2021 353 (H) <150 mg/dL Final    Comment:    . If a non-fasting specimen was collected, consider repeat triglyceride testing on a fasting specimen if clinically indicated.  Yates Decamp et al. J. of Clin. Lipidol. 4034;7:425-956. Marland Kitchen          Passed - Cr in normal range and within 360 days    Creat  Date Value Ref Range Status  07/02/2021 0.83 0.70 - 1.35 mg/dL Final   Creatinine, Urine  Date Value Ref Range Status  07/02/2021 56 20 - 320 mg/dL Final         Passed - Patient is not pregnant      Passed - Valid encounter within last 12 months    Recent Outpatient Visits          5 months ago Encounter for general adult medical examination with abnormal findings   [facility] [place], Coralie Keens, NP   11 months ago Chronic right shoulder pain   [facility] [place], Coralie Keens, NP   1 year ago Diet-controlled type 2 diabetes mellitus [facility])   [facility] [place], Coralie Keens, NP   1 year ago Acute left-sided low back pain without sciatica   [facility] [place], Coralie Keens, NP   1 year ago Lower urinary tract symptoms (LUTS)   [place], DO             . glipiZIDE ([place])  10 MG tablet [Pharmacy Med Name: GLIPIZIDE 10 MG TABLET] 180 tablet 0    Sig: TAKE 0.5 TABLETS BY MOUTH 2 TIMES DAILY BEFORE A MEAL FOR 10 DAYS, THEN 1 TABLET 2 TIMES DAILY     Endocrinology:  Diabetes - Sulfonylureas Failed - 12/26/2021  9:31 AM      Failed - HBA1C is between 0 and 7.9 and within 180 days    Hgb A1c MFr Bld  Date Value Ref Range Status  07/02/2021 10.5 (H) <5.7 % of total Hgb Final    Comment:    For someone without known diabetes, a hemoglobin A1c value of 6.5% or greater indicates that they may have  diabetes and this should be confirmed with a follow-up  test. . For someone with known diabetes, a value <7% indicates  that their diabetes is well controlled and a value  greater than  or equal to 7% indicates suboptimal  control. A1c targets should be individualized based on  duration of diabetes, age, comorbid conditions, and  other considerations. . Currently, no consensus exists regarding use of hemoglobin A1c for diagnosis of diabetes for children. .          Passed - Cr in normal range and within 360 days    Creat  Date Value Ref Range Status  07/02/2021 0.83 0.70 - 1.35 mg/dL Final   Creatinine, Urine  Date Value Ref Range Status  07/02/2021 56 20 - 320 mg/dL Final         Passed - Valid encounter within last 6 months    Recent Outpatient Visits          5 months ago Encounter for general adult medical examination with abnormal findings   [facility] [place], Coralie Keens, NP   11 months ago Chronic right shoulder pain   [facility] [place], Coralie Keens, NP   1 year ago Diet-controlled type 2 diabetes mellitus [facility])   [facility] [place], Coralie Keens, NP   1 year ago Acute left-sided low back pain without sciatica   [facility] [place], Coralie Keens, NP   1 year ago Lower urinary tract symptoms (LUTS)   [place], DO

## 2022-01-12 ENCOUNTER — Other Ambulatory Visit: Payer: Self-pay | Admitting: Internal Medicine

## 2022-01-12 NOTE — Telephone Encounter (Signed)
refilled 12/17/2021 #[id number] refills. Requested Prescriptions  Pending Prescriptions Disp Refills  . traZODone (DESYREL) 50 MG tablet [Pharmacy Med Name: TRAZODONE 50 MG TABLET] 90 tablet 1    Sig: TAKE 1 TABLET BY MOUTH AT BEDTIME AS NEEDED FOR SLEEP     Psychiatry: Antidepressants - Serotonin Modulator Failed - 01/12/2022  2:09 AM      Failed - Valid encounter within last 6 months    Recent Outpatient Visits          6 months ago Encounter for general adult medical examination with abnormal findings   [facility] [place], Coralie Keens, NP   1 year ago Chronic right shoulder pain   [facility] [place], Coralie Keens, NP   1 year ago Diet-controlled type 2 diabetes mellitus [facility] [place])   [facility] [place], Coralie Keens, NP   1 year ago Acute left-sided low back pain without sciatica   [facility] [place], Coralie Keens, NP   1 year ago Lower urinary tract symptoms (LUTS)   [place], DO

## 2022-02-17 ENCOUNTER — Telehealth: Payer: Self-pay

## 2022-02-17 NOTE — Telephone Encounter (Signed)
Left VM for patient to call to schedule nodule follow up LDCT

## 2022-02-18 NOTE — Telephone Encounter (Signed)
Reminder letter to schedule follow up nodule LDCT mailed to home.

## 2022-05-04 LAB — HM DIABETES EYE EXAM

## 2022-06-02 ENCOUNTER — Ambulatory Visit (INDEPENDENT_AMBULATORY_CARE_PROVIDER_SITE_OTHER): Payer: Medicare PPO | Admitting: Internal Medicine

## 2022-06-02 ENCOUNTER — Ambulatory Visit
Admission: RE | Admit: 2022-06-02 | Discharge: 2022-06-02 | Disposition: A | Discharge status: Home / Self Care | Payer: Medicare PPO | Source: Ambulatory Visit | Attending: Internal Medicine | Admitting: Internal Medicine

## 2022-06-02 ENCOUNTER — Ambulatory Visit
Admission: RE | Admit: 2022-06-02 | Discharge: 2022-06-02 | Disposition: A | Discharge status: Home / Self Care | Payer: Medicare PPO | Attending: Internal Medicine | Admitting: Internal Medicine

## 2022-06-02 ENCOUNTER — Encounter: Payer: Self-pay | Admitting: Internal Medicine

## 2022-06-02 VITALS — BP 136/82 | HR 99 | Temp 96.8°F | Wt 206.0 lb

## 2022-06-02 DIAGNOSIS — E1165 Type 2 diabetes mellitus with hyperglycemia: Secondary | ICD-10-CM | POA: Diagnosis not present

## 2022-06-02 DIAGNOSIS — M7989 Other specified soft tissue disorders: Secondary | ICD-10-CM | POA: Diagnosis not present

## 2022-06-02 DIAGNOSIS — M150 Primary generalized (osteo)arthritis: Secondary | ICD-10-CM

## 2022-06-02 DIAGNOSIS — W19XXXA Unspecified fall, initial encounter: Secondary | ICD-10-CM

## 2022-06-02 DIAGNOSIS — M159 Polyosteoarthritis, unspecified: Secondary | ICD-10-CM | POA: Diagnosis not present

## 2022-06-02 DIAGNOSIS — M79631 Pain in right forearm: Secondary | ICD-10-CM

## 2022-06-02 DIAGNOSIS — I700 Atherosclerosis of aorta: Secondary | ICD-10-CM

## 2022-06-02 DIAGNOSIS — E782 Mixed hyperlipidemia: Secondary | ICD-10-CM

## 2022-06-02 DIAGNOSIS — R2231 Localized swelling, mass and lump, right upper limb: Secondary | ICD-10-CM | POA: Diagnosis not present

## 2022-06-02 DIAGNOSIS — N400 Enlarged prostate without lower urinary tract symptoms: Secondary | ICD-10-CM | POA: Diagnosis not present

## 2022-06-02 DIAGNOSIS — E6609 Other obesity due to excess calories: Secondary | ICD-10-CM | POA: Diagnosis not present

## 2022-06-02 DIAGNOSIS — Z6833 Body mass index (BMI) 33.0-33.9, adult: Secondary | ICD-10-CM

## 2022-06-02 DIAGNOSIS — I10 Essential (primary) hypertension: Secondary | ICD-10-CM

## 2022-06-02 DIAGNOSIS — G4709 Other insomnia: Secondary | ICD-10-CM

## 2022-06-02 LAB — POCT GLYCOSYLATED HEMOGLOBIN (HGB A1C): Hemoglobin A1C: 10.3 % — AB (ref 4.0–5.6)

## 2022-06-02 NOTE — Assessment & Plan Note (Signed)
Encourage weight loss as this can help reduce joint pain Continue meloxicam as needed

## 2022-06-02 NOTE — Progress Notes (Signed)
Subjective:    Patient ID: Craig Bruce, male    DOB: [birth year], 67 y.o.   MRN: [id number]  HPI  Patient presents to clinic today for follow-up of chronic conditions.  DM2: His last A1c was 10.5 %, 06/2021.  He is taking Glipizide as prescribed.  He is on Lisinopril for renal protection.  He does not check his sugars.  He checks his feet routinely.  His last eye exam was 10/2020.  Flu 01/2022.  Pneumovax never.  Prevnar never.  [place] x 2.  OA: Mainly in his left knee and shoulders.  He takes Meloxicam and uses IcyHot as needed with good relief of symptoms.  He does not follow with orthopedics.  Insomnia: He has difficulty staying asleep.  He takes Trazodone as needed with some relief of symptoms.  There is no sleep study on file.  HLD with Aortic Atherosclerosis: His last LDL was 2 3, triglycerides 353, 06/2021.  He denies myalgias on Rosuvastatin and Ezetimibe. He is taking Aspirin as well.  He does not consume low-fat diet.  BPH: He reports urinary frequency.  He is taking Flomax as prescribed.  He does not follow with urology.  HTN: His BP today is 136/82.  He is taking Lisinopril as prescribed.  ECG from 11/2018 reviewed.  He also reports right arm discomfort.  This started 3 weeks ago after a fall in which he landed on his right forearm.  He describes the pain as sore and achy.  He has noticed a mass that has come up in that area after the fall.  The mass is not red, warm.  He has not noticed any drainage from this area.  He denies numbness, tingling or weakness of his right upper extremity.  Review of Systems  Past Medical History:  Diagnosis Date   Annual physical exam 03/24/2015   Arthritis    BOTH HANDS   Chronic kidney disease    H/O KIDNEY STONES   DVT (deep venous thrombosis) ([place]) 1996   Gastritis    Immunization due 03/24/2015   Need for influenza vaccination 03/24/2015   Obesity (BMI 30.0-34.9) 02/17/2015   Smoker 02/17/2015    Current Outpatient  Medications  Medication Sig Dispense Refill   aspirin 81 MG EC tablet Take 1 tablet (81 mg total) by mouth daily. Swallow whole. 30 tablet 12   ezetimibe (ZETIA) 10 MG tablet TAKE 1 TABLET BY MOUTH EVERY DAY 90 tablet 1   glipiZIDE (GLUCOTROL) 10 MG tablet TAKE 0.5 TABLETS BY MOUTH 2 TIMES DAILY BEFORE A MEAL FOR 10 DAYS, THEN 1 TABLET 2 TIMES DAILY 180 tablet 0   lisinopril (ZESTRIL) 5 MG tablet Take 1 tablet (5 mg total) by mouth daily. 90 tablet 1   meloxicam (MOBIC) 15 MG tablet Take 1 tablet (15 mg total) by mouth daily. 90 tablet 0   rosuvastatin (CRESTOR) 10 MG tablet TAKE 1 TABLET BY MOUTH EVERY DAY 90 tablet 0   tamsulosin (FLOMAX) 0.4 MG CAPS capsule Take 1 capsule (0.4 mg total) by mouth daily after breakfast. 90 capsule 0   traZODone (DESYREL) 50 MG tablet TAKE 1 TABLET BY MOUTH EVERY DAY AT BEDTIME AS NEEDED FOR SLEEP 90 tablet 0   No current facility-administered medications for this visit.    Allergies  Allergen Reactions   Other     BLOOD THINNER USED 20 YEARS AGO-PT UNSURE EXACTLY OF NAME-CAUSED FEVERS OF 104 PER PT    Family History  Problem Relation Age of Onset  Alcohol abuse Mother    Alcohol abuse Father    Prostate cancer Neg Hx    Kidney cancer Neg Hx    Kidney failure Neg Hx    Colon cancer Neg Hx     Social History   Socioeconomic History   Marital status: Significant Other    Spouse name: Not on file   Number of children: 1   Years of education: Not on file   Highest education level: Not on file  Occupational History   Occupation: retired    Comment: Retired 2022  Tobacco Use   Smoking status: Every Day    Packs/day: 1.00    Years: 24.00    Total pack years: 24.00    Types: Cigarettes   Smokeless tobacco: Never   Tobacco comments:    Previously smoked 1ppd.  Cutting back and down from 5 cigs / day to 4 cigs per day in 4 weeks.  Vaping Use   Vaping Use: Never used  Substance and Sexual Activity   Alcohol use: Yes    Alcohol/week: 2.0  standard drinks of alcohol    Types: 2 Cans of beer per week    Comment: BEER OCC   Drug use: No   Sexual activity: Not Currently  Other Topics Concern   Not on file  Social History Narrative   Not on file   Social Determinants of Health   Financial Resource Strain: Low Risk  (07/06/2021)   Overall Financial Resource Strain (CARDIA)    Difficulty of Paying Living Expenses: Not hard at all  Food Insecurity: No Food Insecurity (07/06/2021)   Hunger Vital Sign    Worried About Running Out of Food in the Last Year: Never true    Ran Out of Food in the Last Year: Never true  Transportation Needs: No Transportation Needs (07/06/2021)   PRAPARE - Hydrologist (Medical): No    Lack of Transportation (Non-Medical): No  Physical Activity: Sufficiently Active (07/06/2021)   Exercise Vital Sign    Days of Exercise per Week: 7 days    Minutes of Exercise per Session: 40 min  Stress: No Stress Concern Present (07/06/2021)   [place]    Feeling of Stress : Not at all  Social Connections: [place] (07/06/2021)   Social Connection and Isolation Panel [NHANES]    Frequency of Communication with Friends and Family: Twice a week    Frequency of Social Gatherings with Friends and Family: Once a week    Attends Religious Services: More than 4 times per year    Active Member of [employer] or Organizations: Yes    Attends Archivist Meetings: More than 4 times per year    Marital Status: Living with partner  Intimate Partner Violence: Not At Risk (07/06/2021)   Humiliation, Afraid, Rape, and Kick questionnaire    Fear of Current or Ex-Partner: No    Emotionally Abused: No    Physically Abused: No    Sexually Abused: No     Constitutional: Denies fever, malaise, fatigue, headache or abrupt weight changes.  HEENT: Denies eye pain, eye redness, ear pain, ringing in the ears, wax buildup,  runny nose, nasal congestion, bloody nose, or sore throat. Respiratory: Denies difficulty breathing, shortness of breath, cough or sputum production.   Cardiovascular: Denies chest pain, chest tightness, palpitations or swelling in the hands or feet.  Gastrointestinal: Denies abdominal pain, bloating, constipation, diarrhea or blood in  the stool.  GU: Pt reports urinary frequency. Denies urgency, pain with urination, burning sensation, blood in urine, odor or discharge. Musculoskeletal: Patient reports right arm pain.  Denies decrease in range of motion, difficulty with gait, muscle pain or joint pain and swelling.  Skin: Patient reports mass of right forearm.  Denies redness, rashes, or ulcercations.  Neurological: Patient reports insomnia.  Denies dizziness, difficulty with memory, difficulty with speech or problems with balance and coordination.  Psych: Denies anxiety, depression, SI/HI.  No other specific complaints in a complete review of systems (except as listed in HPI above).     Objective:   Physical Exam   BP 136/82 (BP Location: Left Arm, Patient Position: Sitting, Cuff Size: Large)   Pulse 99   Temp (!) 96.8 F (36 C) (Temporal)   Wt 206 lb (93.4 kg)   SpO2 100%   BMI 33.25 kg/m   Wt Readings from Last 3 Encounters:  07/06/21 211 lb 9.6 oz (96 kg)  07/02/21 210 lb (95.3 kg)  01/09/21 220 lb 9.6 oz (100.1 kg)    General: Appears his stated age, obese, in NAD. Skin: Warm, dry and intact.  2 cm nonfluctuant mass noted overlying the right ulna. HEENT: Head: normal shape and size; Eyes: sclera white, no icterus, conjunctiva pink, PERRLA and EOMs intact;  Cardiovascular: Normal rate and rhythm. S1,S2 noted.  No murmur, rubs or gallops noted. No JVD or BLE edema. No carotid bruits noted. Pulmonary/Chest: Normal effort and positive vesicular breath sounds. No respiratory distress. No wheezes, rales or ronchi noted.  Musculoskeletal: Normal flexion, extension and rotation of  the right elbow.  Normal flexion, extension and rotation of the right wrist.  No pain with palpation of the right radius or ulna.  Handgrips equal.  No difficulty with gait.  Neurological: Alert and oriented. Coordination normal.     BMET    Component Value Date/Time   NA 135 07/02/2021 0921   NA 140 03/25/2015 0802   NA 137 05/19/2013 0254   K 4.6 07/02/2021 0921   K 3.9 05/19/2013 0254   CL 103 07/02/2021 0921   CL 107 05/19/2013 0254   CO2 24 07/02/2021 0921   CO2 25 05/19/2013 0254   GLUCOSE 344 (H) 07/02/2021 0921   GLUCOSE 124 (H) 05/19/2013 0254   BUN 17 07/02/2021 0921   BUN 15 03/25/2015 0802   BUN 14 05/19/2013 0254   CREATININE 0.83 07/02/2021 0921   CALCIUM 9.0 07/02/2021 0921   CALCIUM 8.5 05/19/2013 0254   GFRNONAA >60 12/09/2018 0500   GFRNONAA 96 11/03/2017 0821   GFRAA >60 12/09/2018 0500   GFRAA 111 11/03/2017 0821    Lipid Panel     Component Value Date/Time   CHOL 305 (H) 07/02/2021 0921   CHOL 243 (H) 03/25/2015 0802   TRIG 353 (H) 07/02/2021 0921   HDL 42 07/02/2021 0921   HDL 38 (L) 03/25/2015 0802   CHOLHDL 7.3 (H) 07/02/2021 0921   VLDL 48 (H) 10/01/2016 1140   LDLCALC 203 (H) 07/02/2021 0921    CBC    Component Value Date/Time   WBC 6.3 07/02/2021 0921   RBC 5.82 (H) 07/02/2021 0921   HGB 18.5 (H) 07/02/2021 0921   HGB 16.0 03/25/2015 0802   HCT 53.7 (H) 07/02/2021 0921   HCT 45.1 03/25/2015 0802   PLT 178 07/02/2021 0921   PLT 190 03/25/2015 0802   MCV 92.3 07/02/2021 0921   MCV 90 03/25/2015 0802   MCV 89 05/19/2013 0254  MCH 31.8 07/02/2021 0921   MCHC 34.5 07/02/2021 0921   RDW 12.3 07/02/2021 0921   RDW 13.4 03/25/2015 0802   RDW 13.2 05/19/2013 0254   LYMPHSABS 2.2 12/09/2018 0500   LYMPHSABS 1.6 03/25/2015 0802   LYMPHSABS 1.4 05/19/2013 0254   MONOABS 0.8 12/09/2018 0500   MONOABS 1.2 (H) 05/19/2013 0254   EOSABS 0.1 12/09/2018 0500   EOSABS 0.2 03/25/2015 0802   EOSABS 0.1 05/19/2013 0254   BASOSABS 0.0  12/09/2018 0500   BASOSABS 0.0 03/25/2015 0802   BASOSABS 0.1 05/19/2013 0254    Hgb A1C Lab Results  Component Value Date   HGBA1C 10.5 (H) 07/02/2021           Assessment & Plan:   Right Forearm Pain, Mass of Right Forearm s/p Fall:  X-ray right forearm today to evaluate for fracture Consider ultrasound soft tissue of right forearm for further evaluation of mass if not visualized on x-ray  RTC in 3 months for annual exam Webb Sil[place], NP

## 2022-06-02 NOTE — Assessment & Plan Note (Signed)
-   Continue Flomax 

## 2022-06-02 NOTE — Assessment & Plan Note (Signed)
POCT A1c 10.3% We will check urine microalbumin Encouraged him to consume the low-carb diet and exercise for weight loss Continue glipizide We will add Januvia 100 mg daily Will request copy of eye exam Encouraged routine foot exam

## 2022-06-02 NOTE — Assessment & Plan Note (Signed)
C-Met and lipid profile today Encouraged him to consume a low-fat diet Continue rosuvastatin and ezetimibe 

## 2022-06-02 NOTE — Assessment & Plan Note (Signed)
Continue trazodone 

## 2022-06-02 NOTE — Assessment & Plan Note (Signed)
Controlled on lisinopril Reinforced DASH diet and exercise for weight loss C-Met today 

## 2022-06-02 NOTE — Assessment & Plan Note (Signed)
C-Met and lipid profile today Encouraged him to consume low-fat diet Continue rosuvastatin and ezetimibe We will have him start baby aspirin

## 2022-06-02 NOTE — Patient Instructions (Signed)

## 2022-06-02 NOTE — Assessment & Plan Note (Signed)
Encourage diet and exercise for weight loss 

## 2022-06-03 LAB — COMPLETE METABOLIC PANEL WITH GFR
AG Ratio: 1.3 (calc) (ref 1.0–2.5)
ALT: 22 U/L (ref 9–46)
AST: 21 U/L (ref 10–35)
Albumin: 4.3 g/dL (ref 3.6–5.1)
Alkaline phosphatase (APISO): 106 U/L (ref 35–144)
BUN: 9 mg/dL (ref 7–25)
CO2: 26 mmol/L (ref 20–32)
Calcium: 9.3 mg/dL (ref 8.6–10.3)
Chloride: 101 mmol/L (ref 98–110)
Creat: 0.79 mg/dL (ref 0.70–1.35)
Globulin: 3.2 g/dL (calc) (ref 1.9–3.7)
Glucose, Bld: 179 mg/dL — ABNORMAL HIGH (ref 65–99)
Potassium: 4.6 mmol/L (ref 3.5–5.3)
Sodium: 140 mmol/L (ref 135–146)
Total Bilirubin: 1.1 mg/dL (ref 0.2–1.2)
Total Protein: 7.5 g/dL (ref 6.1–8.1)
eGFR: 97 mL/min/{1.73_m2} (ref >=60)

## 2022-06-03 LAB — LIPID PANEL
Cholesterol: 332 mg/dL — ABNORMAL HIGH (ref <200)
HDL: 50 mg/dL (ref >=40)
LDL Cholesterol (Calc): 214 mg/dL (calc) — ABNORMAL HIGH
Non-HDL Cholesterol (Calc): 282 mg/dL (calc) — ABNORMAL HIGH (ref <130)
Total CHOL/HDL Ratio: 6.6 (calc) — ABNORMAL HIGH (ref <5.0)
Triglycerides: 399 mg/dL — ABNORMAL HIGH (ref <150)

## 2022-06-03 LAB — CBC
HCT: 50.5 % — ABNORMAL HIGH (ref 38.5–50.0)
Hemoglobin: 18.1 g/dL — ABNORMAL HIGH (ref 13.2–17.1)
MCH: 31.6 pg (ref 27.0–33.0)
MCHC: 35.8 g/dL (ref 32.0–36.0)
MCV: 88.3 fL (ref 80.0–100.0)
MPV: 10.4 fL (ref 7.5–12.5)
Platelets: 235 10*3/uL (ref 140–400)
RBC: 5.72 10*6/uL (ref 4.20–5.80)
RDW: 12.4 % (ref 11.0–15.0)
WBC: 7.8 10*3/uL (ref 3.8–10.8)

## 2022-06-03 LAB — MICROALBUMIN / CREATININE URINE RATIO
Creatinine, Urine: 61 mg/dL (ref 20–320)
Microalb Creat Ratio: 46 mcg/mg creat — ABNORMAL HIGH (ref <30)
Microalb, Ur: 2.8 mg/dL

## 2022-06-21 ENCOUNTER — Encounter: Payer: Self-pay | Admitting: Internal Medicine

## 2022-10-04 ENCOUNTER — Encounter: Payer: Self-pay | Admitting: Internal Medicine

## 2022-10-04 ENCOUNTER — Ambulatory Visit (INDEPENDENT_AMBULATORY_CARE_PROVIDER_SITE_OTHER): Payer: Medicare PPO | Admitting: Internal Medicine

## 2022-10-04 VITALS — BP 114/78 | HR 87 | Temp 96.8°F | Wt 205.0 lb

## 2022-10-04 DIAGNOSIS — Z6833 Body mass index (BMI) 33.0-33.9, adult: Secondary | ICD-10-CM | POA: Diagnosis not present

## 2022-10-04 DIAGNOSIS — E1165 Type 2 diabetes mellitus with hyperglycemia: Secondary | ICD-10-CM | POA: Diagnosis not present

## 2022-10-04 DIAGNOSIS — E6609 Other obesity due to excess calories: Secondary | ICD-10-CM

## 2022-10-04 DIAGNOSIS — I712 Thoracic aortic aneurysm, without rupture, unspecified: Secondary | ICD-10-CM | POA: Insufficient documentation

## 2022-10-04 DIAGNOSIS — Z0001 Encounter for general adult medical examination with abnormal findings: Secondary | ICD-10-CM

## 2022-10-04 DIAGNOSIS — Z125 Encounter for screening for malignant neoplasm of prostate: Secondary | ICD-10-CM

## 2022-10-04 NOTE — Assessment & Plan Note (Signed)
Encourage diet and exercise for weight loss 

## 2022-10-04 NOTE — Patient Instructions (Signed)
Health Maintenance After Age 68 After age 68, you are at a higher risk for certain long-term diseases and infections as well as injuries from falls. Falls are a major cause of broken bones and head injuries in people who are older than age 68. Getting regular preventive care can help to keep you healthy and well. Preventive care includes getting regular testing and making lifestyle changes as recommended by your health care provider. Talk with your health care provider about: Which screenings and tests you should have. A screening is a test that checks for a disease when you have no symptoms. A diet and exercise plan that is right for you. What should I know about screenings and tests to prevent falls? Screening and testing are the best ways to find a health problem early. Early diagnosis and treatment give you the best chance of managing medical conditions that are common after age 68. Certain conditions and lifestyle choices may make you more likely to have a fall. Your health care provider may recommend: Regular vision checks. Poor vision and conditions such as cataracts can make you more likely to have a fall. If you wear glasses, make sure to get your prescription updated if your vision changes. Medicine review. Work with your health care provider to regularly review all of the medicines you are taking, including over-the-counter medicines. Ask your health care provider about any side effects that may make you more likely to have a fall. Tell your health care provider if any medicines that you take make you feel dizzy or sleepy. Strength and balance checks. Your health care provider may recommend certain tests to check your strength and balance while standing, walking, or changing positions. Foot health exam. Foot pain and numbness, as well as not wearing proper footwear, can make you more likely to have a fall. Screenings, including: Osteoporosis screening. Osteoporosis is a condition that causes  the bones to get weaker and break more easily. Blood pressure screening. Blood pressure changes and medicines to control blood pressure can make you feel dizzy. Depression screening. You may be more likely to have a fall if you have a fear of falling, feel depressed, or feel unable to do activities that you used to do. Alcohol use screening. Using too much alcohol can affect your balance and may make you more likely to have a fall. Follow these instructions at home: Lifestyle Do not drink alcohol if: Your health care provider tells you not to drink. If you drink alcohol: Limit how much you have to: 0-1 drink a day for women. 0-2 drinks a day for men. Know how much alcohol is in your drink. In the [place] one drink equals one 12 oz bottle of beer (355 mL), one 5 oz glass of wine (148 mL), or one 1 oz glass of hard liquor (44 mL). Do not use any products that contain nicotine or tobacco. These products include cigarettes, chewing tobacco, and vaping devices, such as e-cigarettes. If you need help quitting, ask your health care provider. Activity  Follow a regular exercise program to stay fit. This will help you maintain your balance. Ask your health care provider what types of exercise are appropriate for you. If you need a cane or walker, use it as recommended by your health care provider. Wear supportive shoes that have nonskid soles. Safety  Remove any tripping hazards, such as rugs, cords, and clutter. Install safety equipment such as grab bars in bathrooms and safety rails on stairs. Keep rooms and walkways   well-lit. General instructions Talk with your health care provider about your risks for falling. Tell your health care provider if: You fall. Be sure to tell your health care provider about all falls, even ones that seem minor. You feel dizzy, tiredness (fatigue), or off-balance. Take over-the-counter and prescription medicines only as told by your health care provider. These include  supplements. Eat a healthy diet and maintain a healthy weight. A healthy diet includes low-fat dairy products, low-fat (lean) meats, and fiber from whole grains, beans, and lots of fruits and vegetables. Stay current with your vaccines. Schedule regular health, dental, and eye exams. Summary Having a healthy lifestyle and getting preventive care can help to protect your health and wellness after age 68. Screening and testing are the best way to find a health problem early and help you avoid having a fall. Early diagnosis and treatment give you the best chance for managing medical conditions that are more common for people who are older than age 68. Falls are a major cause of broken bones and head injuries in people who are older than age 68. Take precautions to prevent a fall at home. Work with your health care provider to learn what changes you can make to improve your health and wellness and to prevent falls. This information is not intended to replace advice given to you by your health care provider. Make sure you discuss any questions you have with your health care provider. Document Revised: 09/29/2020 Document Reviewed: 09/29/2020 Elsevier Patient Education  2023 [employer]vier Inc.  

## 2022-10-04 NOTE — Progress Notes (Signed)
Subjective:    Patient ID: Craig Bruce, male    DOB: [birth year], 67 y.o.   MRN: [id number]  HPI  Patient presents to clinic today for his annual exam.  Flu: 04/2022 Tetanus: 02/2015 COVID: Pfizer x 2 Pneumovax: Never Prevnar: Never Shingrix: Never PSA screening: 06/2021 Colon screening: 08/2018 Vision screening: as needed Dentist: as needed  Diet: He does not eat much meat. He consumes fruits and veggies. He tries to avoid fried foods. He drinks mostly water, soda. Exercise: Raising chickens  Review of Systems     Past Medical History:  Diagnosis Date   Annual physical exam 03/24/2015   Arthritis    BOTH HANDS   Chronic kidney disease    H/O KIDNEY STONES   DVT (deep venous thrombosis) (HCC) 1996   Gastritis    Immunization due 03/24/2015   Need for influenza vaccination 03/24/2015   Obesity (BMI 30.0-34.9) 02/17/2015   Smoker 02/17/2015    Current Outpatient Medications  Medication Sig Dispense Refill   aspirin 81 MG EC tablet Take 1 tablet (81 mg total) by mouth daily. Swallow whole. 30 tablet 12   ezetimibe (ZETIA) 10 MG tablet TAKE 1 TABLET BY MOUTH EVERY DAY 90 tablet 1   glipiZIDE (GLUCOTROL) 10 MG tablet TAKE 0.5 TABLETS BY MOUTH 2 TIMES DAILY BEFORE A MEAL FOR 10 DAYS, THEN 1 TABLET 2 TIMES DAILY 180 tablet 0   lisinopril (ZESTRIL) 5 MG tablet Take 1 tablet (5 mg total) by mouth daily. 90 tablet 1   meloxicam (MOBIC) 15 MG tablet Take 1 tablet (15 mg total) by mouth daily. 90 tablet 0   rosuvastatin (CRESTOR) 10 MG tablet TAKE 1 TABLET BY MOUTH EVERY DAY 90 tablet 0   tamsulosin (FLOMAX) 0.4 MG CAPS capsule Take 1 capsule (0.4 mg total) by mouth daily after breakfast. 90 capsule 0   traZODone (DESYREL) 50 MG tablet TAKE 1 TABLET BY MOUTH EVERY DAY AT BEDTIME AS NEEDED FOR SLEEP 90 tablet 0   No current facility-administered medications for this visit.    Allergies  Allergen Reactions   Other     BLOOD THINNER USED 20 YEARS AGO-PT UNSURE EXACTLY OF  NAME-CAUSED FEVERS OF 104 PER PT    Family History  Problem Relation Age of Onset   Alcohol abuse Mother    Alcohol abuse Father    Prostate cancer Neg Hx    Kidney cancer Neg Hx    Kidney failure Neg Hx    Colon cancer Neg Hx     Social History   Socioeconomic History   Marital status: Significant Other    Spouse name: Not on file   Number of children: 1   Years of education: Not on file   Highest education level: Not on file  Occupational History   Occupation: retired    Comment: Retired 2022  Tobacco Use   Smoking status: Every Day    Packs/day: 1.00    Years: 24.00    Additional pack years: 0.00    Total pack years: 24.00    Types: Cigarettes   Smokeless tobacco: Never   Tobacco comments:    Previously smoked 1ppd.  Cutting back and down from 5 cigs / day to 4 cigs per day in 4 weeks.  Vaping Use   Vaping Use: Never used  Substance and Sexual Activity   Alcohol use: Yes    Alcohol/week: 2.0 standard drinks of alcohol    Types: 2 Cans of beer per week  Comment: BEER [facility]   Drug use: No   Sexual activity: Not Currently  Other Topics Concern   Not on file  Social History Narrative   Not on file   Social Determinants of Health   Financial Resource Strain: Low Risk  (07/06/2021)   Overall Financial Resource Strain (CARDIA)    Difficulty of Paying Living Expenses: Not hard at all  Food Insecurity: No Food Insecurity (07/06/2021)   Hunger Vital Sign    Worried About Running Out of Food in the Last Year: Never true    Ran Out of Food in the Last Year: Never true  Transportation Needs: No Transportation Needs (07/06/2021)   PRAPARE - Administrator, Civil Service (Medical): No    Lack of Transportation (Non-Medical): No  Physical Activity: Sufficiently Active (07/06/2021)   Exercise Vital Sign    Days of Exercise per Week: 7 days    Minutes of Exercise per Session: 40 min  Stress: No Stress Concern Present (07/06/2021)   [employer] of  Occupational Health - Occupational Stress Questionnaire    Feeling of Stress : Not at all  Social Connections: Socially Integrated (07/06/2021)   Social Connection and Isolation Panel [NHANES]    Frequency of Communication with Friends and Family: Twice a week    Frequency of Social Gatherings with Friends and Family: Once a week    Attends Religious Services: More than 4 times per year    Active Member of [employer] or Organizations: Yes    Attends Banker Meetings: More than 4 times per year    Marital Status: Living with partner  Intimate Partner Violence: Not At Risk (07/06/2021)   Humiliation, Afraid, Rape, and Kick questionnaire    Fear of Current or Ex-Partner: No    Emotionally Abused: No    Physically Abused: No    Sexually Abused: No     Constitutional: Denies fever, malaise, fatigue, headache or abrupt weight changes.  HEENT: Denies eye pain, eye redness, ear pain, ringing in the ears, wax buildup, runny nose, nasal congestion, bloody nose, or sore throat. Respiratory: Denies difficulty breathing, shortness of breath, cough or sputum production.   Cardiovascular: Denies chest pain, chest tightness, palpitations or swelling in the hands or feet.  Gastrointestinal: Denies abdominal pain, bloating, constipation, diarrhea or blood in the stool.  GU: Denies urgency, frequency, pain with urination, burning sensation, blood in urine, odor or discharge. Musculoskeletal: Patient reports joint pain.  Denies decrease in range of motion, difficulty with gait, muscle pain or joint swelling.  Skin: Denies redness, rashes, lesions or ulcercations.  Neurological: Patient reports insomnia.  Denies dizziness, difficulty with memory, difficulty with speech or problems with balance and coordination.  Psych: Denies anxiety, depression, SI/HI.  No other specific complaints in a complete review of systems (except as listed in HPI above).  Objective:   Physical Exam  BP 114/78 (BP  Location: Left Arm, Patient Position: Sitting, Cuff Size: Normal)   Pulse 87   Temp (!) 96.8 F (36 C) (Temporal)   Wt 205 lb (93 kg)   SpO2 98%   BMI 33.09 kg/m   Wt Readings from Last 3 Encounters:  06/02/22 206 lb (93.4 kg)  07/06/21 211 lb 9.6 oz (96 kg)  07/02/21 210 lb (95.3 kg)    General: Appears his stated age, obese, in NAD. Skin: Warm, dry and intact. No ulcerations noted. HEENT: Head: normal shape and size; Eyes: sclera white, no icterus, conjunctiva pink, PERRLA and EOMs intact;  Neck:  Neck supple, trachea midline. No masses, lumps or thyromegaly present.  Cardiovascular: Normal rate and rhythm. S1,S2 noted.  No murmur, rubs or gallops noted. No JVD or BLE edema. No carotid bruits noted. Pulmonary/Chest: Normal effort and positive vesicular breath sounds. No respiratory distress. No wheezes, rales or ronchi noted.  Abdomen: Normal bowel sounds.  Musculoskeletal: Kyphotic.  Strength 5/5 BUE/BLE.  No difficulty with gait.  Neurological: Alert and oriented. Cranial nerves II-XII grossly intact. Coordination normal.  Psychiatric: Mood and affect normal. Behavior is normal. Judgment and thought content normal.     BMET    Component Value Date/Time   NA 140 06/02/2022 1028   NA 140 03/25/2015 0802   NA 137 05/19/2013 0254   K 4.6 06/02/2022 1028   K 3.9 05/19/2013 0254   CL 101 06/02/2022 1028   CL 107 05/19/2013 0254   CO2 26 06/02/2022 1028   CO2 25 05/19/2013 0254   GLUCOSE 179 (H) 06/02/2022 1028   GLUCOSE 124 (H) 05/19/2013 0254   BUN 9 06/02/2022 1028   BUN 15 03/25/2015 0802   BUN 14 05/19/2013 0254   CREATININE 0.79 06/02/2022 1028   CALCIUM 9.3 06/02/2022 1028   CALCIUM 8.5 05/19/2013 0254   GFRNONAA >60 12/09/2018 0500   GFRNONAA 96 11/03/2017 0821   GFRAA >60 12/09/2018 0500   GFRAA 111 11/03/2017 0821    Lipid Panel     Component Value Date/Time   CHOL 332 (H) 06/02/2022 1028   CHOL 243 (H) 03/25/2015 0802   TRIG 399 (H) 06/02/2022 1028    HDL 50 06/02/2022 1028   HDL 38 (L) 03/25/2015 0802   CHOLHDL 6.6 (H) 06/02/2022 1028   VLDL 48 (H) 10/01/2016 1140   LDLCALC 214 (H) 06/02/2022 1028    CBC    Component Value Date/Time   WBC 7.8 06/02/2022 1028   RBC 5.72 06/02/2022 1028   HGB 18.1 (H) 06/02/2022 1028   HGB 16.0 03/25/2015 0802   HCT 50.5 (H) 06/02/2022 1028   HCT 45.1 03/25/2015 0802   PLT 235 06/02/2022 1028   PLT 190 03/25/2015 0802   MCV 88.3 06/02/2022 1028   MCV 90 03/25/2015 0802   MCV 89 05/19/2013 0254   MCH 31.6 06/02/2022 1028   MCHC 35.8 06/02/2022 1028   RDW 12.4 06/02/2022 1028   RDW 13.4 03/25/2015 0802   RDW 13.2 05/19/2013 0254   LYMPHSABS 2.2 12/09/2018 0500   LYMPHSABS 1.6 03/25/2015 0802   LYMPHSABS 1.4 05/19/2013 0254   MONOABS 0.8 12/09/2018 0500   MONOABS 1.2 (H) 05/19/2013 0254   EOSABS 0.1 12/09/2018 0500   EOSABS 0.2 03/25/2015 0802   EOSABS 0.1 05/19/2013 0254   BASOSABS 0.0 12/09/2018 0500   BASOSABS 0.0 03/25/2015 0802   BASOSABS 0.1 05/19/2013 0254    Hgb A1C Lab Results  Component Value Date   HGBA1C 10.3 (A) 06/02/2022            Assessment & Plan:   Preventative Health Maintenance:  Encouraged him to get a flu shot in the fall Tetanus UTD Encouraged him to get his COVID booster He declines Pneumovax or Prevnar  Discussed Shingrix vaccine, he will check coverage with his insurance company and schedule a visit if he would like to have this done Colon screening UTD He declines CT lung cancer screening Encouraged him to consume a balanced diet and exercise regimen Advised him to see an eye doctor and dentist annually We will check CBC, c-Met, lipid, A1c and  PSA today  RTC in 3 months, follow-up chronic conditions Nicki Reaper, NP

## 2022-10-05 LAB — COMPLETE METABOLIC PANEL WITH GFR
AG Ratio: 1.4 (calc) (ref 1.0–2.5)
ALT: 20 U/L (ref 9–46)
AST: 16 U/L (ref 10–35)
Albumin: 4 g/dL (ref 3.6–5.1)
Alkaline phosphatase (APISO): 97 U/L (ref 35–144)
BUN: 13 mg/dL (ref 7–25)
CO2: 27 mmol/L (ref 20–32)
Calcium: 9.1 mg/dL (ref 8.6–10.3)
Chloride: 101 mmol/L (ref 98–110)
Creat: 0.78 mg/dL (ref 0.70–1.35)
Globulin: 2.8 g/dL (calc) (ref 1.9–3.7)
Glucose, Bld: 389 mg/dL — ABNORMAL HIGH (ref 65–139)
Potassium: 4.6 mmol/L (ref 3.5–5.3)
Sodium: 137 mmol/L (ref 135–146)
Total Bilirubin: 0.9 mg/dL (ref 0.2–1.2)
Total Protein: 6.8 g/dL (ref 6.1–8.1)
eGFR: 98 mL/min/{1.73_m2} (ref >=60)

## 2022-10-05 LAB — LIPID PANEL
Cholesterol: 340 mg/dL — ABNORMAL HIGH (ref <200)
HDL: 38 mg/dL — ABNORMAL LOW (ref >=40)
LDL Cholesterol (Calc): 236 mg/dL (calc) — ABNORMAL HIGH
Non-HDL Cholesterol (Calc): 302 mg/dL (calc) — ABNORMAL HIGH (ref <130)
Total CHOL/HDL Ratio: 8.9 (calc) — ABNORMAL HIGH (ref <5.0)
Triglycerides: 389 mg/dL — ABNORMAL HIGH (ref <150)

## 2022-10-05 LAB — CBC
HCT: 49.2 % (ref 38.5–50.0)
Hemoglobin: 17 g/dL (ref 13.2–17.1)
MCH: 31.3 pg (ref 27.0–33.0)
MCHC: 34.6 g/dL (ref 32.0–36.0)
MCV: 90.6 fL (ref 80.0–100.0)
MPV: 10.3 fL (ref 7.5–12.5)
Platelets: 203 10*3/uL (ref 140–400)
RBC: 5.43 10*6/uL (ref 4.20–5.80)
RDW: 12.3 % (ref 11.0–15.0)
WBC: 6.3 10*3/uL (ref 3.8–10.8)

## 2022-10-05 LAB — PSA: PSA: 3.96 ng/mL (ref <=4.00)

## 2022-10-05 LAB — HEMOGLOBIN A1C
Hgb A1c MFr Bld: 10.1 % of total Hgb — ABNORMAL HIGH (ref <5.7)
Mean Plasma Glucose: 243 mg/dL
eAG (mmol/L): 13.5 mmol/L

## 2022-10-06 ENCOUNTER — Other Ambulatory Visit: Payer: Self-pay

## 2022-10-06 DIAGNOSIS — E782 Mixed hyperlipidemia: Secondary | ICD-10-CM

## 2022-10-06 NOTE — Telephone Encounter (Signed)
Pt advised.  He states he has not been taking any of his medicines regularly.  Maybe three times a week at best.  I review with him what medicines he has at home.  He is missing Zetia.  He agrees to start taking all of his medicines everyday and will follow up in three months.    Thanks,   -[name]

## 2022-10-06 NOTE — Telephone Encounter (Signed)
-----   Message from Lorre Munroe, NP sent at 10/05/2022  9:07 AM EDT ----- Liver and kidney function is normal.  Cholesterol and triglycerides are very elevated.  Is he taking the rosuvastatin and Zetia as prescribed?  If he is, we may want to increase the rosuvastatin to 20 mg daily.  His A1c is still 10%, not good.  We need to add additional medication to get his diabetes under control.  Would he be willing to start [facility] which is a weekly injectable medication to help him lose weight and lower his glucose?  We really need to get his diabetes and cholesterol under control as he is a very high risk for heart attack and stroke.  His blood counts are normal.  His PSA is normal.

## 2022-10-07 MED ORDER — EZETIMIBE 10 MG PO TABS: 10.0000 mg | ORAL_TABLET | Freq: Every day | ORAL | 1 refills | Status: DC

## 2022-10-08 ENCOUNTER — Other Ambulatory Visit: Payer: Self-pay | Admitting: Internal Medicine

## 2022-10-08 NOTE — Telephone Encounter (Signed)
Requested Prescriptions  Pending Prescriptions Disp Refills   traZODone (DESYREL) 50 MG tablet [Pharmacy Med Name: TRAZODONE 50 MG TABLET] 90 tablet 1    Sig: TAKE 1 TABLET BY MOUTH AT BEDTIME AS NEEDED FOR SLEEP     Psychiatry: Antidepressants - Serotonin Modulator Passed - 10/08/2022 12:55 PM      Passed - Valid encounter within last 6 months    Recent Outpatient Visits           4 days ago Encounter for general adult medical examination with abnormal findings   [place] [facility] [place], [place] W, NP   4 months ago Type 2 diabetes mellitus with hyperglycemia, without long-term current use of insulin [facility])   [facility] [place], Salvadore Oxford, NP   1 year ago Encounter for general adult medical examination with abnormal findings   [place] [place], Salvadore Oxford, NP   1 year ago Chronic right shoulder pain   [place] [facility] [place], Salvadore Oxford, NP   1 year ago Diet-controlled type 2 diabetes mellitus [facility])   [place] [facility] [place], Salvadore Oxford, NP       Future Appointments             In 3 months Baity, Salvadore Oxford, NP [place] [facility], [facility]

## 2022-11-01 ENCOUNTER — Ambulatory Visit: Payer: Self-pay | Admitting: *Deleted

## 2022-11-01 ENCOUNTER — Emergency Department
Admission: EM | Admit: 2022-11-01 | Discharge: 2022-11-01 | Disposition: A | Discharge status: Skilled Nursing Facility | Payer: Medicare PPO | Attending: Emergency Medicine | Admitting: Emergency Medicine

## 2022-11-01 ENCOUNTER — Emergency Department: Payer: Medicare PPO

## 2022-11-01 ENCOUNTER — Other Ambulatory Visit: Payer: Self-pay

## 2022-11-01 DIAGNOSIS — E119 Type 2 diabetes mellitus without complications: Secondary | ICD-10-CM | POA: Insufficient documentation

## 2022-11-01 DIAGNOSIS — I10 Essential (primary) hypertension: Secondary | ICD-10-CM | POA: Insufficient documentation

## 2022-11-01 DIAGNOSIS — M5412 Radiculopathy, cervical region: Secondary | ICD-10-CM | POA: Diagnosis not present

## 2022-11-01 DIAGNOSIS — R079 Chest pain, unspecified: Secondary | ICD-10-CM | POA: Diagnosis not present

## 2022-11-01 DIAGNOSIS — R202 Paresthesia of skin: Secondary | ICD-10-CM | POA: Diagnosis present

## 2022-11-01 LAB — CBC
HCT: 49.9 % (ref 39.0–52.0)
Hemoglobin: 17.4 g/dL — ABNORMAL HIGH (ref 13.0–17.0)
MCH: 30.7 pg (ref 26.0–34.0)
MCHC: 34.9 g/dL (ref 30.0–36.0)
MCV: 88.2 fL (ref 80.0–100.0)
Platelets: 228 10*3/uL (ref 150–400)
RBC: 5.66 MIL/uL (ref 4.22–5.81)
RDW: 12.1 % (ref 11.5–15.5)
WBC: 7 10*3/uL (ref 4.0–10.5)
nRBC: 0 % (ref 0.0–0.2)

## 2022-11-01 LAB — BASIC METABOLIC PANEL
Anion gap: 11 (ref 5–15)
BUN: 14 mg/dL (ref 8–23)
CO2: 20 mmol/L — ABNORMAL LOW (ref 22–32)
Calcium: 8.9 mg/dL (ref 8.9–10.3)
Chloride: 105 mmol/L (ref 98–111)
Creatinine, Ser: 0.78 mg/dL (ref 0.61–1.24)
GFR, Estimated: >60 mL/min (ref >60)
Glucose, Bld: 239 mg/dL — ABNORMAL HIGH (ref 70–99)
Potassium: 4.4 mmol/L (ref 3.5–5.1)
Sodium: 136 mmol/L (ref 135–145)

## 2022-11-01 LAB — TROPONIN I (HIGH SENSITIVITY): Troponin I (High Sensitivity): 8 ng/L (ref <18)

## 2022-11-01 LAB — CBG MONITORING, ED: Glucose-Capillary: 262 mg/dL — ABNORMAL HIGH (ref 70–99)

## 2022-11-01 MED ORDER — NAPROXEN 500 MG PO TABS: 500.0000 mg | ORAL_TABLET | Freq: Two times a day (BID) | ORAL | 2 refills | Status: DC

## 2022-11-01 NOTE — Telephone Encounter (Signed)
  Chief Complaint: numbness- left arm Symptoms: numbness, cold in left arm, shoulder, neck- started 3 days ago Frequency: 3 days Pertinent Negatives: Patient denies headache, dizziness, vision loss, double vision, changes in speech, unsteady on your feet Disposition: [x] ED /[] Urgent Care (no appt availability in office) / [] Appointment(In office/virtual)/ []  [place] [facility]/ [] Home Care/ [] Refused Recommended Disposition /[] [facility]/ []  Follow-up with PCP Additional Notes: Due to ongoing symptoms- ED advised   Reason for Disposition  [1] Numbness (i.e., loss of sensation) of the face, arm / hand, or leg / foot on one side of the body AND [2] sudden onset AND [3] brief (now gone)  Answer Assessment - Initial Assessment Questions 1. SYMPTOM: "What is the main symptom you are concerned about?" (e.g., weakness, numbness)     Numb and cold- left side arm shoulder- down to finger tips 2. ONSET: "When did this start?" (minutes, hours, days; while sleeping)     3 days 3. LAST NORMAL: "When was the last time you (the patient) were normal (no symptoms)?"     3 days 4. PATTERN "Does this come and go, or has it been constant since it started?"  "Is it present now?"     Constant- cold 5. CARDIAC SYMPTOMS: "Have you had any of the following symptoms: chest pain, difficulty breathing, palpitations?"     no 6. NEUROLOGIC SYMPTOMS: "Have you had any of the following symptoms: headache, dizziness, vision loss, double vision, changes in speech, unsteady on your feet?"     no 7. OTHER SYMPTOMS: "Do you have any other symptoms?"     no  Protocols used: Neurologic Deficit-A-AH

## 2022-11-01 NOTE — Telephone Encounter (Signed)
There were multiple appts open in the office today

## 2022-11-01 NOTE — ED Provider Triage Note (Signed)
Emergency Medicine Provider Triage Evaluation Note  Craig Bruce , a 67 y.o. male  was evaluated in triage.  Pt complains of left arm numbness feeling cold x 2 days, started while raking in the yard, tingling/numbness goes to fingers, also started glipizide and is concerned this could be a side effect, denies cp at this time, +smoke.  Review of Systems  Positive:  Negative:   Physical Exam  BP 99/74 (BP Location: Left Arm)   Pulse 88   Temp 98.1 F (36.7 C) (Oral)   Ht 5\' 7"  (1.702 m)   Wt 95.3 kg   SpO2 95%   BMI 32.89 kg/m  Gen:   Awake, no distress   Resp:  Normal effort  MSK:   Moves extremities without difficulty, grips =b/l, decreased radial pulse to palp but present with doppler Other:    Medical Decision Making  Medically screening exam initiated at 11:48 AM.  Appropriate orders placed.  Craig Bruce was informed that the remainder of the evaluation will be completed by another provider, this initial triage assessment does not replace that evaluation, and the importance of remaining in the ED until their evaluation is complete.     Craig Ghee, PA-C 11/01/22 1150

## 2022-11-01 NOTE — ED Triage Notes (Signed)
Pt arrives via POV w/ c/o L arm pain and numbness that moves up to his neck x 2 days. Pt endorses this started while he was using a rake and working in the yard. pt thinks arm pain/numbness is related to his new glipizide rx.  Pt is AO4/GCS15. Pt endorsing some relief with heat and icy hot.   L arm is cooler to touch than R arm  CBG 262

## 2022-11-08 ENCOUNTER — Telehealth: Payer: Self-pay

## 2022-11-08 NOTE — Telephone Encounter (Signed)
Transition Care Management Follow-up Telephone Call Date of discharge and from where: 11/01/2022 [facility] How have you been since you were released from the hospital? Patient is feeling much better. Any questions or concerns? No  Items Reviewed: Did the pt receive and understand the discharge instructions provided? Yes  Medications obtained and verified? Yes  Other? No  Any new allergies since your discharge? No  Dietary orders reviewed? Yes Do you have support at home? Yes   Follow up appointments reviewed:  PCP Hospital f/u appt confirmed? No  Scheduled to see  on  @ . Specialist Hospital f/u appt confirmed? No  Scheduled to see  on  @ . Are transportation arrangements needed? No  If their condition worsens, is the pt aware to call PCP or go to the Emergency Dept.? Yes Was the patient provided with contact information for the PCP's office or ED? Yes Was to pt encouraged to call back with questions or concerns? Yes  Craig [facility]  [facility] Population Health Community Resource Care Guide   [email or web address]  ?? [phone]   Website: [email or web address]

## 2022-11-15 NOTE — ED Provider Notes (Signed)
[facility] Provider Note    Event Date/Time   First MD Initiated Contact with Patient 11/01/22 1245     (approximate)   History   Numbness   HPI  Craig Bruce is a 67 y.o. male treated for diabetes, hypertension who presents with complaints of tingling in his left arm  Which has been ongoing for several days.  He denies chest pain or shortness of breath.  No back pain.  Mild neck discomfort occasionally   Physical Exam   Triage Vital Signs: ED Triage Vitals  Enc Vitals Group     BP 11/01/22 1141 99/74     Pulse Rate 11/01/22 1141 88     Resp 11/01/22 1141 15     Temp 11/01/22 1141 98.1 F (36.7 C)     Temp Source 11/01/22 1141 Oral     SpO2 11/01/22 1141 95 %     Weight 11/01/22 1139 95.3 kg (210 lb)     Height 11/01/22 1139 1.702 m (5\' 7" )     Head Circumference --      Peak Flow --      Pain Score 11/01/22 1140 7     Pain Loc --      Pain Edu? --      Excl. in GC? --     Most recent vital signs: Vitals:   11/01/22 1141  BP: 99/74  Pulse: 88  Resp: 15  Temp: 98.1 F (36.7 C)  SpO2: 95%     General: Awake, no distress.  CV:  Good peripheral perfusion.  Regular rate and rhythm, equal pulses in the upper extremities bilaterally Resp:  Normal effort.  Clear to auscultation bilaterally Abd:  No distention.  Other:  Normal strength in the upper extremities, no neurodeficits   ED Results / Procedures / Treatments   Labs (all labs ordered are listed, but only abnormal results are displayed) Labs Reviewed  BASIC METABOLIC PANEL - Abnormal; Notable for the following components:      Result Value   CO2 20 (*)    Glucose, Bld 239 (*)    All other components within normal limits  CBC - Abnormal; Notable for the following components:   Hemoglobin 17.4 (*)    All other components within normal limits  CBG MONITORING, ED - Abnormal; Notable for the following components:   Glucose-Capillary 262 (*)    All other components within  normal limits  TROPONIN I (HIGH SENSITIVITY)     EKG  ED ECG REPORT I, Jene Every, the attending physician, personally viewed and interpreted this ECG.  Date: 11/15/2022  Rhythm: normal sinus rhythm QRS Axis: normal Intervals: normal ST/T Wave abnormalities: Nonspecific changes Narrative Interpretation: no evidence of acute ischemia    RADIOLOGY Chest x-ray viewed interpret by me, no acute abnormality    PROCEDURES:  Critical Care performed:   Procedures   MEDICATIONS ORDERED IN ED: Medications - No data to display   IMPRESSION / MDM / ASSESSMENT AND PLAN / ED COURSE  I reviewed the triage vital signs and the nursing notes. Patient's presentation is most consistent with acute complicated illness / injury requiring diagnostic workup.  Patient presents with tingling numbness in the left arm intermittently.  Not consistent with CVA or TIA, suspect cervical radiculopathy  Lab work reviewed and is quite reassuring, no evidence of cardiac involvement  Will treat with NSAIDs, outpatient follow-up        FINAL CLINICAL IMPRESSION(S) / ED DIAGNOSES   Final  diagnoses:  Cervical radiculopathy     Rx / DC Orders   ED Discharge Orders          Ordered    naproxen (NAPROSYN) 500 MG tablet  2 times daily with meals,   Status:  Discontinued        11/01/22 1301    naproxen (NAPROSYN) 500 MG tablet  2 times daily with meals,   Status:  Discontinued        11/01/22 1302    naproxen (NAPROSYN) 500 MG tablet  2 times daily with meals        11/01/22 1303             Note:  This document was prepared using Dragon voice recognition software and may include unintentional dictation errors.   Jene Every, MD 11/15/22 [phone]

## 2022-11-30 ENCOUNTER — Ambulatory Visit: Payer: Self-pay | Admitting: *Deleted

## 2022-11-30 NOTE — Telephone Encounter (Signed)
Pt. Called in indicating that someone just tried to call him.    I do not see anywhere in the chart where anyone has tried to contact him since in June 2024. He was seen in the ED on 11/01/2022 and said,  I"m doing fine.    He already has an appt set up with Nicki Reaper, NP for 01/07/2023 at 8:20 that he wrote down as a reminder.  No other needs or requests.

## 2022-12-27 ENCOUNTER — Ambulatory Visit: Payer: Self-pay

## 2022-12-27 NOTE — Telephone Encounter (Signed)
Chief Complaint: Facial Swelling  Symptoms: right side facial swelling Frequency: constant onset 2 days ago Pertinent Negatives: Patient denies toothache, chest pain Disposition: [] ED /[] Urgent Care (no appt availability in office) / [] Appointment(In office/virtual)/ []  [place] [facility]/ [x] Home Care/ [] Refused Recommended Disposition /[] [facility]/ []  Follow-up with PCP Additional Notes: Spoke to [facility] (patient did give consent). Corrie Dandy stated the patient has right facial swelling that is not associated with a toothache. Patient had the same problem a few weeks ago and was treated with medication but they can not remember the name of the medication. Reviewed care advise with patient and Corrie Dandy and they stated they would try antihistamine treatment first and it that does not improve symptoms they will callback. Advised if symptoms get worse to go to UC. Patient and Corrie Dandy verbalized understanding. Reason for Disposition  [1] Mild face swelling (puffiness) AND [2] persists > 3 days  Answer Assessment - Initial Assessment Questions 1. ONSET: "When did the swelling start?" (e.g., minutes, hours, days)     A day or 2 ago 2. LOCATION: "What part of the face is swollen?"     Right side  3. SEVERITY: "How swollen is it?"     Moderate  4. ITCHING: "Is there any itching?" If Yes, ask: "How much?"   (Scale 1-10; mild, moderate or severe)     No 5. PAIN: "Is the swelling painful to touch?" If Yes, ask: "How painful is it?"   (Scale 1-10; mild, moderate or severe)   - NONE (0): no pain   - MILD (1-3): doesn't interfere with normal activities    - MODERATE (4-7): interferes with normal activities or awakens from sleep    - SEVERE (8-10): excruciating pain, unable to do any normal activities      6/10 6. FEVER: "Do you have a fever?" If Yes, ask: "What is it, how was it measured, and when did it start?"      No 7. CAUSE: "What do you think is causing the face swelling?"     We are not sure   8. RECURRENT SYMPTOM: "Have you had face swelling before?" If Yes, ask: "When was the last time?" "What happened that time?"     Yes, a few weeks ago 9. OTHER SYMPTOMS: "Do you have any other symptoms?" (e.g., toothache, leg swelling)     Neck pain,  Protocols used: Face Swelling-A-AH

## 2022-12-28 ENCOUNTER — Telehealth: Payer: Self-pay | Admitting: Internal Medicine

## 2022-12-28 ENCOUNTER — Other Ambulatory Visit: Payer: Self-pay

## 2022-12-28 NOTE — Telephone Encounter (Signed)
Copied from CRM [id number]. Topic: General - Other >> Dec 27, 2022  8:41 AM Franchot Heidelberg wrote: Reason for CRM: Pt's friend Corrie Dandy called requesting for the patient to be seen sooner because he is out of his medication that he needs. Please advise, looking for anything sooner this week.  Please follow up with patient [phone] >> Dec 28, 2022 10:33 AM CMA Danella Maiers wrote: Called patient to try to get more information on what medication he is needing refilled.  Patient could not tell me the medication.  I asked him to call back with the name of the medication and we will send message to Rene Kocher that he has an appointment to get refill approved.

## 2022-12-28 NOTE — Telephone Encounter (Signed)
Left message advising Craig Bruce that we can refill his prescriptions to get it through to his appointment.  We just need to know which ones to refill.  PEC please advise when she calls back.   Thanks,   -[name]

## 2022-12-28 NOTE — Telephone Encounter (Signed)
Mr Craig Bruce called and stated that he was advised by the pharmacist to take Naproxen Sodium 220mg  until he see the doctor at his next up coming appt. Which is January 07, 2023

## 2022-12-28 NOTE — Telephone Encounter (Signed)
Patient and Corrie Dandy are calling- he never got Rx for the Naproxen 500mg :    Disp Refills Start End   naproxen (NAPROSYN) 500 MG tablet 20 tablet 2 11/01/2022 --   Sig - Route: Take 1 tablet (500 mg total) by mouth 2 (two) times daily with a meal. - Oral   Sent to pharmacy as: naproxen (NAPROSYN) 500 MG tablet   E-Prescribing Status: Transmission to pharmacy failed (11/01/2022  1:03 PM EDT)    Patient would like to have this Rx resent if possible -patient does have OTC 220mg  dosing- but would prefer Rx

## 2023-01-07 ENCOUNTER — Ambulatory Visit: Payer: Self-pay

## 2023-01-07 ENCOUNTER — Ambulatory Visit (INDEPENDENT_AMBULATORY_CARE_PROVIDER_SITE_OTHER): Payer: Medicare PPO | Admitting: Internal Medicine

## 2023-01-07 ENCOUNTER — Encounter: Payer: Self-pay | Admitting: Internal Medicine

## 2023-01-07 VITALS — BP 116/70 | HR 84 | Temp 96.6°F | Wt 204.0 lb

## 2023-01-07 DIAGNOSIS — G4709 Other insomnia: Secondary | ICD-10-CM

## 2023-01-07 DIAGNOSIS — I712 Thoracic aortic aneurysm, without rupture, unspecified: Secondary | ICD-10-CM

## 2023-01-07 DIAGNOSIS — E6609 Other obesity due to excess calories: Secondary | ICD-10-CM

## 2023-01-07 DIAGNOSIS — E1165 Type 2 diabetes mellitus with hyperglycemia: Secondary | ICD-10-CM | POA: Diagnosis not present

## 2023-01-07 DIAGNOSIS — E782 Mixed hyperlipidemia: Secondary | ICD-10-CM

## 2023-01-07 DIAGNOSIS — N400 Enlarged prostate without lower urinary tract symptoms: Secondary | ICD-10-CM | POA: Diagnosis not present

## 2023-01-07 DIAGNOSIS — M159 Polyosteoarthritis, unspecified: Secondary | ICD-10-CM | POA: Diagnosis not present

## 2023-01-07 DIAGNOSIS — I700 Atherosclerosis of aorta: Secondary | ICD-10-CM | POA: Diagnosis not present

## 2023-01-07 DIAGNOSIS — Z6831 Body mass index (BMI) 31.0-31.9, adult: Secondary | ICD-10-CM

## 2023-01-07 DIAGNOSIS — I10 Essential (primary) hypertension: Secondary | ICD-10-CM

## 2023-01-07 DIAGNOSIS — E785 Hyperlipidemia, unspecified: Secondary | ICD-10-CM

## 2023-01-07 DIAGNOSIS — E1169 Type 2 diabetes mellitus with other specified complication: Secondary | ICD-10-CM

## 2023-01-07 LAB — POCT GLYCOSYLATED HEMOGLOBIN (HGB A1C): Hemoglobin A1C: 10.9 % — AB (ref 4.0–5.6)

## 2023-01-07 MED ORDER — OZEMPIC (0.25 OR 0.5 MG/DOSE) 2 MG/3ML ~~LOC~~ SOPN: 0.5000 mg | PEN_INJECTOR | SUBCUTANEOUS | 0 refills | Status: DC

## 2023-01-07 MED ORDER — EZETIMIBE 10 MG PO TABS: 10.0000 mg | ORAL_TABLET | Freq: Every day | ORAL | 0 refills | Status: DC

## 2023-01-07 MED ORDER — ROSUVASTATIN CALCIUM 10 MG PO TABS: 10.0000 mg | ORAL_TABLET | Freq: Every day | ORAL | 0 refills | Status: DC

## 2023-01-07 NOTE — Assessment & Plan Note (Signed)
C-Met and lipid profile today I have refilled rosuvastatin and ezetimibe and advised him to take these daily as prescribed Encouraged low-fat diet

## 2023-01-07 NOTE — Progress Notes (Signed)
Subjective:    Patient ID: Craig Bruce, male    DOB: Oct 15, 1955, 67 y.o.   MRN: [id number]  HPI  Patient presents to clinic today for 44-month follow-up of chronic conditions.  DM2: His last A1c was 10.1, 09/2022.  He is not taking glipizide as prescribed.  He is on lisinopril for renal protection.  He does not check his sugars.  He checks his feet routinely.  His last eye exam was 04/2022.  Flu 01/2022.  Pneumovax never.  Prevnar never.  COVID Pfizer x 2.  OA: Mainly in his left knee and shoulders.  He it taking naproxen uses IcyHot as needed with good relief of symptoms.  He does not follow with orthopedics.  Insomnia: He has difficulty staying asleep.  He takes trazodone as needed with some relief of symptoms.  There is no sleep study on file.  HLD with aortic atherosclerosis: His last LDL was 236, triglycerides 389, 09/2022.  He is not taking rosuvastatin and ezetimibe as prescribed.  He is taking aspirin as well.  He does not consume low-fat diet.  BPH: He reports urinary frequency.  He is taking flomax as prescribed.  He does not follow with urology.  Thoracic aortic aneurysm: CT from 07/2021 reviewed.  He does not follow with vascular.  HTN: His BP today is 116/70.  He is taking lisinopril as prescribed.  ECG from 10/2022 reviewed.  Review of Systems     Past Medical History:  Diagnosis Date   Annual physical exam 03/24/2015   Arthritis    BOTH HANDS   Chronic kidney disease    H/O KIDNEY STONES   DVT (deep venous thrombosis) (HCC) 1996   Gastritis    Immunization due 03/24/2015   Need for influenza vaccination 03/24/2015   Obesity (BMI 30.0-34.9) 02/17/2015   Smoker 02/17/2015    Current Outpatient Medications  Medication Sig Dispense Refill   aspirin 81 MG EC tablet Take 1 tablet (81 mg total) by mouth daily. Swallow whole. 30 tablet 12   ezetimibe (ZETIA) 10 MG tablet Take 1 tablet (10 mg total) by mouth daily. 90 tablet 1   glipiZIDE (GLUCOTROL) 10 MG tablet TAKE  0.5 TABLETS BY MOUTH 2 TIMES DAILY BEFORE A MEAL FOR 10 DAYS, THEN 1 TABLET 2 TIMES DAILY 180 tablet 0   lisinopril (ZESTRIL) 5 MG tablet Take 1 tablet (5 mg total) by mouth daily. 90 tablet 1   naproxen (NAPROSYN) 500 MG tablet Take 1 tablet (500 mg total) by mouth 2 (two) times daily with a meal. 20 tablet 2   rosuvastatin (CRESTOR) 10 MG tablet TAKE 1 TABLET BY MOUTH EVERY DAY 90 tablet 0   tamsulosin (FLOMAX) 0.4 MG CAPS capsule Take 1 capsule (0.4 mg total) by mouth daily after breakfast. 90 capsule 0   traZODone (DESYREL) 50 MG tablet TAKE 1 TABLET BY MOUTH AT BEDTIME AS NEEDED FOR SLEEP 90 tablet 1   No current facility-administered medications for this visit.    Allergies  Allergen Reactions   Other     BLOOD THINNER USED 20 YEARS AGO-PT UNSURE EXACTLY OF NAME-CAUSED FEVERS OF 104 PER PT    Family History  Problem Relation Age of Onset   Alcohol abuse Mother    Alcohol abuse Father    Prostate cancer Neg Hx    Kidney cancer Neg Hx    Kidney failure Neg Hx    Colon cancer Neg Hx     Social History   Socioeconomic History  Marital status: Significant Other    Spouse name: Not on file   Number of children: 1   Years of education: Not on file   Highest education level: Not on file  Occupational History   Occupation: retired    Comment: Retired 2022  Tobacco Use   Smoking status: Every Day    Current packs/day: 1.00    Average packs/day: 1 pack/day for 24.0 years (24.0 ttl pk-yrs)    Types: Cigarettes   Smokeless tobacco: Never   Tobacco comments:    Previously smoked 1ppd.  Cutting back and down from 5 cigs / day to 4 cigs per day in 4 weeks.  Vaping Use   Vaping status: Never Used  Substance and Sexual Activity   Alcohol use: Yes    Alcohol/week: 2.0 standard drinks of alcohol    Types: 2 Cans of beer per week    Comment: BEER [facility]   Drug use: No   Sexual activity: Not Currently  Other Topics Concern   Not on file  Social History Narrative   Not on file    Social Determinants of Health   Financial Resource Strain: Low Risk  (07/06/2021)   Overall Financial Resource Strain (CARDIA)    Difficulty of Paying Living Expenses: Not hard at all  Food Insecurity: No Food Insecurity (07/06/2021)   Hunger Vital Sign    Worried About Running Out of Food in the Last Year: Never true    Ran Out of Food in the Last Year: Never true  Transportation Needs: No Transportation Needs (07/06/2021)   PRAPARE - Administrator, Civil Service (Medical): No    Lack of Transportation (Non-Medical): No  Physical Activity: Sufficiently Active (07/06/2021)   Exercise Vital Sign    Days of Exercise per Week: 7 days    Minutes of Exercise per Session: 40 min  Stress: No Stress Concern Present (07/06/2021)   [employer] of Occupational Health - Occupational Stress Questionnaire    Feeling of Stress : Not at all  Social Connections: Socially Integrated (07/06/2021)   Social Connection and Isolation Panel [NHANES]    Frequency of Communication with Friends and Family: Twice a week    Frequency of Social Gatherings with Friends and Family: Once a week    Attends Religious Services: More than 4 times per year    Active Member of [employer] or Organizations: Yes    Attends Banker Meetings: More than 4 times per year    Marital Status: Living with partner  Intimate Partner Violence: Not At Risk (07/06/2021)   Humiliation, Afraid, Rape, and Kick questionnaire    Fear of Current or Ex-Partner: No    Emotionally Abused: No    Physically Abused: No    Sexually Abused: No     Constitutional: Denies fever, malaise, fatigue, headache or abrupt weight changes.  HEENT: Denies eye pain, eye redness, ear pain, ringing in the ears, wax buildup, runny nose, nasal congestion, bloody nose, or sore throat. Respiratory: Denies difficulty breathing, shortness of breath, cough or sputum production.   Cardiovascular: Denies chest pain, chest tightness, palpitations  or swelling in the hands or feet.  Gastrointestinal: Denies abdominal pain, bloating, constipation, diarrhea or blood in the stool.  GU: Patient reports urinary frequency.  Denies urgency, frequency, pain with urination, burning sensation, blood in urine, odor or discharge. Musculoskeletal: Patient reports joint pain.  Denies decrease in range of motion, difficulty with gait, muscle pain or joint pain swelling.  Skin: Denies  redness, rashes, lesions or ulcercations.  Neurological: Patient reports insomnia.  Denies dizziness, difficulty with memory, difficulty with speech or problems with balance and coordination.  Psych: Denies anxiety, depression, SI/HI.  No other specific complaints in a complete review of systems (except as listed in HPI above).  Objective:   Physical Exam  BP 116/70 (BP Location: Right Arm, Patient Position: Sitting, Cuff Size: Normal)   Pulse 84   Temp (!) 96.6 F (35.9 C) (Temporal)   Wt 204 lb (92.5 kg)   SpO2 99%   BMI 31.95 kg/m   Wt Readings from Last 3 Encounters:  11/01/22 210 lb (95.3 kg)  10/04/22 205 lb (93 kg)  06/02/22 206 lb (93.4 kg)    General: Appears his stated age, obese, in NAD. Skin: Warm, dry and intact. No  ulcerations noted. HEENT: Head: normal shape and size; Eyes: sclera white, no icterus, conjunctiva pink, PERRLA and EOMs intact;  Cardiovascular: Normal rate and rhythm. S1,S2 noted.  No murmur, rubs or gallops noted. No JVD or BLE edema. No carotid bruits noted. Pulmonary/Chest: Normal effort and positive vesicular breath sounds. No respiratory distress. No wheezes, rales or ronchi noted.  Abdomen: Normal bowel sounds.  Musculoskeletal: No difficulty with gait.  Neurological: Alert and oriented. Coordination normal.  Psychiatric: Mood and affect normal. Behavior is normal. Judgment and thought content normal.    BMET    Component Value Date/Time   NA 136 11/01/2022 1151   NA 140 03/25/2015 0802   NA 137 05/19/2013 0254   K  4.4 11/01/2022 1151   K 3.9 05/19/2013 0254   CL 105 11/01/2022 1151   CL 107 05/19/2013 0254   CO2 20 (L) 11/01/2022 1151   CO2 25 05/19/2013 0254   GLUCOSE 239 (H) 11/01/2022 1151   GLUCOSE 124 (H) 05/19/2013 0254   BUN 14 11/01/2022 1151   BUN 15 03/25/2015 0802   BUN 14 05/19/2013 0254   CREATININE 0.78 11/01/2022 1151   CREATININE 0.78 10/04/2022 1002   CALCIUM 8.9 11/01/2022 1151   CALCIUM 8.5 05/19/2013 0254   GFRNONAA >60 11/01/2022 1151   GFRNONAA 96 11/03/2017 0821   GFRAA >60 12/09/2018 0500   GFRAA 111 11/03/2017 0821    Lipid Panel     Component Value Date/Time   CHOL 340 (H) 10/04/2022 1002   CHOL 243 (H) 03/25/2015 0802   TRIG 389 (H) 10/04/2022 1002   HDL 38 (L) 10/04/2022 1002   HDL 38 (L) 03/25/2015 0802   CHOLHDL 8.9 (H) 10/04/2022 1002   VLDL 48 (H) 10/01/2016 1140   LDLCALC 236 (H) 10/04/2022 1002    CBC    Component Value Date/Time   WBC 7.0 11/01/2022 1151   RBC 5.66 11/01/2022 1151   HGB 17.4 (H) 11/01/2022 1151   HGB 16.0 03/25/2015 0802   HCT 49.9 11/01/2022 1151   HCT 45.1 03/25/2015 0802   PLT 228 11/01/2022 1151   PLT 190 03/25/2015 0802   MCV 88.2 11/01/2022 1151   MCV 90 03/25/2015 0802   MCV 89 05/19/2013 0254   MCH 30.7 11/01/2022 1151   MCHC 34.9 11/01/2022 1151   RDW 12.1 11/01/2022 1151   RDW 13.4 03/25/2015 0802   RDW 13.2 05/19/2013 0254   LYMPHSABS 2.2 12/09/2018 0500   LYMPHSABS 1.6 03/25/2015 0802   LYMPHSABS 1.4 05/19/2013 0254   MONOABS 0.8 12/09/2018 0500   MONOABS 1.2 (H) 05/19/2013 0254   EOSABS 0.1 12/09/2018 0500   EOSABS 0.2 03/25/2015 0802   EOSABS 0.1  05/19/2013 0254   BASOSABS 0.0 12/09/2018 0500   BASOSABS 0.0 03/25/2015 0802   BASOSABS 0.1 05/19/2013 0254    Hgb A1C Lab Results  Component Value Date   HGBA1C 10.1 (H) 10/04/2022           Assessment & Plan:     RTC in 3 months, follow-up chronic conditions Nicki Reaper, NP

## 2023-01-07 NOTE — Assessment & Plan Note (Signed)
Continue naproxen as needed 

## 2023-01-07 NOTE — Assessment & Plan Note (Signed)
POCT A1c 10.9% Urine microalbumin has been checked within the last year It is unclear if he is actually taking his glipizide as prescribed, discussed the importance of this Ozempic started today, first dose given in office.  He will need to take this medication weekly for 4 weeks then increase to 0.5 mg Encourage low-carb diet and exercise for weight loss Encouraged routine eye exam Encouraged foot exam He declines flu shot today He is he has had his pneumonia vaccines at [facility]

## 2023-01-07 NOTE — Assessment & Plan Note (Signed)
Continue Flomax Will monitor symptoms

## 2023-01-07 NOTE — Assessment & Plan Note (Signed)
Will need to repeat CT at next visit

## 2023-01-07 NOTE — Assessment & Plan Note (Signed)
Encourage diet and exercise for weight loss 

## 2023-01-07 NOTE — Assessment & Plan Note (Signed)
C-Met and lipid profile today It appears that he is not taking rosuvastatin, ezetimibe or aspirin as prescribed, advised him of the importance of taking these medications Encouraged low-fat diet

## 2023-01-07 NOTE — Telephone Encounter (Signed)
Chief Complaint: Medication Question   Disposition: [] ED /[] Urgent Care (no appt availability in office) / [] Appointment(In office/virtual)/ []  [facility]/ [x] Home Care/ [] Refused Recommended Disposition /[] [place] [facility]/ []  Follow-up with PCP Additional Notes: Spoke to patient and Corrie Dandy who wanted to know how often the patient should be taking the new Rx Ozempic that was prescribed today. Advised patient that he will take the Ozempic shot weekly as ordered by provider. Patient and Corrie Dandy verbalized understanding.  Reason for Disposition  Caller has medicine question only, adult not sick, AND triager answers question  Answer Assessment - Initial Assessment Questions 1. NAME of MEDICINE: "What medicine(s) are you calling about?"     Ozempic  2. QUESTION: "What is your question?" (e.g., double dose of medicine, side effect)     How often is he suppose to take his injection? 3. PRESCRIBER: "Who prescribed the medicine?" Reason: if prescribed by specialist, call should be referred to that group.     Rene Kocher, NP 4. SYMPTOMS: "Do you have any symptoms?" If Yes, ask: "What symptoms are you having?"  "How bad are the symptoms (e.g., mild, moderate, severe)     None  Protocols used: Medication Question Call-A-AH

## 2023-01-07 NOTE — Patient Instructions (Signed)

## 2023-01-07 NOTE — Assessment & Plan Note (Signed)
Controlled on lisinopril Reinforced DASH diet and exercise for weight loss C-Met today 

## 2023-01-07 NOTE — Assessment & Plan Note (Signed)
Continue trazodone 

## 2023-01-08 LAB — COMPLETE METABOLIC PANEL WITH GFR
AG Ratio: 1.6 (calc) (ref 1.0–2.5)
ALT: 25 U/L (ref 9–46)
AST: 18 U/L (ref 10–35)
Albumin: 4.1 g/dL (ref 3.6–5.1)
Alkaline phosphatase (APISO): 85 U/L (ref 35–144)
BUN: 14 mg/dL (ref 7–25)
CO2: 26 mmol/L (ref 20–32)
Calcium: 9.2 mg/dL (ref 8.6–10.3)
Chloride: 99 mmol/L (ref 98–110)
Creat: 0.81 mg/dL (ref 0.70–1.35)
Globulin: 2.6 g/dL (ref 1.9–3.7)
Glucose, Bld: 315 mg/dL — ABNORMAL HIGH (ref 65–99)
Potassium: 4.4 mmol/L (ref 3.5–5.3)
Sodium: 136 mmol/L (ref 135–146)
Total Bilirubin: 1 mg/dL (ref 0.2–1.2)
Total Protein: 6.7 g/dL (ref 6.1–8.1)
eGFR: 96 mL/min/{1.73_m2} (ref >=60)

## 2023-01-08 LAB — LIPID PANEL
Cholesterol: 274 mg/dL — ABNORMAL HIGH (ref <200)
HDL: 45 mg/dL (ref >=40)
LDL Cholesterol (Calc): 185 mg/dL — ABNORMAL HIGH
Non-HDL Cholesterol (Calc): 229 mg/dL — ABNORMAL HIGH (ref <130)
Total CHOL/HDL Ratio: 6.1 (calc) — ABNORMAL HIGH (ref <5.0)
Triglycerides: 239 mg/dL — ABNORMAL HIGH (ref <150)

## 2023-01-08 LAB — CBC
HCT: 52.6 % — ABNORMAL HIGH (ref 38.5–50.0)
Hemoglobin: 17.7 g/dL — ABNORMAL HIGH (ref 13.2–17.1)
MCH: 30.9 pg (ref 27.0–33.0)
MCHC: 33.7 g/dL (ref 32.0–36.0)
MCV: 91.8 fL (ref 80.0–100.0)
MPV: 10.2 fL (ref 7.5–12.5)
Platelets: 209 10*3/uL (ref 140–400)
RBC: 5.73 10*6/uL (ref 4.20–5.80)
RDW: 12.3 % (ref 11.0–15.0)
WBC: 7 10*3/uL (ref 3.8–10.8)

## 2023-01-11 MED ORDER — ROSUVASTATIN CALCIUM 20 MG PO TABS: 20.0000 mg | ORAL_TABLET | Freq: Every day | ORAL | 1 refills | Status: DC

## 2023-01-11 NOTE — Addendum Note (Signed)
Addended by: Lorre Munroe on: 01/11/2023 01:10 PM   Modules accepted: Orders

## 2023-03-17 ENCOUNTER — Ambulatory Visit (INDEPENDENT_AMBULATORY_CARE_PROVIDER_SITE_OTHER): Payer: Medicare HMO

## 2023-03-17 DIAGNOSIS — Z0000 Encounter for general adult medical examination without abnormal findings: Secondary | ICD-10-CM | POA: Diagnosis not present

## 2023-03-17 NOTE — Progress Notes (Signed)
Subjective:   Craig Bruce is a 67 y.o. male who presents for Medicare Annual/Subsequent preventive examination.  Visit Complete: Virtual I connected with  Thomasene Ripple on 03/17/23 by a audio enabled telemedicine application and verified that I am speaking with the correct person using two identifiers.  Patient Location: Home  Provider Location: Office/Clinic  I discussed the limitations of evaluation and management by telemedicine. The patient expressed understanding and agreed to proceed.  Vital Signs: Because this visit was a virtual/telehealth visit, some criteria may be missing or patient reported. Any vitals not documented were not able to be obtained and vitals that have been documented are patient reported.   Cardiac Risk Factors include: advanced age (>73men, >12 women);dyslipidemia;hypertension;male gender;obesity (BMI >30kg/m2)     Objective:    There were no vitals filed for this visit. There is no height or weight on file to calculate BMI.     03/17/2023    1:12 PM 11/01/2022   11:48 AM 09/11/2020    8:45 AM 06/29/2019    1:43 AM 02/26/2018    7:35 PM 11/23/2017   10:01 AM 06/25/2015   11:08 AM  Advanced Directives  Does Patient Have a Medical Advance Directive? No No;Yes No No No No No  Type of Advance Directive  Living will       Would patient like information on creating a medical advance directive? No - Patient declined  No - Patient declined No - Patient declined  No - Patient declined No - patient declined information    Current Medications (verified) Outpatient Encounter Medications as of 03/17/2023  Medication Sig   ezetimibe (ZETIA) 10 MG tablet Take 1 tablet (10 mg total) by mouth daily.   glipiZIDE (GLUCOTROL) 10 MG tablet TAKE 0.5 TABLETS BY MOUTH 2 TIMES DAILY BEFORE A MEAL FOR 10 DAYS, THEN 1 TABLET 2 TIMES DAILY   lisinopril (ZESTRIL) 5 MG tablet Take 1 tablet (5 mg total) by mouth daily.   naproxen (NAPROSYN) 250 MG tablet Take by mouth 2 (two)  times daily with a meal.   OZEMPIC, 0.25 OR 0.5 MG/DOSE, 2 MG/3ML SOPN Inject 0.5 mg into the skin once a week. Start with 0.25mg  weekly x 4 weeks then increase to 0.5mg  weekly injection.   rosuvastatin (CRESTOR) 20 MG tablet Take 1 tablet (20 mg total) by mouth daily.   tamsulosin (FLOMAX) 0.4 MG CAPS capsule Take 1 capsule (0.4 mg total) by mouth daily after breakfast.   traZODone (DESYREL) 50 MG tablet TAKE 1 TABLET BY MOUTH AT BEDTIME AS NEEDED FOR SLEEP   aspirin 81 MG EC tablet Take 1 tablet (81 mg total) by mouth daily. Swallow whole. (Patient not taking: Reported on 03/17/2023)   No facility-administered encounter medications on file as of 03/17/2023.    Allergies (verified) Other   History: Past Medical History:  Diagnosis Date   Annual physical exam 03/24/2015   Arthritis    BOTH HANDS   Chronic kidney disease    H/O KIDNEY STONES   DVT (deep venous thrombosis) (HCC) 1996   Gastritis    Immunization due 03/24/2015   Need for influenza vaccination 03/24/2015   Obesity (BMI 30.0-34.9) 02/17/2015   Smoker 02/17/2015   Past Surgical History:  Procedure Laterality Date   COLONOSCOPY WITH PROPOFOL N/A 09/11/2020   Procedure: COLONOSCOPY WITH PROPOFOL;  Surgeon: Wyline Mood, MD;  Location: [facility] ENDOSCOPY;  Service: Gastroenterology;  Laterality: N/A;   ORCHIECTOMY Right 06/25/2015   Procedure: ORCHIECTOMY;  Surgeon: Cloyde Reams  Sherryl Barters, MD;  Location: [facility] ORS;  Service: Urology;  Laterality: Right;   WISDOM TOOTH EXTRACTION     Family History  Problem Relation Age of Onset   Alcohol abuse Mother    Alcohol abuse Father    Prostate cancer Neg Hx    Kidney cancer Neg Hx    Kidney failure Neg Hx    Colon cancer Neg Hx    Social History   Socioeconomic History   Marital status: Significant Other    Spouse name: Not on file   Number of children: 1   Years of education: Not on file   Highest education level: Not on file  Occupational History   Occupation: retired     Comment: Retired 2022  Tobacco Use   Smoking status: Every Day    Current packs/day: 1.00    Average packs/day: 1 pack/day for 24.0 years (24.0 ttl pk-yrs)    Types: Cigarettes   Smokeless tobacco: Never   Tobacco comments:    Previously smoked 1ppd.  Cutting back and down from 5 cigs / day to 4 cigs per day in 4 weeks.  Vaping Use   Vaping status: Never Used  Substance and Sexual Activity   Alcohol use: Yes    Alcohol/week: 2.0 standard drinks of alcohol    Types: 2 Cans of beer per week    Comment: BEER OCC   Drug use: No   Sexual activity: Not Currently  Other Topics Concern   Not on file  Social History Narrative   Not on file   Social Determinants of Health   Financial Resource Strain: Low Risk  (03/17/2023)   Overall Financial Resource Strain (CARDIA)    Difficulty of Paying Living Expenses: Not hard at all  Food Insecurity: No Food Insecurity (03/17/2023)   Hunger Vital Sign    Worried About Running Out of Food in the Last Year: Never true    Ran Out of Food in the Last Year: Never true  Transportation Needs: No Transportation Needs (03/17/2023)   PRAPARE - Administrator, Civil Service (Medical): No    Lack of Transportation (Non-Medical): No  Physical Activity: Sufficiently Active (03/17/2023)   Exercise Vital Sign    Days of Exercise per Week: 7 days    Minutes of Exercise per Session: 40 min  Stress: No Stress Concern Present (03/17/2023)   [employer] of Occupational Health - Occupational Stress Questionnaire    Feeling of Stress : Not at all  Social Connections: Moderately Isolated (03/17/2023)   Social Connection and Isolation Panel [NHANES]    Frequency of Communication with Friends and Family: Twice a week    Frequency of Social Gatherings with Friends and Family: Never    Attends Religious Services: More than 4 times per year    Active Member of [employer] or Organizations: No    Attends Banker Meetings: Never    Marital  Status: Living with partner    Tobacco Counseling Ready to quit: Not Answered Counseling given: Not Answered Tobacco comments: Previously smoked 1ppd.  Cutting back and down from 5 cigs / day to 4 cigs per day in 4 weeks.   Clinical Intake:  Pre-visit preparation completed: Yes  Pain : No/denies pain     Nutritional Status: BMI > 30  Obese Nutritional Risks: None Diabetes: Yes CBG done?: No Did pt. bring in CBG monitor from home?: No  How often do you need to have someone help you when you read instructions,  pamphlets, or other written materials from your doctor or pharmacy?: 1 - Never  Interpreter Needed?: No  Information entered by :: Kennedy Bucker, LPN   Activities of Daily Living    03/17/2023    1:13 PM 01/07/2023    8:47 AM  In your present state of health, do you have any difficulty performing the following activities:  Hearing? 0 0  Vision? 0 0  Difficulty concentrating or making decisions? 0 1  Walking or climbing stairs? 0 0  Dressing or bathing? 0 0  Doing errands, shopping? 0 0  Preparing Food and eating ? N   Using the Toilet? N   In the past six months, have you accidently leaked urine? N   Do you have problems with loss of bowel control? N   Managing your Medications? N   Managing your Finances? N   Housekeeping or managing your Housekeeping? N     Patient Care Team: Lorre Munroe, NP as PCP - General (Internal Medicine) Isla Pence, OD (Optometry)  Indicate any recent Medical Services you may have received from other than [facility] providers in the past year (date may be approximate).     Assessment:   This is a routine wellness examination for [place].  Hearing/Vision screen Hearing Screening - Comments:: No aids Vision Screening - Comments:: Wears glasses- Dr.Woodard   Goals Addressed             This Visit's Progress    DIET - EAT MORE FRUITS AND VEGETABLES         Depression Screen    03/17/2023    1:10 PM 01/07/2023     8:46 AM 10/04/2022    9:54 AM 07/06/2021   11:35 AM 07/02/2021    8:52 AM 12/25/2020    8:13 AM 09/24/2020   11:13 AM  PHQ 2/9 Scores  PHQ - 2 Score 0 1 0 0 0 3 0  PHQ- 9 Score 0   3 3 11 8     Fall Risk    03/17/2023    1:13 PM 01/07/2023    8:47 AM 10/04/2022    9:54 AM 07/06/2021   11:43 AM 07/02/2021    8:52 AM  Fall Risk   Falls in the past year? 0 0 0 0 0  Number falls in past yr: 0   0 0  Injury with Fall? 0 0 0 0 0  Risk for fall due to : No Fall Risks No Fall Risks No Fall Risks No Fall Risks No Fall Risks  Follow up Falls prevention discussed;Falls evaluation completed   Falls evaluation completed Falls evaluation completed    MEDICARE RISK AT HOME: Medicare Risk at Home Any stairs in or around the home?: Yes If so, are there any without handrails?: No Home free of loose throw rugs in walkways, pet beds, electrical cords, etc?: Yes Adequate lighting in your home to reduce risk of falls?: Yes Life alert?: No Use of a cane, walker or w/c?: No Grab bars in the bathroom?: No Shower chair or bench in shower?: No Elevated toilet seat or a handicapped toilet?: No  TIMED UP AND GO:  Was the test performed?  No    Cognitive Function:        03/17/2023    1:14 PM 07/06/2021   11:43 AM  6CIT Screen  What Year? 0 points 0 points  What month? 0 points 0 points  What time? 0 points 0 points  Count back from 20 0  points 0 points  Months in reverse 0 points 0 points  Repeat phrase 10 points 0 points  Total Score 10 points 0 points    Immunizations Immunization History  Administered Date(s) Administered   Influenza, High Dose Seasonal PF 01/11/2021   Influenza,inj,Quad PF,6+ Mos 03/24/2015, 04/11/2017, 01/13/2019   Influenza-Unspecified 03/24/2015, 01/20/2021, 04/23/2021   PFIZER(Purple Top)SARS-COV-2 Vaccination 08/24/2019, 09/14/2019   Tdap 03/24/2015    TDAP status: Up to date  Flu Vaccine status: Declined, Education has been provided regarding the importance of  this vaccine but patient still declined. Advised may receive this vaccine at local pharmacy or Health Dept. Aware to provide a copy of the vaccination record if obtained from local pharmacy or Health Dept. Verbalized acceptance and understanding.  Pneumococcal vaccine status: Declined,  Education has been provided regarding the importance of this vaccine but patient still declined. Advised may receive this vaccine at local pharmacy or Health Dept. Aware to provide a copy of the vaccination record if obtained from local pharmacy or Health Dept. Verbalized acceptance and understanding.   Covid-19 vaccine status: Completed vaccines  Qualifies for Shingles Vaccine? Yes   Zostavax completed No   Shingrix Completed?: No.    Education has been provided regarding the importance of this vaccine. Patient has been advised to call insurance company to determine out of pocket expense if they have not yet received this vaccine. Advised may also receive vaccine at local pharmacy or Health Dept. Verbalized acceptance and understanding.  Screening Tests Health Maintenance  Topic Date Due   Zoster Vaccines- Shingrix (1 of 2) Never done   INFLUENZA VACCINE  08/22/2023 (Originally 12/23/2022)   Lung Cancer Screening  10/04/2023 (Originally 08/04/2022)   Pneumonia Vaccine 76+ Years old (1 of 2 - PCV) 10/04/2023 (Originally 12/12/1961)   OPHTHALMOLOGY EXAM  05/05/2023   Diabetic kidney evaluation - Urine ACR  06/03/2023   FOOT EXAM  06/03/2023   HEMOGLOBIN A1C  07/10/2023   Colonoscopy  09/12/2023   Diabetic kidney evaluation - eGFR measurement  01/07/2024   Medicare Annual Wellness (AWV)  03/16/2024   DTaP/Tdap/Td (2 - Td or Tdap) 03/23/2025   Hepatitis C Screening  Completed   HPV VACCINES  Aged Out   COVID-19 Vaccine  Discontinued   Fecal DNA (Cologuard)  Discontinued    Health Maintenance  Health Maintenance Due  Topic Date Due   Zoster Vaccines- Shingrix (1 of 2) Never done    Colorectal cancer  screening: Type of screening: Colonoscopy. Completed 09/11/20. Repeat every 3 years  Lung Cancer Screening: (Low Dose CT Chest recommended if Age 28-80 years, 20 pack-year currently smoking OR have quit w/in 15years.) does qualify.   Lung Cancer Screening Referral: CT scan 08/03/21  Additional Screening:  Hepatitis C Screening: does qualify; Completed 10/01/16  Vision Screening: Recommended annual ophthalmology exams for early detection of glaucoma and other disorders of the eye. Is the patient up to date with their annual eye exam?  Yes  Who is the provider or what is the name of the office in which the patient attends annual eye exams? Dr.Woodard If pt is not established with a provider, would they like to be referred to a provider to establish care? No .   Dental Screening: Recommended annual dental exams for proper oral hygiene  Diabetic Foot Exam: Diabetic Foot Exam: Completed 06/02/22  Community Resource Referral / Chronic Care Management: CRR required this visit?  No   CCM required this visit?  No     Plan:  I have personally reviewed and noted the following in the patient's chart:   Medical and social history Use of alcohol, tobacco or illicit drugs  Current medications and supplements including opioid prescriptions. Patient is not currently taking opioid prescriptions. Functional ability and status Nutritional status Physical activity Advanced directives List of other physicians Hospitalizations, surgeries, and ER visits in previous 12 months Vitals Screenings to include cognitive, depression, and falls Referrals and appointments  In addition, I have reviewed and discussed with patient certain preventive protocols, quality metrics, and best practice recommendations. A written personalized care plan for preventive services as well as general preventive health recommendations were provided to patient.     Hal Hope, LPN   27/25/3664   After Visit Summary:  (MyChart) Due to this being a telephonic visit, the after visit summary with patients personalized plan was offered to patient via MyChart   Nurse Notes: none

## 2023-03-17 NOTE — Patient Instructions (Addendum)
Mr. Craig Bruce , Thank you for taking time to come for your Medicare Wellness Visit. I appreciate your ongoing commitment to your health goals. Please review the following plan we discussed and let me know if I can assist you in the future.   Referrals/Orders/Follow-Ups/Clinician Recommendations: none  This is a list of the screening recommended for you and due dates:  Health Maintenance  Topic Date Due   Zoster (Shingles) Vaccine (1 of 2) Never done   Flu Shot  08/22/2023*   Screening for Lung Cancer  10/04/2023*   Pneumonia Vaccine (1 of 2 - PCV) 10/04/2023*   Eye exam for diabetics  05/05/2023   Yearly kidney health urinalysis for diabetes  06/03/2023   Complete foot exam   06/03/2023   Hemoglobin A1C  07/10/2023   Colon Cancer Screening  09/12/2023   Yearly kidney function blood test for diabetes  01/07/2024   Medicare Annual Wellness Visit  03/16/2024   DTaP/Tdap/Td vaccine (2 - Td or Tdap) 03/23/2025   Hepatitis C Screening  Completed   HPV Vaccine  Aged Out   COVID-19 Vaccine  Discontinued   Cologuard (Stool DNA test)  Discontinued  *Topic was postponed. The date shown is not the original due date.    Advanced directives: (ACP Link)Information on Advanced Care Planning can be found at [facility] Directives Advance Health Care Directives ([email or web address]   Next Medicare Annual Wellness Visit scheduled for next year: Yes   03/23/24 @ 9:25 am by phone

## 2023-04-03 ENCOUNTER — Other Ambulatory Visit: Payer: Self-pay | Admitting: Internal Medicine

## 2023-04-04 NOTE — Telephone Encounter (Signed)
Requested Prescriptions  Pending Prescriptions Disp Refills   traZODone (DESYREL) 50 MG tablet [Pharmacy Med Name: TRAZODONE 50 MG TABLET] 90 tablet 1    Sig: TAKE 1 TABLET BY MOUTH EVERY DAY AT BEDTIME AS NEEDED FOR SLEEP     Psychiatry: Antidepressants - Serotonin Modulator Passed - 04/03/2023  8:52 AM      Passed - Valid encounter within last 6 months    Recent Outpatient Visits           2 months ago Type 2 diabetes mellitus with hyperglycemia, without long-term current use of insulin [facility])   [place] [facility] Bryce, Salvadore Oxford, NP   6 months ago Encounter for general adult medical examination with abnormal findings   [place] [facility] [place], [place] W, NP   10 months ago Type 2 diabetes mellitus with hyperglycemia, without long-term current use of insulin [facility])   [place] [facility] [place], Salvadore Oxford, NP   1 year ago Encounter for general adult medical examination with abnormal findings   [place] [facility] Jermyn, Salvadore Oxford, NP   2 years ago Chronic right shoulder pain   [place] [facility] [place], Salvadore Oxford, NP       Future Appointments             In 1 week Sampson Si, Salvadore Oxford, NP  [facility], [facility]

## 2023-04-11 ENCOUNTER — Encounter: Payer: Self-pay | Admitting: Internal Medicine

## 2023-04-11 ENCOUNTER — Ambulatory Visit (INDEPENDENT_AMBULATORY_CARE_PROVIDER_SITE_OTHER): Payer: Medicare HMO | Admitting: Internal Medicine

## 2023-04-11 VITALS — BP 120/78 | HR 83 | Ht 67.0 in | Wt 202.2 lb

## 2023-04-11 DIAGNOSIS — E1165 Type 2 diabetes mellitus with hyperglycemia: Secondary | ICD-10-CM | POA: Diagnosis not present

## 2023-04-11 DIAGNOSIS — I700 Atherosclerosis of aorta: Secondary | ICD-10-CM

## 2023-04-11 DIAGNOSIS — N400 Enlarged prostate without lower urinary tract symptoms: Secondary | ICD-10-CM | POA: Diagnosis not present

## 2023-04-11 DIAGNOSIS — I10 Essential (primary) hypertension: Secondary | ICD-10-CM | POA: Diagnosis not present

## 2023-04-11 DIAGNOSIS — E785 Hyperlipidemia, unspecified: Secondary | ICD-10-CM

## 2023-04-11 DIAGNOSIS — I712 Thoracic aortic aneurysm, without rupture, unspecified: Secondary | ICD-10-CM

## 2023-04-11 DIAGNOSIS — E66811 Obesity, class 1: Secondary | ICD-10-CM

## 2023-04-11 DIAGNOSIS — G4709 Other insomnia: Secondary | ICD-10-CM

## 2023-04-11 DIAGNOSIS — E6609 Other obesity due to excess calories: Secondary | ICD-10-CM

## 2023-04-11 DIAGNOSIS — F17200 Nicotine dependence, unspecified, uncomplicated: Secondary | ICD-10-CM

## 2023-04-11 DIAGNOSIS — J432 Centrilobular emphysema: Secondary | ICD-10-CM

## 2023-04-11 DIAGNOSIS — Z6831 Body mass index (BMI) 31.0-31.9, adult: Secondary | ICD-10-CM

## 2023-04-11 DIAGNOSIS — E1169 Type 2 diabetes mellitus with other specified complication: Secondary | ICD-10-CM

## 2023-04-11 DIAGNOSIS — M150 Primary generalized (osteo)arthritis: Secondary | ICD-10-CM

## 2023-04-11 DIAGNOSIS — J439 Emphysema, unspecified: Secondary | ICD-10-CM | POA: Insufficient documentation

## 2023-04-11 NOTE — Assessment & Plan Note (Signed)
C-Met and lipid profile today Encouraged him to continue rosuvastatin and ezetimibe Encouraged low-fat diet

## 2023-04-11 NOTE — Assessment & Plan Note (Signed)
Encourage smoking cessation CT lung cancer screening ordered

## 2023-04-11 NOTE — Assessment & Plan Note (Signed)
C-Met and lipid profile today Continue rosuvastatin, ezetimibe or aspirin as prescribed. Encouraged low-fat diet

## 2023-04-11 NOTE — Assessment & Plan Note (Signed)
Will discontinue trazodone due to nonuse

## 2023-04-11 NOTE — Assessment & Plan Note (Signed)
CTA chest ordered.

## 2023-04-11 NOTE — Assessment & Plan Note (Signed)
Controlled on lisinopril Reinforced DASH diet and exercise for weight loss C-Met today 

## 2023-04-11 NOTE — Assessment & Plan Note (Signed)
A1c and urine microalbumin today Noncompliant with glipizide, will discontinue due to nonuse Continue Ozempic Encourage low-carb diet and exercise for weight loss Encouraged routine eye exam Encouraged foot exam He reports he [facility] his immunizations at [facility]

## 2023-04-11 NOTE — Progress Notes (Signed)
Subjective:    Patient ID: Craig Bruce, male    DOB: [birth year], 67 y.o.   MRN: [id number]  HPI  Patient presents to clinic today for 95-month follow-up of chronic conditions.  DM2: His last A1c was 10.9, 12/2022.  He is not taking glipizide but is taking ozempic as prescribed. He does not feel not he needs this. He is on lisinopril for renal protection.  He does not check his sugars.  He checks his feet routinely.  His last eye exam was 04/2022.  Flu 01/2023.  Pneumovax unsure.  Prevnar unsure.  COVID Pfizer x 2.  OA: He denies any joint pain at this time. He is not taking any medications for this. He does not follow with orthopedics.  Insomnia: He has difficulty staying asleep.  He is not taking trazodone due to ineffectiveness.  There is no sleep study on file.  HLD with aortic atherosclerosis: His last LDL was 185, triglycerides 239, 12/2022.  He is taking rosuvastatin and ezetimibe as prescribed.  He is taking aspirin as well.  He tries to consume low-fat diet.  BPH: He reports urinary frequency.  He is taking flomax as prescribed.  He does not follow with urology.  Thoracic aortic aneurysm: CT from 07/2021 reviewed.  He does not follow with vascular.  HTN: His BP today is 120/78.  He is taking lisinopril as prescribed.  ECG from 10/2022 reviewed.  Emphysema: He denies chronic cough or shortness of breath.  He does continue to smoke.  He is not using any inhalers at this time.  CT lung cancer screening from 07/2021 reviewed.  Review of Systems     Past Medical History:  Diagnosis Date   Annual physical exam 03/24/2015   Arthritis    BOTH HANDS   Chronic kidney disease    H/O KIDNEY STONES   DVT (deep venous thrombosis) (HCC) 1996   Gastritis    Immunization due 03/24/2015   Need for influenza vaccination 03/24/2015   Obesity (BMI 30.0-34.9) 02/17/2015   Smoker 02/17/2015    Current Outpatient Medications  Medication Sig Dispense Refill   aspirin 81 MG EC tablet Take 1  tablet (81 mg total) by mouth daily. Swallow whole. (Patient not taking: Reported on 03/17/2023) 30 tablet 12   ezetimibe (ZETIA) 10 MG tablet Take 1 tablet (10 mg total) by mouth daily. 90 tablet 0   glipiZIDE (GLUCOTROL) 10 MG tablet TAKE 0.5 TABLETS BY MOUTH 2 TIMES DAILY BEFORE A MEAL FOR 10 DAYS, THEN 1 TABLET 2 TIMES DAILY 180 tablet 0   lisinopril (ZESTRIL) 5 MG tablet Take 1 tablet (5 mg total) by mouth daily. 90 tablet 1   naproxen (NAPROSYN) 250 MG tablet Take by mouth 2 (two) times daily with a meal.     OZEMPIC, 0.25 OR 0.5 MG/DOSE, 2 MG/3ML SOPN Inject 0.5 mg into the skin once a week. Start with 0.25mg  weekly x 4 weeks then increase to 0.5mg  weekly injection. 9 mL 0   rosuvastatin (CRESTOR) 20 MG tablet Take 1 tablet (20 mg total) by mouth daily. 90 tablet 1   tamsulosin (FLOMAX) 0.4 MG CAPS capsule Take 1 capsule (0.4 mg total) by mouth daily after breakfast. 90 capsule 0   traZODone (DESYREL) 50 MG tablet TAKE 1 TABLET BY MOUTH EVERY DAY AT BEDTIME AS NEEDED FOR SLEEP 90 tablet 1   No current facility-administered medications for this visit.    Allergies  Allergen Reactions   Other     BLOOD THINNER  USED 20 YEARS AGO-PT UNSURE EXACTLY OF NAME-CAUSED FEVERS OF 104 PER PT    Family History  Problem Relation Age of Onset   Alcohol abuse Mother    Alcohol abuse Father    Prostate cancer Neg Hx    Kidney cancer Neg Hx    Kidney failure Neg Hx    Colon cancer Neg Hx     Social History   Socioeconomic History   Marital status: Significant Other    Spouse name: Not on file   Number of children: 1   Years of education: Not on file   Highest education level: Not on file  Occupational History   Occupation: retired    Comment: Retired 2022  Tobacco Use   Smoking status: Every Day    Current packs/day: 1.00    Average packs/day: 1 pack/day for 24.0 years (24.0 ttl pk-yrs)    Types: Cigarettes   Smokeless tobacco: Never   Tobacco comments:    Previously smoked 1ppd.   Cutting back and down from 5 cigs / day to 4 cigs per day in 4 weeks.  Vaping Use   Vaping status: Never Used  Substance and Sexual Activity   Alcohol use: Yes    Alcohol/week: 2.0 standard drinks of alcohol    Types: 2 Cans of beer per week    Comment: BEER OCC   Drug use: No   Sexual activity: Not Currently  Other Topics Concern   Not on file  Social History Narrative   Not on file   Social Determinants of Health   Financial Resource Strain: Low Risk  (03/17/2023)   Overall Financial Resource Strain (CARDIA)    Difficulty of Paying Living Expenses: Not hard at all  Food Insecurity: No Food Insecurity (03/17/2023)   Hunger Vital Sign    Worried About Running Out of Food in the Last Year: Never true    Ran Out of Food in the Last Year: Never true  Transportation Needs: No Transportation Needs (03/17/2023)   PRAPARE - Administrator, Civil Service (Medical): No    Lack of Transportation (Non-Medical): No  Physical Activity: Sufficiently Active (03/17/2023)   Exercise Vital Sign    Days of Exercise per Week: 7 days    Minutes of Exercise per Session: 40 min  Stress: No Stress Concern Present (03/17/2023)   [employer] of Occupational Health - Occupational Stress Questionnaire    Feeling of Stress : Not at all  Social Connections: Moderately Isolated (03/17/2023)   Social Connection and Isolation Panel [NHANES]    Frequency of Communication with Friends and Family: Twice a week    Frequency of Social Gatherings with Friends and Family: Never    Attends Religious Services: More than 4 times per year    Active Member of [employer] or Organizations: No    Attends Banker Meetings: Never    Marital Status: Living with partner  Intimate Partner Violence: Not At Risk (03/17/2023)   Humiliation, Afraid, Rape, and Kick questionnaire    Fear of Current or Ex-Partner: No    Emotionally Abused: No    Physically Abused: No    Sexually Abused: No      Constitutional: Denies fever, malaise, fatigue, headache or abrupt weight changes.  HEENT: Denies eye pain, eye redness, ear pain, ringing in the ears, wax buildup, runny nose, nasal congestion, bloody nose, or sore throat. Respiratory: Denies difficulty breathing, shortness of breath, cough or sputum production.   Cardiovascular: Denies chest pain,  chest tightness, palpitations or swelling in the hands or feet.  Gastrointestinal: Denies abdominal pain, bloating, constipation, diarrhea or blood in the stool.  GU: Patient reports urinary frequency.  Denies urgency, frequency, pain with urination, burning sensation, blood in urine, odor or discharge. Musculoskeletal: Denies decrease in range of motion, difficulty with gait, muscle pain or joint pain orswelling.  Skin: Denies redness, rashes, lesions or ulcercations.  Neurological: Patient reports insomnia.  Denies dizziness, difficulty with memory, difficulty with speech or problems with balance and coordination.  Psych: Denies anxiety, depression, SI/HI.  No other specific complaints in a complete review of systems (except as listed in HPI above).  Objective:   Physical Exam  BP 120/78   Pulse 83   Ht 5\' 7"  (1.702 m)   Wt 202 lb 3.2 oz (91.7 kg)   SpO2 98%   BMI 31.67 kg/m    Wt Readings from Last 3 Encounters:  01/07/23 204 lb (92.5 kg)  11/01/22 210 lb (95.3 kg)  10/04/22 205 lb (93 kg)    General: Appears his stated age, obese, in NAD. Skin: Warm, dry and intact. No  ulcerations noted. HEENT: Head: normal shape and size; Eyes: sclera white, no icterus, conjunctiva pink, PERRLA and EOMs intact;  Cardiovascular: Normal rate and rhythm. S1,S2 noted.  No murmur, rubs or gallops noted. No JVD or BLE edema. No carotid bruits noted. Pulmonary/Chest: Normal effort and diminished breath sounds. No respiratory distress. No wheezes, rales or ronchi noted.  Abdomen: Normal bowel sounds.  Musculoskeletal: No difficulty with gait.   Neurological: Alert and oriented. Coordination normal.  Psychiatric: Mood and affect normal. Behavior is normal. Judgment and thought content normal.    BMET    Component Value Date/Time   NA 136 01/07/2023 0849   NA 140 03/25/2015 0802   NA 137 05/19/2013 0254   K 4.4 01/07/2023 0849   K 3.9 05/19/2013 0254   CL 99 01/07/2023 0849   CL 107 05/19/2013 0254   CO2 26 01/07/2023 0849   CO2 25 05/19/2013 0254   GLUCOSE 315 (H) 01/07/2023 0849   GLUCOSE 124 (H) 05/19/2013 0254   BUN 14 01/07/2023 0849   BUN 15 03/25/2015 0802   BUN 14 05/19/2013 0254   CREATININE 0.81 01/07/2023 0849   CALCIUM 9.2 01/07/2023 0849   CALCIUM 8.5 05/19/2013 0254   GFRNONAA >60 11/01/2022 1151   GFRNONAA 96 11/03/2017 0821   GFRAA >60 12/09/2018 0500   GFRAA 111 11/03/2017 0821    Lipid Panel     Component Value Date/Time   CHOL 274 (H) 01/07/2023 0849   CHOL 243 (H) 03/25/2015 0802   TRIG 239 (H) 01/07/2023 0849   HDL 45 01/07/2023 0849   HDL 38 (L) 03/25/2015 0802   CHOLHDL 6.1 (H) 01/07/2023 0849   VLDL 48 (H) 10/01/2016 1140   LDLCALC 185 (H) 01/07/2023 0849    CBC    Component Value Date/Time   WBC 7.0 01/07/2023 0849   RBC 5.73 01/07/2023 0849   HGB 17.7 (H) 01/07/2023 0849   HGB 16.0 03/25/2015 0802   HCT 52.6 (H) 01/07/2023 0849   HCT 45.1 03/25/2015 0802   PLT 209 01/07/2023 0849   PLT 190 03/25/2015 0802   MCV 91.8 01/07/2023 0849   MCV 90 03/25/2015 0802   MCV 89 05/19/2013 0254   MCH 30.9 01/07/2023 0849   MCHC 33.7 01/07/2023 0849   RDW 12.3 01/07/2023 0849   RDW 13.4 03/25/2015 0802   RDW 13.2 05/19/2013 0254  LYMPHSABS 2.2 12/09/2018 0500   LYMPHSABS 1.6 03/25/2015 0802   LYMPHSABS 1.4 05/19/2013 0254   MONOABS 0.8 12/09/2018 0500   MONOABS 1.2 (H) 05/19/2013 0254   EOSABS 0.1 12/09/2018 0500   EOSABS 0.2 03/25/2015 0802   EOSABS 0.1 05/19/2013 0254   BASOSABS 0.0 12/09/2018 0500   BASOSABS 0.0 03/25/2015 0802   BASOSABS 0.1 05/19/2013 0254    Hgb  A1C Lab Results  Component Value Date   HGBA1C 10.9 (A) 01/07/2023           Assessment & Plan:     RTC in 3 months, follow-up chronic conditions Nicki Reaper, NP

## 2023-04-11 NOTE — Assessment & Plan Note (Signed)
Encourage diet and exercise for weight loss 

## 2023-04-11 NOTE — Assessment & Plan Note (Signed)
Continue Flomax Will monitor symptoms

## 2023-04-11 NOTE — Assessment & Plan Note (Addendum)
Currently a not an issue per his report

## 2023-04-11 NOTE — Patient Instructions (Signed)

## 2023-04-12 ENCOUNTER — Other Ambulatory Visit: Payer: Self-pay

## 2023-04-12 ENCOUNTER — Telehealth: Payer: Self-pay

## 2023-04-12 LAB — CBC
HCT: 52.2 % — ABNORMAL HIGH (ref 38.5–50.0)
Hemoglobin: 17.4 g/dL — ABNORMAL HIGH (ref 13.2–17.1)
MCH: 30.7 pg (ref 27.0–33.0)
MCHC: 33.3 g/dL (ref 32.0–36.0)
MCV: 92.1 fL (ref 80.0–100.0)
MPV: 10.5 fL (ref 7.5–12.5)
Platelets: 199 10*3/uL (ref 140–400)
RBC: 5.67 10*6/uL (ref 4.20–5.80)
RDW: 12.1 % (ref 11.0–15.0)
WBC: 6.3 10*3/uL (ref 3.8–10.8)

## 2023-04-12 LAB — COMPLETE METABOLIC PANEL WITH GFR
AG Ratio: 1.4 (calc) (ref 1.0–2.5)
ALT: 20 U/L (ref 9–46)
AST: 16 U/L (ref 10–35)
Albumin: 3.9 g/dL (ref 3.6–5.1)
Alkaline phosphatase (APISO): 90 U/L (ref 35–144)
BUN: 10 mg/dL (ref 7–25)
CO2: 29 mmol/L (ref 20–32)
Calcium: 9.1 mg/dL (ref 8.6–10.3)
Chloride: 100 mmol/L (ref 98–110)
Creat: 0.86 mg/dL (ref 0.70–1.35)
Globulin: 2.8 g/dL (ref 1.9–3.7)
Glucose, Bld: 364 mg/dL — ABNORMAL HIGH (ref 65–99)
Potassium: 4.3 mmol/L (ref 3.5–5.3)
Sodium: 136 mmol/L (ref 135–146)
Total Bilirubin: 0.7 mg/dL (ref 0.2–1.2)
Total Protein: 6.7 g/dL (ref 6.1–8.1)
eGFR: 94 mL/min/{1.73_m2} (ref >=60)

## 2023-04-12 LAB — LIPID PANEL
Cholesterol: 229 mg/dL — ABNORMAL HIGH (ref <200)
HDL: 49 mg/dL (ref >=40)
LDL Cholesterol (Calc): 126 mg/dL — ABNORMAL HIGH
Non-HDL Cholesterol (Calc): 180 mg/dL — ABNORMAL HIGH (ref <130)
Total CHOL/HDL Ratio: 4.7 (calc) (ref <5.0)
Triglycerides: 376 mg/dL — ABNORMAL HIGH (ref <150)

## 2023-04-12 LAB — MICROALBUMIN / CREATININE URINE RATIO
Creatinine, Urine: 41 mg/dL (ref 20–320)
Microalb Creat Ratio: 15 mg/g{creat} (ref <30)
Microalb, Ur: 0.6 mg/dL

## 2023-04-12 LAB — HEMOGLOBIN A1C
Hgb A1c MFr Bld: 12 %{Hb} — ABNORMAL HIGH (ref <5.7)
Mean Plasma Glucose: 298 mg/dL
eAG (mmol/L): 16.5 mmol/L

## 2023-04-12 NOTE — Progress Notes (Signed)
Unable to leave message

## 2023-04-12 NOTE — Telephone Encounter (Signed)
Pt given lab results per notes of Rene Kocher, NP on 04/12/23. Pt verbalized understanding. Patient is agreeable to increase the Atorvastatin dose to 80 MG and is agreeable to start taking insulin in addition to the weekly shots. Patient [facility] [place], NP 04/12/2023  8:32 AM EST     Your hemoglobin and hematocrit remain elevated.  This is due to smoking and will increase your risk for heart attack and stroke.  I recommend smoking cessation.  If you are unable to do this, we recommend monthly blood donation.  Blood counts otherwise normal.  Your liver and kidney function is normal.  Your cholesterol remains significantly uncontrolled.  Would you be willing to increase your atorvastatin to 80 mg daily?  Your A1c is up to 12% which is not good.  At this point, we need to start you on daily insulin in addition to your weekly injection.  Is this something you are agreeable with?  Consume a low saturated fat, low carb diet and exercise for weight loss

## 2023-04-13 MED ORDER — LANTUS SOLOSTAR 100 UNIT/ML ~~LOC~~ SOPN: 10.0000 [IU] | PEN_INJECTOR | Freq: Every day | SUBCUTANEOUS | 0 refills | Status: DC

## 2023-04-13 MED ORDER — ROSUVASTATIN CALCIUM 40 MG PO TABS: 40.0000 mg | ORAL_TABLET | Freq: Every day | ORAL | 1 refills | Status: DC

## 2023-04-13 NOTE — Telephone Encounter (Signed)
He had 2 different statins on his medication list, rosuvastatin and atorvastatin.  We need to confirm which medication he is taking but I would like him to be on rosuvastatin 40 mg daily.  I have sent the Lantus into his pharmacy.  He needs to inject 10 units nightly, monitor his fasting sugars for 2 weeks and then update me at that time.  I also received a call from the referral coordinator that he canceled his CTA chest for follow-up on his aneurysm as well as a CT lung cancer screening.  These are important test that he needs to have done.  Can he give further insight as to why he did not want to schedule this?

## 2023-04-13 NOTE — Addendum Note (Signed)
Addended by: Lorre Munroe on: 04/13/2023 09:42 AM   Modules accepted: Orders

## 2023-04-14 ENCOUNTER — Ambulatory Visit: Payer: Medicare HMO

## 2023-04-15 NOTE — Telephone Encounter (Signed)
Pt returned missed call. Unable to hear pt well, pt having issues with his phone.   Pt stated he would call back on Monday.

## 2023-04-15 NOTE — Telephone Encounter (Signed)
Attempted to reach patient to clarify and advise per [facility]. No answer, VM not set up.

## 2023-04-18 ENCOUNTER — Ambulatory Visit: Payer: Self-pay

## 2023-04-18 NOTE — Telephone Encounter (Signed)
  Chief Complaint: patient calling with multiple questions regarding medications that are not on current medication list.  Symptoms: na Frequency: today  Pertinent Negatives: Patient denies na Disposition: [] ED /[] Urgent Care (no appt availability in office) / [] Appointment(In office/virtual)/ []  [place] [facility]/ [] Home Care/ [] Refused Recommended Disposition /[] [place] [facility]/ [x]  Follow-up with PCP Additional Notes:   Patient and wife calling to clarify multiple medication questions. Patient received medications from pharmacy today that are not on current med list. Last OV note 04/11/23 with discontinued meds and patient reports he received trazodone, "calcium" 20 mg which believed to be rosuvastatin. Current med list shows rosuvastatin should be 40 mg daily. Please advise if patient should continue flomax 0.4 mg . How often to take lantus insulin? Current list shows 10 units bedtime but patient reports he has been taking 2 times daily. Patient unaware he was to be taking ASA 81 mg EC daily. Please advise.  [facility] wrong dose of cholesterol medication rosuvastatin that was dispensed. Please advise regarding current medication list.   Summary: Question about medication for a nurse   Pt has medication questions, not sure of which dose to get from the pharmacy. Says he does not know the name of the medication.  [phone]                Reason for Disposition  [1] Caller has medicine question about med NOT prescribed by PCP AND [2] triager unable to answer question (e.g., compatibility with other med, storage)  Answer Assessment - Initial Assessment Questions 1. NAME of MEDICINE: "What medicine(s) are you calling about?"     Multiple medications. "Calcium" 20 mg  2. QUESTION: "What is your question?" (e.g., double dose of medicine, side effect)     What medications should be taken? 3. PRESCRIBER: "Who prescribed the  medicine?" Reason: if prescribed by specialist, call should be referred to that group.     PCP 4. SYMPTOMS: "Do you have any symptoms?" If Yes, ask: "What symptoms are you having?"  "How bad are the symptoms (e.g., mild, moderate, severe)     Na  5. PREGNANCY:  "Is there any chance that you are pregnant?" "When was your last menstrual period?"     na  Protocols used: Medication Question Call-A-AH

## 2023-04-18 NOTE — Telephone Encounter (Signed)
Summary: Question about medication for a nurse   Pt has medication questions, not sure of which dose to get from the pharmacy. Says he does not know the name of the medication.  [phone]    Call went to voice mail - Voice mail not set up - unable to Leave message.

## 2023-04-19 NOTE — Telephone Encounter (Signed)
Noted. Nothing further needed at this time.

## 2023-05-23 ENCOUNTER — Encounter: Payer: Self-pay | Admitting: *Deleted

## 2023-05-23 ENCOUNTER — Telehealth: Payer: Self-pay | Admitting: *Deleted

## 2023-05-23 NOTE — Telephone Encounter (Signed)
Attempted to contact pt. Voicemail not set up so unable to leave a message. Letter mailed to patient asking him to contact us regarding scheduling lung cancer screening CT.

## 2023-06-01 ENCOUNTER — Encounter: Payer: Self-pay | Admitting: Acute Care

## 2023-06-20 ENCOUNTER — Encounter: Payer: Self-pay | Admitting: Internal Medicine

## 2023-07-06 ENCOUNTER — Other Ambulatory Visit: Payer: Self-pay | Admitting: Internal Medicine

## 2023-07-07 NOTE — Telephone Encounter (Signed)
Request too soon for refill, last refill 04/13/23 for 90 and 1 refill.  E-Prescribing Status: Receipt confirmed by pharmacy (04/13/2023  9:41 AM EST)   Requested Prescriptions  Pending Prescriptions Disp Refills   rosuvastatin (CRESTOR) 20 MG tablet [Pharmacy Med Name: ROSUVASTATIN CALCIUM 20 MG TAB] 90 tablet     Sig: TAKE 1 TABLET BY MOUTH EVERY DAY     Cardiovascular:  Antilipid - Statins 2 Failed - 07/07/2023  8:27 AM      Failed - Lipid Panel in normal range within the last 12 months    Cholesterol, Total  Date Value Ref Range Status  03/25/2015 243 (H) 100 - 199 mg/dL Final   Cholesterol  Date Value Ref Range Status  04/11/2023 229 (H) <200 mg/dL Final   LDL Cholesterol (Calc)  Date Value Ref Range Status  04/11/2023 126 (H) mg/dL (calc) Final    Comment:    Reference range: <100 . Desirable range <100 mg/dL for primary prevention;   <70 mg/dL for patients with CHD or diabetic patients  with > or = 2 CHD risk factors. Marland Kitchen LDL-C is now calculated using the Martin-Hopkins  calculation, which is a validated novel method providing  better accuracy than the Friedewald equation in the  estimation of LDL-C.  Horald Pollen et al. Lenox Ahr. 1610;960(45): 2061-2068  (http://education.[facility].com/faq/FAQ164)    HDL  Date Value Ref Range Status  04/11/2023 49 > OR = 40 mg/dL Final  40/98/1191 38 (L) >39 mg/dL Final    Comment:    According to ATP-III Guidelines, HDL-C >59 mg/dL is considered a negative risk factor for CHD.    Triglycerides  Date Value Ref Range Status  04/11/2023 376 (H) <150 mg/dL Final    Comment:    . If a non-fasting specimen was collected, consider repeat triglyceride testing on a fasting specimen if clinically indicated.  Perry Mount et al. J. of Clin. Lipidol. 2015;9:129-169. Marland Kitchen          Passed - Cr in normal range and within 360 days    Creat  Date Value Ref Range Status  04/11/2023 0.86 0.70 - 1.35 mg/dL Final   Creatinine, Urine  Date  Value Ref Range Status  04/11/2023 41 20 - 320 mg/dL Final         Passed - Patient is not pregnant      Passed - Valid encounter within last 12 months    Recent Outpatient Visits           2 months ago Type 2 diabetes mellitus with hyperglycemia, without long-term current use of insulin [facility])   [place] [facility] [place], [place] W, NP   6 months ago Type 2 diabetes mellitus with hyperglycemia, without long-term current use of insulin [facility])   [place] [facility] [place], Salvadore Oxford, NP   9 months ago Encounter for general adult medical examination with abnormal findings   [place] [facility] [place], [place] W, NP   1 year ago Type 2 diabetes mellitus with hyperglycemia, without long-term current use of insulin [facility])   [place] [facility] [place], Salvadore Oxford, NP   2 years ago Encounter for general adult medical examination with abnormal findings   [place] [facility] [place], Salvadore Oxford, NP       Future Appointments             In 6 days Ba[place], Salvadore Oxford, NP [facility],  [facility]

## 2023-07-13 ENCOUNTER — Ambulatory Visit: Payer: Self-pay | Admitting: Internal Medicine

## 2023-07-13 NOTE — Progress Notes (Deleted)
 Subjective:    Patient ID: Craig Bruce, male    DOB: 1955/08/26, 68 y.o.   MRN: [id number]  HPI  Patient presents to clinic today for 8-month follow-up of chronic conditions.  DM2: His last A1c was 12%, 03/2023.  He is taking Lantus and ozempic as prescribed.  He is on lisinopril for renal protection.  He does not check his sugars.  He checks his feet routinely.  His last eye exam was 04/2022.  Flu 12/2022.  Pneumovax unsure.  Prevnar unsure.  COVID Pfizer x 2.  OA: He denies any joint pain at this time. He is not taking any medications for this. He does not follow with orthopedics.  Insomnia: He has difficulty staying asleep.  He is not currently taking medications for this but has been on trazodone in the past.  There is no sleep study on file.  HLD with aortic atherosclerosis: His last LDL was 126, triglycerides 376, 12/2022.  He is taking rosuvastatin and ezetimibe as prescribed.  He is taking aspirin as well.  He tries to consume low-fat diet.  BPH: He reports urinary frequency.  He is taking flomax as prescribed.  He does not follow with urology.  Thoracic aortic aneurysm: CT from 07/2021 reviewed.  He does not follow with vascular.  HTN: His BP today is 120/78.  He is taking lisinopril as prescribed.  ECG from 10/2022 reviewed.  Emphysema: He denies chronic cough or shortness of breath.  He does continue to smoke.  He is not using any inhalers at this time.  CT lung cancer screening from 07/2021 reviewed.  Review of Systems     Past Medical History:  Diagnosis Date   Annual physical exam 03/24/2015   Arthritis    BOTH HANDS   Chronic kidney disease    H/O KIDNEY STONES   DVT (deep venous thrombosis) (HCC) 1996   Gastritis    Immunization due 03/24/2015   Need for influenza vaccination 03/24/2015   Obesity (BMI 30.0-34.9) 02/17/2015   Smoker 02/17/2015    Current Outpatient Medications  Medication Sig Dispense Refill   aspirin 81 MG EC tablet Take 1 tablet (81 mg  total) by mouth daily. Swallow whole. 30 tablet 12   ezetimibe (ZETIA) 10 MG tablet Take 1 tablet (10 mg total) by mouth daily. 90 tablet 0   insulin glargine (LANTUS SOLOSTAR) 100 UNIT/ML Solostar Pen Inject 10 Units into the skin at bedtime. 15 mL 0   lisinopril (ZESTRIL) 5 MG tablet Take 1 tablet (5 mg total) by mouth daily. 90 tablet 1   OZEMPIC, 0.25 OR 0.5 MG/DOSE, 2 MG/3ML SOPN Inject 0.5 mg into the skin once a week. Start with 0.25mg  weekly x 4 weeks then increase to 0.5mg  weekly injection. 9 mL 0   rosuvastatin (CRESTOR) 40 MG tablet Take 1 tablet (40 mg total) by mouth daily. 90 tablet 1   tamsulosin (FLOMAX) 0.4 MG CAPS capsule Take 1 capsule (0.4 mg total) by mouth daily after breakfast. 90 capsule 0   No current facility-administered medications for this visit.    Allergies  Allergen Reactions   Other     BLOOD THINNER USED 20 YEARS AGO-PT UNSURE EXACTLY OF NAME-CAUSED FEVERS OF 104 PER PT    Family History  Problem Relation Age of Onset   Alcohol abuse Mother    Alcohol abuse Father    Prostate cancer Neg Hx    Kidney cancer Neg Hx    Kidney failure Neg Hx  Colon cancer Neg Hx     Social History   Socioeconomic History   Marital status: Significant Other    Spouse name: Not on file   Number of children: 1   Years of education: Not on file   Highest education level: Not on file  Occupational History   Occupation: retired    Comment: Retired 2022  Tobacco Use   Smoking status: Every Day    Current packs/day: 1.00    Average packs/day: 1 pack/day for 24.0 years (24.0 ttl pk-yrs)    Types: Cigarettes   Smokeless tobacco: Never   Tobacco comments:    Previously smoked 1ppd.  Cutting back and down from 5 cigs / day to 4 cigs per day in 4 weeks.  Vaping Use   Vaping status: Never Used  Substance and Sexual Activity   Alcohol use: Yes    Alcohol/week: 2.0 standard drinks of alcohol    Types: 2 Cans of beer per week    Comment: BEER OCC   Drug use: No    Sexual activity: Not Currently  Other Topics Concern   Not on file  Social History Narrative   Not on file   Social Drivers of Health   Financial Resource Strain: Low Risk  (03/17/2023)   Overall Financial Resource Strain (CARDIA)    Difficulty of Paying Living Expenses: Not hard at all  Food Insecurity: No Food Insecurity (03/17/2023)   Hunger Vital Sign    Worried About Running Out of Food in the Last Year: Never true    Ran Out of Food in the Last Year: Never true  Transportation Needs: No Transportation Needs (03/17/2023)   PRAPARE - Administrator, Civil Service (Medical): No    Lack of Transportation (Non-Medical): No  Physical Activity: Sufficiently Active (03/17/2023)   Exercise Vital Sign    Days of Exercise per Week: 7 days    Minutes of Exercise per Session: 40 min  Stress: No Stress Concern Present (03/17/2023)   [employer] of Occupational Health - Occupational Stress Questionnaire    Feeling of Stress : Not at all  Social Connections: Moderately Isolated (03/17/2023)   Social Connection and Isolation Panel [NHANES]    Frequency of Communication with Friends and Family: Twice a week    Frequency of Social Gatherings with Friends and Family: Never    Attends Religious Services: More than 4 times per year    Active Member of [employer] or Organizations: No    Attends Banker Meetings: Never    Marital Status: Living with partner  Intimate Partner Violence: Not At Risk (03/17/2023)   Humiliation, Afraid, Rape, and Kick questionnaire    Fear of Current or Ex-Partner: No    Emotionally Abused: No    Physically Abused: No    Sexually Abused: No     Constitutional: Denies fever, malaise, fatigue, headache or abrupt weight changes.  HEENT: Denies eye pain, eye redness, ear pain, ringing in the ears, wax buildup, runny nose, nasal congestion, bloody nose, or sore throat. Respiratory: Denies difficulty breathing, shortness of breath, cough or  sputum production.   Cardiovascular: Denies chest pain, chest tightness, palpitations or swelling in the hands or feet.  Gastrointestinal: Denies abdominal pain, bloating, constipation, diarrhea or blood in the stool.  GU: Patient reports urinary frequency.  Denies urgency, frequency, pain with urination, burning sensation, blood in urine, odor or discharge. Musculoskeletal: Denies decrease in range of motion, difficulty with gait, muscle pain or joint pain  orswelling.  Skin: Denies redness, rashes, lesions or ulcercations.  Neurological: Patient reports insomnia.  Denies dizziness, difficulty with memory, difficulty with speech or problems with balance and coordination.  Psych: Denies anxiety, depression, SI/HI.  No other specific complaints in a complete review of systems (except as listed in HPI above).  Objective:   Physical Exam  There were no vitals taken for this visit.   Wt Readings from Last 3 Encounters:  04/11/23 202 lb 3.2 oz (91.7 kg)  01/07/23 204 lb (92.5 kg)  11/01/22 210 lb (95.3 kg)    General: Appears his stated age, obese, in NAD. Skin: Warm, dry and intact. No  ulcerations noted. HEENT: Head: normal shape and size; Eyes: sclera white, no icterus, conjunctiva pink, PERRLA and EOMs intact;  Cardiovascular: Normal rate and rhythm. S1,S2 noted.  No murmur, rubs or gallops noted. No JVD or BLE edema. No carotid bruits noted. Pulmonary/Chest: Normal effort and diminished breath sounds. No respiratory distress. No wheezes, rales or ronchi noted.  Abdomen: Normal bowel sounds.  Musculoskeletal: No difficulty with gait.  Neurological: Alert and oriented. Coordination normal.  Psychiatric: Mood and affect normal. Behavior is normal. Judgment and thought content normal.    BMET    Component Value Date/Time   NA 136 04/11/2023 0843   NA 140 03/25/2015 0802   NA 137 05/19/2013 0254   K 4.3 04/11/2023 0843   K 3.9 05/19/2013 0254   CL 100 04/11/2023 0843   CL 107  05/19/2013 0254   CO2 29 04/11/2023 0843   CO2 25 05/19/2013 0254   GLUCOSE 364 (H) 04/11/2023 0843   GLUCOSE 124 (H) 05/19/2013 0254   BUN 10 04/11/2023 0843   BUN 15 03/25/2015 0802   BUN 14 05/19/2013 0254   CREATININE 0.86 04/11/2023 0843   CALCIUM 9.1 04/11/2023 0843   CALCIUM 8.5 05/19/2013 0254   GFRNONAA >60 11/01/2022 1151   GFRNONAA 96 11/03/2017 0821   GFRAA >60 12/09/2018 0500   GFRAA 111 11/03/2017 0821    Lipid Panel     Component Value Date/Time   CHOL 229 (H) 04/11/2023 0843   CHOL 243 (H) 03/25/2015 0802   TRIG 376 (H) 04/11/2023 0843   HDL 49 04/11/2023 0843   HDL 38 (L) 03/25/2015 0802   CHOLHDL 4.7 04/11/2023 0843   VLDL 48 (H) 10/01/2016 1140   LDLCALC 126 (H) 04/11/2023 0843    CBC    Component Value Date/Time   WBC 6.3 04/11/2023 0843   RBC 5.67 04/11/2023 0843   HGB 17.4 (H) 04/11/2023 0843   HGB 16.0 03/25/2015 0802   HCT 52.2 (H) 04/11/2023 0843   HCT 45.1 03/25/2015 0802   PLT 199 04/11/2023 0843   PLT 190 03/25/2015 0802   MCV 92.1 04/11/2023 0843   MCV 90 03/25/2015 0802   MCV 89 05/19/2013 0254   MCH 30.7 04/11/2023 0843   MCHC 33.3 04/11/2023 0843   RDW 12.1 04/11/2023 0843   RDW 13.4 03/25/2015 0802   RDW 13.2 05/19/2013 0254   LYMPHSABS 2.2 12/09/2018 0500   LYMPHSABS 1.6 03/25/2015 0802   LYMPHSABS 1.4 05/19/2013 0254   MONOABS 0.8 12/09/2018 0500   MONOABS 1.2 (H) 05/19/2013 0254   EOSABS 0.1 12/09/2018 0500   EOSABS 0.2 03/25/2015 0802   EOSABS 0.1 05/19/2013 0254   BASOSABS 0.0 12/09/2018 0500   BASOSABS 0.0 03/25/2015 0802   BASOSABS 0.1 05/19/2013 0254    Hgb A1C Lab Results  Component Value Date   HGBA1C 12.0 (H)  04/11/2023           Assessment & Plan:     RTC in 3 months for your annual exam Nicki Reaper, NP

## 2023-08-25 ENCOUNTER — Other Ambulatory Visit: Payer: Self-pay | Admitting: Internal Medicine

## 2023-08-26 ENCOUNTER — Other Ambulatory Visit: Payer: Self-pay | Admitting: Internal Medicine

## 2023-08-26 NOTE — Telephone Encounter (Signed)
 Patient will need an office visit for additional refills. Requested Prescriptions  Pending Prescriptions Disp Refills   insulin glargine (LANTUS SOLOSTAR) 100 UNIT/ML Solostar Pen [Pharmacy Med Name: LANTUS SOLOSTAR 100 UNIT/ML] 15 mL 0    Sig: INJECT 10 UNITS INTO THE SKIN AT BEDTIME.     Endocrinology:  Diabetes - Insulins Failed - 08/26/2023 10:56 AM      Failed - HBA1C is between 0 and 7.9 and within 180 days    Hgb A1c MFr Bld  Date Value Ref Range Status  04/11/2023 12.0 (H) <5.7 % of total Hgb Final    Comment:    For someone without known diabetes, a hemoglobin A1c value of 6.5% or greater indicates that they may have  diabetes and this should be confirmed with a follow-up  test. . For someone with known diabetes, a value <7% indicates  that their diabetes is well controlled and a value  greater than or equal to 7% indicates suboptimal  control. A1c targets should be individualized based on  duration of diabetes, age, comorbid conditions, and  other considerations. . Currently, no consensus exists regarding use of hemoglobin A1c for diagnosis of diabetes for children. .          Failed - Valid encounter within last 6 months    Recent Outpatient Visits   None

## 2023-08-29 ENCOUNTER — Other Ambulatory Visit: Payer: Self-pay | Admitting: Internal Medicine

## 2023-08-29 NOTE — Telephone Encounter (Signed)
 Requested medication (s) are due for refill today: alternative requested  Requested medication (s) are on the active medication list: yes  Last refill:  08/26/23  Future visit scheduled: yes  Notes to clinic:  Pharmacy comment: Alternative Requested:THE PRESCRIBED MEDICATION IS NOT COVERED BY INSURANCE. PLEASE CONSIDER CHANGING TO ONE OF THE SUGGESTED COVERED ALTERNATIVES.      Requested Prescriptions  Pending Prescriptions Disp Refills   LANTUS SOLOSTAR 100 UNIT/ML Solostar Pen [Pharmacy Med Name: LANTUS SOLOSTAR 100 UNIT/ML]  0     Endocrinology:  Diabetes - Insulins Failed - 08/29/2023  8:32 AM      Failed - HBA1C is between 0 and 7.9 and within 180 days    Hgb A1c MFr Bld  Date Value Ref Range Status  04/11/2023 12.0 (H) <5.7 % of total Hgb Final    Comment:    For someone without known diabetes, a hemoglobin A1c value of 6.5% or greater indicates that they may have  diabetes and this should be confirmed with a follow-up  test. . For someone with known diabetes, a value <7% indicates  that their diabetes is well controlled and a value  greater than or equal to 7% indicates suboptimal  control. A1c targets should be individualized based on  duration of diabetes, age, comorbid conditions, and  other considerations. . Currently, no consensus exists regarding use of hemoglobin A1c for diagnosis of diabetes for children. .          Failed - Valid encounter within last 6 months    Recent Outpatient Visits   None

## 2023-08-30 NOTE — Telephone Encounter (Signed)
 Requested medication (s) are due for refill today: No  Requested medication (s) are on the active medication list: Yes  Last refill:  08/26/23  Future visit scheduled:   Notes to clinic:  See pharmacy request.    Requested Prescriptions  Pending Prescriptions Disp Refills   LANTUS SOLOSTAR 100 UNIT/ML Solostar Pen [Pharmacy Med Name: LANTUS SOLOSTAR 100 UNIT/ML]  0     Endocrinology:  Diabetes - Insulins Failed - 08/30/2023 11:25 AM      Failed - HBA1C is between 0 and 7.9 and within 180 days    Hgb A1c MFr Bld  Date Value Ref Range Status  04/11/2023 12.0 (H) <5.7 % of total Hgb Final    Comment:    For someone without known diabetes, a hemoglobin A1c value of 6.5% or greater indicates that they may have  diabetes and this should be confirmed with a follow-up  test. . For someone with known diabetes, a value <7% indicates  that their diabetes is well controlled and a value  greater than or equal to 7% indicates suboptimal  control. A1c targets should be individualized based on  duration of diabetes, age, comorbid conditions, and  other considerations. . Currently, no consensus exists regarding use of hemoglobin A1c for diagnosis of diabetes for children. .          Failed - Valid encounter within last 6 months    Recent Outpatient Visits   None

## 2023-10-02 ENCOUNTER — Other Ambulatory Visit: Payer: Self-pay | Admitting: Internal Medicine

## 2023-10-19 ENCOUNTER — Ambulatory Visit (INDEPENDENT_AMBULATORY_CARE_PROVIDER_SITE_OTHER): Admitting: Internal Medicine

## 2023-10-19 ENCOUNTER — Encounter: Payer: Self-pay | Admitting: Internal Medicine

## 2023-10-19 VITALS — BP 118/76 | HR 76 | Ht 67.0 in | Wt 203.1 lb

## 2023-10-19 DIAGNOSIS — E66811 Obesity, class 1: Secondary | ICD-10-CM

## 2023-10-19 DIAGNOSIS — E1165 Type 2 diabetes mellitus with hyperglycemia: Secondary | ICD-10-CM | POA: Diagnosis not present

## 2023-10-19 DIAGNOSIS — B9789 Other viral agents as the cause of diseases classified elsewhere: Secondary | ICD-10-CM

## 2023-10-19 DIAGNOSIS — J301 Allergic rhinitis due to pollen: Secondary | ICD-10-CM

## 2023-10-19 DIAGNOSIS — J329 Chronic sinusitis, unspecified: Secondary | ICD-10-CM

## 2023-10-19 DIAGNOSIS — Z794 Long term (current) use of insulin: Secondary | ICD-10-CM | POA: Diagnosis not present

## 2023-10-19 DIAGNOSIS — Z6831 Body mass index (BMI) 31.0-31.9, adult: Secondary | ICD-10-CM

## 2023-10-19 DIAGNOSIS — E6609 Other obesity due to excess calories: Secondary | ICD-10-CM

## 2023-10-19 DIAGNOSIS — Z7985 Long-term (current) use of injectable non-insulin antidiabetic drugs: Secondary | ICD-10-CM

## 2023-10-19 MED ORDER — INSULIN PEN NEEDLE 31G X 5 MM MISC: 1 refills | Status: DC

## 2023-10-19 MED ORDER — FLUTICASONE PROPIONATE 50 MCG/ACT NA SUSP: 2.0000 | Freq: Every day | NASAL | 3 refills | Status: AC

## 2023-10-19 MED ORDER — METHYLPREDNISOLONE ACETATE 80 MG/ML IJ SUSP
80.0000 mg | Freq: Once | INTRAMUSCULAR | Status: AC
Start: 2023-10-19 — End: 2023-10-19
  Administered 2023-10-19: 80 mg via INTRAMUSCULAR

## 2023-10-19 MED ORDER — CETIRIZINE HCL 10 MG PO TABS: 10.0000 mg | ORAL_TABLET | Freq: Every day | ORAL | 0 refills | Status: DC

## 2023-10-19 NOTE — Progress Notes (Signed)
 Subjective:    Patient ID: Craig Bruce, male    DOB: [birth year], 68 y.o.   MRN: [id number]  HPI  Discussed the use of AI scribe software for clinical note transcription with the patient, who gave verbal consent to proceed.   Craig Bruce is a 68 year old male with diabetes who presents with sinus pressure and swelling.  He has been experiencing sinus pressure and swelling for the past three weeks. No headaches, runny nose, nasal congestion, ear pain, sore throat, cough, shortness of breath, fever, or chills. He has been taking benadryl, which provides some relief, but has not used nasal sprays or other antihistamines.  He has a history of allergies that tend to aggravate him around this time of year. He is currently trying to cut back on smoking and smokes about four cigarettes a day.  He has diabetes and takes daily insulin injections. He reports difficulty obtaining needles for his lantus  syringes and is running low, with only five left. His last A1c was noted to be high at 12, 03/2023.       Review of Systems   Past Medical History:  Diagnosis Date   Annual physical exam 03/24/2015   Arthritis    BOTH HANDS   Chronic kidney disease    H/O KIDNEY STONES   DVT (deep venous thrombosis) (HCC) 1996   Gastritis    Immunization due 03/24/2015   Need for influenza vaccination 03/24/2015   Obesity (BMI 30.0-34.9) 02/17/2015   Smoker 02/17/2015    Current Outpatient Medications  Medication Sig Dispense Refill   aspirin  81 MG EC tablet Take 1 tablet (81 mg total) by mouth daily. Swallow whole. 30 tablet 12   ezetimibe  (ZETIA ) 10 MG tablet Take 1 tablet (10 mg total) by mouth daily. 90 tablet 0   insulin glargine  (LANTUS  SOLOSTAR) 100 UNIT/ML Solostar Pen INJECT 10 UNITS INTO THE SKIN AT BEDTIME. 15 mL 0   lisinopril  (ZESTRIL ) 5 MG tablet Take 1 tablet (5 mg total) by mouth daily. 90 tablet 1   OZEMPIC , 0.25 OR 0.5 MG/DOSE, 2 MG/3ML SOPN Inject 0.5 mg into the skin once a  week. Start with 0.25mg  weekly x 4 weeks then increase to 0.5mg  weekly injection. 9 mL 0   rosuvastatin  (CRESTOR ) 40 MG tablet Take 1 tablet (40 mg total) by mouth daily. 90 tablet 1   tamsulosin  (FLOMAX ) 0.4 MG CAPS capsule Take 1 capsule (0.4 mg total) by mouth daily after breakfast. 90 capsule 0   No current facility-administered medications for this visit.    Allergies  Allergen Reactions   Other     BLOOD THINNER USED 20 YEARS AGO-PT UNSURE EXACTLY OF NAME-CAUSED FEVERS OF 104 PER PT    Family History  Problem Relation Age of Onset   Alcohol abuse Mother    Alcohol abuse Father    Prostate cancer Neg Hx    Kidney cancer Neg Hx    Kidney failure Neg Hx    Colon cancer Neg Hx     Social History   Socioeconomic History   Marital status: Significant Other    Spouse name: Not on file   Number of children: 1   Years of education: Not on file   Highest education level: Not on file  Occupational History   Occupation: retired    Comment: Retired 2022  Tobacco Use   Smoking status: Every Day    Current packs/day: 1.00    Average packs/day: 1 pack/day for 24.0 years (  24.0 ttl pk-yrs)    Types: Cigarettes   Smokeless tobacco: Never   Tobacco comments:    Previously smoked 1ppd.  Cutting back and down from 5 cigs / day to 4 cigs per day in 4 weeks.  Vaping Use   Vaping status: Never Used  Substance and Sexual Activity   Alcohol use: Yes    Alcohol/week: 2.0 standard drinks of alcohol    Types: 2 Cans of beer per week    Comment: BEER OCC   Drug use: No   Sexual activity: Not Currently  Other Topics Concern   Not on file  Social History Narrative   Not on file   Social Drivers of Health   Financial Resource Strain: Low Risk  (03/17/2023)   Overall Financial Resource Strain (CARDIA)    Difficulty of Paying Living Expenses: Not hard at all  Food Insecurity: No Food Insecurity (03/17/2023)   Hunger Vital Sign    Worried About Running Out of Food in the Last Year:  Never true    Ran Out of Food in the Last Year: Never true  Transportation Needs: No Transportation Needs (03/17/2023)   PRAPARE - Administrator, Civil Service (Medical): No    Lack of Transportation (Non-Medical): No  Physical Activity: Sufficiently Active (03/17/2023)   Exercise Vital Sign    Days of Exercise per Week: 7 days    Minutes of Exercise per Session: 40 min  Stress: No Stress Concern Present (03/17/2023)   [employer] of Occupational Health - Occupational Stress Questionnaire    Feeling of Stress : Not at all  Social Connections: Moderately Isolated (03/17/2023)   Social Connection and Isolation Panel [NHANES]    Frequency of Communication with Friends and Family: Twice a week    Frequency of Social Gatherings with Friends and Family: Never    Attends Religious Services: More than 4 times per year    Active Member of [employer] or Organizations: No    Attends Banker Meetings: Never    Marital Status: Living with partner  Intimate Partner Violence: Not At Risk (03/17/2023)   Humiliation, Afraid, Rape, and Kick questionnaire    Fear of Current or Ex-Partner: No    Emotionally Abused: No    Physically Abused: No    Sexually Abused: No     Constitutional: Denies fever, malaise, fatigue, headache or abrupt weight changes.  HEENT: Pt reports sinus pressure. Denies eye pain, eye redness, ear pain, ringing in the ears, wax buildup, runny nose, nasal congestion, bloody nose, or sore throat. Respiratory: Denies difficulty breathing, shortness of breath, cough or sputum production.   Cardiovascular: Denies chest pain, chest tightness, palpitations or swelling in the hands or feet.  Musculoskeletal: Denies decrease in range of motion, difficulty with gait, muscle pain or joint pain and swelling.  Skin: Denies redness, rashes, lesions or ulcercations.  Neurological: Denies dizziness, difficulty with memory, difficulty with speech or problems with balance  and coordination.   No other specific complaints in a complete review of systems (except as listed in HPI above).      Objective:   Physical Exam  BP 118/76 (BP Location: Right Arm, Patient Position: Sitting, Cuff Size: Normal)   Pulse 76   Ht 5\' 7"  (1.702 m)   Wt 203 lb 2 oz (92.1 kg)   SpO2 99%   BMI 31.81 kg/m   Wt Readings from Last 3 Encounters:  04/11/23 202 lb 3.2 oz (91.7 kg)  01/07/23 204 lb (92.5  kg)  11/01/22 210 lb (95.3 kg)    General: Appears his stated age, obese, in NAD. Skin: Warm, dry and intact. No rashes noted. HEENT: Head: normal shape and size, frontal and maxillary sinus tenderness noted; Eyes: sclera white, no icterus, conjunctiva pink, PERRLA and EOMs intact; Nose: mucosa pink and moist, septum midline; Throat/Mouth: Teeth present, mucosa pink and moist, no exudate, lesions or ulcerations noted.  Neck: No adenopathy noted. Cardiovascular: Normal rate and rhythm.  Pulmonary/Chest: Normal effort and positive vesicular breath sounds. No respiratory distress. No wheezes, rales or ronchi noted.  Musculoskeletal:  No difficulty with gait.  Neurological: Alert and oriented.Coordination normal.    BMET    Component Value Date/Time   NA 136 04/11/2023 0843   NA 140 03/25/2015 0802   NA 137 05/19/2013 0254   K 4.3 04/11/2023 0843   K 3.9 05/19/2013 0254   CL 100 04/11/2023 0843   CL 107 05/19/2013 0254   CO2 29 04/11/2023 0843   CO2 25 05/19/2013 0254   GLUCOSE 364 (H) 04/11/2023 0843   GLUCOSE 124 (H) 05/19/2013 0254   BUN 10 04/11/2023 0843   BUN 15 03/25/2015 0802   BUN 14 05/19/2013 0254   CREATININE 0.86 04/11/2023 0843   CALCIUM  9.1 04/11/2023 0843   CALCIUM  8.5 05/19/2013 0254   GFRNONAA >60 11/01/2022 1151   GFRNONAA 96 11/03/2017 0821   GFRAA >60 12/09/2018 0500   GFRAA 111 11/03/2017 0821    Lipid Panel     Component Value Date/Time   CHOL 229 (H) 04/11/2023 0843   CHOL 243 (H) 03/25/2015 0802   TRIG 376 (H) 04/11/2023 0843    HDL 49 04/11/2023 0843   HDL 38 (L) 03/25/2015 0802   CHOLHDL 4.7 04/11/2023 0843   VLDL 48 (H) 10/01/2016 1140   LDLCALC 126 (H) 04/11/2023 0843    CBC    Component Value Date/Time   WBC 6.3 04/11/2023 0843   RBC 5.67 04/11/2023 0843   HGB 17.4 (H) 04/11/2023 0843   HGB 16.0 03/25/2015 0802   HCT 52.2 (H) 04/11/2023 0843   HCT 45.1 03/25/2015 0802   PLT 199 04/11/2023 0843   PLT 190 03/25/2015 0802   MCV 92.1 04/11/2023 0843   MCV 90 03/25/2015 0802   MCV 89 05/19/2013 0254   MCH 30.7 04/11/2023 0843   MCHC 33.3 04/11/2023 0843   RDW 12.1 04/11/2023 0843   RDW 13.4 03/25/2015 0802   RDW 13.2 05/19/2013 0254   LYMPHSABS 2.2 12/09/2018 0500   LYMPHSABS 1.6 03/25/2015 0802   LYMPHSABS 1.4 05/19/2013 0254   MONOABS 0.8 12/09/2018 0500   MONOABS 1.2 (H) 05/19/2013 0254   EOSABS 0.1 12/09/2018 0500   EOSABS 0.2 03/25/2015 0802   EOSABS 0.1 05/19/2013 0254   BASOSABS 0.0 12/09/2018 0500   BASOSABS 0.0 03/25/2015 0802   BASOSABS 0.1 05/19/2013 0254    Hgb A1C Lab Results  Component Value Date   HGBA1C 12.0 (H) 04/11/2023            Assessment & Plan:  Assessment and Plan    Allergic rhinitis Viral sinusitis with seasonal pattern and history of diphenhydramine use. Avoided oral steroids due to potential hyperglycemia exacerbation. Considered antibiotics if no improvement in one week. Discussed steroid injection, including benefits for swelling relief and risks of hyperglycemia exacerbation. - Administered Depo Medrol  80 mg IM injection. - Prescribed Flonase nasal spray, 2 sprays in each nostril in the morning. - Prescribed Zyrtec 10 mg at bedtime. - Scheduled follow-up  in one week to reassess symptoms and consider antibiotics if no improvement.  Type 2 diabetes mellitus with hyperglycemia Type 2 diabetes mellitus with A1c of 12. Current management includes daily insulin glargine . Reports difficulty obtaining pen needles for insulin administration. - Sent  prescription for pen needles to [facility].        Schedule an appointment for your annual exam Helayne Lo, NP

## 2023-10-19 NOTE — Assessment & Plan Note (Signed)
 Encourage diet and exercise for weight loss

## 2023-10-19 NOTE — Patient Instructions (Signed)
 Allergic Rhinitis, Adult  Allergic rhinitis is a reaction to allergens. Allergens are things that can cause an allergic reaction. This condition affects the lining inside the nose (mucous membrane). There are two types of allergic rhinitis: Seasonal. This type is also called hay fever. It happens only during some times of the year. Perennial. This type can happen at any time of the year. This condition cannot be spread from person to person (is not contagious). It can be mild, bad, or very bad. It can develop at any age and may be outgrown. What are the causes? Pollen from grasses, trees, and weeds. Other causes can be: Dust mites. Smoke. Mold. Car fumes. The pee (urine), spit, or dander of pets. Dander is dead skin cells from a pet. What increases the risk? You are more likely to develop this condition if: You have allergies in your family. You have problems like allergies in your family. You may have: Swelling of parts of your eyes and eyelids. Asthma. This affects how you breathe. Long-term redness and swelling on your skin. Food allergies. What are the signs or symptoms? The main symptom of this condition is a runny or stuffy nose (nasal congestion). Other symptoms may include: Sneezing or coughing. Itching and tearing of your eyes. Mucus that drips down the back of your throat (postnasal drip). This may cause a sore throat. Trouble sleeping. Feeling tired. Headache. How is this treated? There is no cure for this condition. You should avoid things that you are allergic to. Treatment can help to relieve symptoms. This may include: Medicines that block allergy symptoms, such as anti-inflammatories or antihistamines. These may be given as a shot, nasal spray, or pill. Avoiding things you are allergic to. Medicines that give you some of what you are allergic to over time. This is called immunotherapy. It is done if other treatments do not help. You may get: Shots. Medicine under  your tongue. Stronger medicines, if other treatments do not help. Follow these instructions at home: Avoiding allergens Find out what things you are allergic to and avoid them. To do this, try these things: If you get allergies any time of year: Replace carpet with wood, tile, or vinyl flooring. Carpet can trap pet dander and dust. Do not smoke. Do not allow smoking in your home. Change your heating and air conditioning filters at least once a month. If you get allergies only some times of the year: Keep windows closed when you can. Plan things to do outside when pollen counts are lowest. Check pollen counts before you plan things to do outside. When you come indoors, change your clothes and shower before you sit on furniture or bedding. If you are allergic to a pet: Keep the pet out of your bedroom. Vacuum, sweep, and dust often. General instructions Take over-the-counter and prescription medicines only as told by your doctor. Drink enough fluid to keep your pee pale yellow. Where to find more information American Academy of Allergy, Asthma & Immunology: aaaai.org Contact a doctor if: You have a fever. You get a cough that does not go away. You make high-pitched whistling sounds when you breathe, most often when you breathe out (wheeze). Your symptoms slow you down. Your symptoms stop you from doing your normal things each day. Get help right away if: You are short of breath. This symptom may be an emergency. Get help right away. Call 911. Do not wait to see if the symptom will go away. Do not drive yourself to the  hospital. This information is not intended to replace advice given to you by your health care provider. Make sure you discuss any questions you have with your health care provider. Document Revised: 01/18/2022 Document Reviewed: 01/18/2022 Elsevier Patient Education  2024 [employer].

## 2023-10-26 ENCOUNTER — Encounter: Payer: Self-pay | Admitting: Internal Medicine

## 2023-10-26 ENCOUNTER — Ambulatory Visit (INDEPENDENT_AMBULATORY_CARE_PROVIDER_SITE_OTHER): Admitting: Internal Medicine

## 2023-10-26 VITALS — BP 118/72 | Ht 67.0 in | Wt 197.0 lb

## 2023-10-26 DIAGNOSIS — Z0001 Encounter for general adult medical examination with abnormal findings: Secondary | ICD-10-CM | POA: Diagnosis not present

## 2023-10-26 DIAGNOSIS — Z23 Encounter for immunization: Secondary | ICD-10-CM | POA: Diagnosis not present

## 2023-10-26 DIAGNOSIS — E66811 Obesity, class 1: Secondary | ICD-10-CM | POA: Diagnosis not present

## 2023-10-26 DIAGNOSIS — Z7985 Long-term (current) use of injectable non-insulin antidiabetic drugs: Secondary | ICD-10-CM

## 2023-10-26 DIAGNOSIS — Z794 Long term (current) use of insulin: Secondary | ICD-10-CM

## 2023-10-26 DIAGNOSIS — Z6830 Body mass index (BMI) 30.0-30.9, adult: Secondary | ICD-10-CM

## 2023-10-26 DIAGNOSIS — E6609 Other obesity due to excess calories: Secondary | ICD-10-CM

## 2023-10-26 DIAGNOSIS — E1165 Type 2 diabetes mellitus with hyperglycemia: Secondary | ICD-10-CM | POA: Diagnosis not present

## 2023-10-26 DIAGNOSIS — Z125 Encounter for screening for malignant neoplasm of prostate: Secondary | ICD-10-CM

## 2023-10-26 DIAGNOSIS — Z1211 Encounter for screening for malignant neoplasm of colon: Secondary | ICD-10-CM

## 2023-10-26 NOTE — Progress Notes (Signed)
 Subjective:    Patient ID: Craig Bruce, male    DOB: 13-May-1956, 68 y.o.   MRN: [id number]  HPI  Patient presents to clinic today for his annual exam.  Flu: 12/2022 Tetanus: 02/2015 COVID: Pfizer x 3 Pneumovax: Never Prevnar: Never Shingrix: Never PSA screening: 09/2022 Colon screening: 08/2020, every 3 years Lung cancer screening: 07/2021 Vision screening: as needed Dentist: as needed  Diet: He does not eat much meat. He consumes fruits and veggies. He tries to avoid fried foods. He drinks mostly water, soda. Exercise: Raising chickens  Review of Systems     Past Medical History:  Diagnosis Date   Annual physical exam 03/24/2015   Arthritis    BOTH HANDS   Chronic kidney disease    H/O KIDNEY STONES   DVT (deep venous thrombosis) (HCC) 1996   Gastritis    Immunization due 03/24/2015   Need for influenza vaccination 03/24/2015   Obesity (BMI 30.0-34.9) 02/17/2015   Smoker 02/17/2015    Current Outpatient Medications  Medication Sig Dispense Refill   aspirin  81 MG EC tablet Take 1 tablet (81 mg total) by mouth daily. Swallow whole. (Patient not taking: Reported on 10/19/2023) 30 tablet 12   cetirizine  (ZYRTEC  ALLERGY) 10 MG tablet Take 1 tablet (10 mg total) by mouth daily. 30 tablet 0   ezetimibe  (ZETIA ) 10 MG tablet Take 1 tablet (10 mg total) by mouth daily. 90 tablet 0   fluticasone  (FLONASE ) 50 MCG/ACT nasal spray Place 2 sprays into both nostrils daily. Use for 4-6 weeks then stop and use seasonally or as needed. 16 g 3   insulin  glargine (LANTUS  SOLOSTAR) 100 UNIT/ML Solostar Pen INJECT 10 UNITS INTO THE SKIN AT BEDTIME. 15 mL 0   Insulin  Pen Needle 31G X 5 MM MISC BD Pen Needles- brand specific Inject insulin  via insulin  pen 6 x daily 90 each 1   lisinopril  (ZESTRIL ) 5 MG tablet Take 1 tablet (5 mg total) by mouth daily. 90 tablet 1   OZEMPIC , 0.25 OR 0.5 MG/DOSE, 2 MG/3ML SOPN Inject 0.5 mg into the skin once a week. Start with 0.25mg  weekly x 4 weeks then  increase to 0.5mg  weekly injection. 9 mL 0   rosuvastatin  (CRESTOR ) 40 MG tablet Take 1 tablet (40 mg total) by mouth daily. (Patient not taking: Reported on 10/19/2023) 90 tablet 1   tamsulosin  (FLOMAX ) 0.4 MG CAPS capsule Take 1 capsule (0.4 mg total) by mouth daily after breakfast. 90 capsule 0   traZODone  (DESYREL ) 50 MG tablet Take 50 mg by mouth at bedtime as needed.     No current facility-administered medications for this visit.    Allergies  Allergen Reactions   Other     BLOOD THINNER USED 20 YEARS AGO-PT UNSURE EXACTLY OF NAME-CAUSED FEVERS OF 104 PER PT    Family History  Problem Relation Age of Onset   Alcohol abuse Mother    Alcohol abuse Father    Prostate cancer Neg Hx    Kidney cancer Neg Hx    Kidney failure Neg Hx    Colon cancer Neg Hx     Social History   Socioeconomic History   Marital status: Significant Other    Spouse name: Not on file   Number of children: 1   Years of education: Not on file   Highest education level: Not on file  Occupational History   Occupation: retired    Comment: Retired 2022  Tobacco Use   Smoking status: Every Day  Current packs/day: 1.00    Average packs/day: 1 pack/day for 24.0 years (24.0 ttl pk-yrs)    Types: Cigarettes   Smokeless tobacco: Never   Tobacco comments:    Previously smoked 1ppd.  Cutting back and down from 5 cigs / day to 4 cigs per day in 4 weeks.  Vaping Use   Vaping status: Never Used  Substance and Sexual Activity   Alcohol use: Yes    Alcohol/week: 2.0 standard drinks of alcohol    Types: 2 Cans of beer per week    Comment: BEER OCC   Drug use: No   Sexual activity: Not Currently  Other Topics Concern   Not on file  Social History Narrative   Not on file   Social Drivers of Health   Financial Resource Strain: Low Risk  (03/17/2023)   Overall Financial Resource Strain (CARDIA)    Difficulty of Paying Living Expenses: Not hard at all  Food Insecurity: No Food Insecurity (03/17/2023)    Hunger Vital Sign    Worried About Running Out of Food in the Last Year: Never true    Ran Out of Food in the Last Year: Never true  Transportation Needs: No Transportation Needs (03/17/2023)   PRAPARE - Administrator, Civil Service (Medical): No    Lack of Transportation (Non-Medical): No  Physical Activity: Sufficiently Active (03/17/2023)   Exercise Vital Sign    Days of Exercise per Week: 7 days    Minutes of Exercise per Session: 40 min  Stress: No Stress Concern Present (03/17/2023)   [employer] of Occupational Health - Occupational Stress Questionnaire    Feeling of Stress : Not at all  Social Connections: Moderately Isolated (03/17/2023)   Social Connection and Isolation Panel [NHANES]    Frequency of Communication with Friends and Family: Twice a week    Frequency of Social Gatherings with Friends and Family: Never    Attends Religious Services: More than 4 times per year    Active Member of [employer] or Organizations: No    Attends Banker Meetings: Never    Marital Status: Living with partner  Intimate Partner Violence: Not At Risk (03/17/2023)   Humiliation, Afraid, Rape, and Kick questionnaire    Fear of Current or Ex-Partner: No    Emotionally Abused: No    Physically Abused: No    Sexually Abused: No     Constitutional: Denies fever, malaise, fatigue, headache or abrupt weight changes.  HEENT: Denies eye pain, eye redness, ear pain, ringing in the ears, wax buildup, runny nose, nasal congestion, bloody nose, or sore throat. Respiratory: Denies difficulty breathing, shortness of breath, cough or sputum production.   Cardiovascular: Denies chest pain, chest tightness, palpitations or swelling in the hands or feet.  Gastrointestinal: Denies abdominal pain, bloating, constipation, diarrhea or blood in the stool.  GU: Denies urgency, frequency, pain with urination, burning sensation, blood in urine, odor or discharge. Musculoskeletal:   Denies decrease in range of motion, difficulty with gait, muscle pain or joint pain or swelling.  Skin: Denies redness, rashes, lesions or ulcercations.  Neurological: Patient reports insomnia.  Denies dizziness, difficulty with memory, difficulty with speech or problems with balance and coordination.  Psych: Denies anxiety, depression, SI/HI.  No other specific complaints in a complete review of systems (except as listed in HPI above).  Objective:   Physical Exam  BP 118/72 (BP Location: Left Arm, Patient Position: Sitting, Cuff Size: Normal)   Ht 5\' 7"  (1.702 m)  Wt 197 lb (89.4 kg)   BMI 30.85 kg/m    Wt Readings from Last 3 Encounters:  10/19/23 203 lb 2 oz (92.1 kg)  04/11/23 202 lb 3.2 oz (91.7 kg)  01/07/23 204 lb (92.5 kg)    General: Appears his stated age, obese, in NAD. Skin: Warm, dry and intact. No ulcerations noted. HEENT: Head: normal shape and size; Eyes: sclera white, no icterus, conjunctiva pink, PERRLA and EOMs intact;  Neck:  Neck supple, trachea midline. No masses, lumps or thyromegaly present.  Cardiovascular: Normal rate and rhythm. S1,S2 noted.  No murmur, rubs or gallops noted. No JVD or BLE edema. No carotid bruits noted. Pulmonary/Chest: Normal effort and diminished breath sounds. No respiratory distress. No wheezes, rales or ronchi noted.  Abdomen: Normal bowel sounds.  Musculoskeletal: Kyphotic.  Strength 5/5 BUE/BLE.  No difficulty with gait.  Neurological: Alert and oriented. Cranial nerves II-XII grossly intact. Coordination normal.  Psychiatric: Mood and affect normal. Behavior is normal. Judgment and thought content normal.     BMET    Component Value Date/Time   NA 136 04/11/2023 0843   NA 140 03/25/2015 0802   NA 137 05/19/2013 0254   K 4.3 04/11/2023 0843   K 3.9 05/19/2013 0254   CL 100 04/11/2023 0843   CL 107 05/19/2013 0254   CO2 29 04/11/2023 0843   CO2 25 05/19/2013 0254   GLUCOSE 364 (H) 04/11/2023 0843   GLUCOSE 124 (H)  05/19/2013 0254   BUN 10 04/11/2023 0843   BUN 15 03/25/2015 0802   BUN 14 05/19/2013 0254   CREATININE 0.86 04/11/2023 0843   CALCIUM  9.1 04/11/2023 0843   CALCIUM  8.5 05/19/2013 0254   GFRNONAA >60 11/01/2022 1151   GFRNONAA 96 11/03/2017 0821   GFRAA >60 12/09/2018 0500   GFRAA 111 11/03/2017 0821    Lipid Panel     Component Value Date/Time   CHOL 229 (H) 04/11/2023 0843   CHOL 243 (H) 03/25/2015 0802   TRIG 376 (H) 04/11/2023 0843   HDL 49 04/11/2023 0843   HDL 38 (L) 03/25/2015 0802   CHOLHDL 4.7 04/11/2023 0843   VLDL 48 (H) 10/01/2016 1140   LDLCALC 126 (H) 04/11/2023 0843    CBC    Component Value Date/Time   WBC 6.3 04/11/2023 0843   RBC 5.67 04/11/2023 0843   HGB 17.4 (H) 04/11/2023 0843   HGB 16.0 03/25/2015 0802   HCT 52.2 (H) 04/11/2023 0843   HCT 45.1 03/25/2015 0802   PLT 199 04/11/2023 0843   PLT 190 03/25/2015 0802   MCV 92.1 04/11/2023 0843   MCV 90 03/25/2015 0802   MCV 89 05/19/2013 0254   MCH 30.7 04/11/2023 0843   MCHC 33.3 04/11/2023 0843   RDW 12.1 04/11/2023 0843   RDW 13.4 03/25/2015 0802   RDW 13.2 05/19/2013 0254   LYMPHSABS 2.2 12/09/2018 0500   LYMPHSABS 1.6 03/25/2015 0802   LYMPHSABS 1.4 05/19/2013 0254   MONOABS 0.8 12/09/2018 0500   MONOABS 1.2 (H) 05/19/2013 0254   EOSABS 0.1 12/09/2018 0500   EOSABS 0.2 03/25/2015 0802   EOSABS 0.1 05/19/2013 0254   BASOSABS 0.0 12/09/2018 0500   BASOSABS 0.0 03/25/2015 0802   BASOSABS 0.1 05/19/2013 0254    Hgb A1C Lab Results  Component Value Date   HGBA1C 12.0 (H) 04/11/2023            Assessment & Plan:   Preventative Health Maintenance:  Encouraged him to get a flu shot in the  fall Tetanus UTD Encouraged him to get his COVID booster Prevnar 20 today Discussed Shingrix vaccine, he will check coverage with his insurance company and schedule a visit if he would like to have this done Referral to GI for screening colonoscopy He declines CT lung cancer  screening Encouraged him to consume a balanced diet and exercise regimen Advised him to see an eye doctor and dentist annually We will check CBC, c-Met, lipid, A1c and PSA today  RTC in 3 months, follow-up chronic conditions Helayne Lo, NP

## 2023-10-26 NOTE — Assessment & Plan Note (Signed)
 Encourage diet and exercise for weight loss

## 2023-10-26 NOTE — Patient Instructions (Signed)
 Health Maintenance, Male  Adopting a healthy lifestyle and getting preventive care are important in promoting health and wellness. Ask your health care provider about:  The right schedule for you to have regular tests and exams.  Things you can do on your own to prevent diseases and keep yourself healthy.  What should I know about diet, weight, and exercise?  Eat a healthy diet    Eat a diet that includes plenty of vegetables, fruits, low-fat dairy products, and lean protein.  Do not eat a lot of foods that are high in solid fats, added sugars, or sodium.  Maintain a healthy weight  Body mass index (BMI) is a measurement that can be used to identify possible weight problems. It estimates body fat based on height and weight. Your health care provider can help determine your BMI and help you achieve or maintain a healthy weight.  Get regular exercise  Get regular exercise. This is one of the most important things you can do for your health. Most adults should:  Exercise for at least 150 minutes each week. The exercise should increase your heart rate and make you sweat (moderate-intensity exercise).  Do strengthening exercises at least twice a week. This is in addition to the moderate-intensity exercise.  Spend less time sitting. Even light physical activity can be beneficial.  Watch cholesterol and blood lipids  Have your blood tested for lipids and cholesterol at 69 years of age, then have this test every 5 years.  You may need to have your cholesterol levels checked more often if:  Your lipid or cholesterol levels are high.  You are older than 69 years of age.  You are at high risk for heart disease.  What should I know about cancer screening?  Many types of cancers can be detected early and may often be prevented. Depending on your health history and family history, you may need to have cancer screening at various ages. This may include screening for:  Colorectal cancer.  Prostate cancer.  Skin cancer.  Lung  cancer.  What should I know about heart disease, diabetes, and high blood pressure?  Blood pressure and heart disease  High blood pressure causes heart disease and increases the risk of stroke. This is more likely to develop in people who have high blood pressure readings or are overweight.  Talk with your health care provider about your target blood pressure readings.  Have your blood pressure checked:  Every 3-5 years if you are 9-95 years of age.  Every year if you are 85 years old or older.  If you are between the ages of 29 and 29 and are a current or former smoker, ask your health care provider if you should have a one-time screening for abdominal aortic aneurysm (AAA).  Diabetes  Have regular diabetes screenings. This checks your fasting blood sugar level. Have the screening done:  Once every three years after age 23 if you are at a normal weight and have a low risk for diabetes.  More often and at a younger age if you are overweight or have a high risk for diabetes.  What should I know about preventing infection?  Hepatitis B  If you have a higher risk for hepatitis B, you should be screened for this virus. Talk with your health care provider to find out if you are at risk for hepatitis B infection.  Hepatitis C  Blood testing is recommended for:  Everyone born from [birth year] through 1966.  Anyone  with known risk factors for hepatitis C.  Sexually transmitted infections (STIs)  You should be screened each year for STIs, including gonorrhea and chlamydia, if:  You are sexually active and are younger than 69 years of age.  You are older than 69 years of age and your health care provider tells you that you are at risk for this type of infection.  Your sexual activity has changed since you were last screened, and you are at increased risk for chlamydia or gonorrhea. Ask your health care provider if you are at risk.  Ask your health care provider about whether you are at high risk for HIV. Your health care provider  may recommend a prescription medicine to help prevent HIV infection. If you choose to take medicine to prevent HIV, you should first get tested for HIV. You should then be tested every 3 months for as long as you are taking the medicine.  Follow these instructions at home:  Alcohol use  Do not drink alcohol if your health care provider tells you not to drink.  If you drink alcohol:  Limit how much you have to 0-2 drinks a day.  Know how much alcohol is in your drink. In the [place] one drink equals one 12 oz bottle of beer (355 mL), one 5 oz glass of wine (148 mL), or one 1 oz glass of hard liquor (44 mL).  Lifestyle  Do not use any products that contain nicotine or tobacco. These products include cigarettes, chewing tobacco, and vaping devices, such as e-cigarettes. If you need help quitting, ask your health care provider.  Do not use street drugs.  Do not share needles.  Ask your health care provider for help if you need support or information about quitting drugs.  General instructions  Schedule regular health, dental, and eye exams.  Stay current with your vaccines.  Tell your health care provider if:  You often feel depressed.  You have ever been abused or do not feel safe at home.  Summary  Adopting a healthy lifestyle and getting preventive care are important in promoting health and wellness.  Follow your health care provider's instructions about healthy diet, exercising, and getting tested or screened for diseases.  Follow your health care provider's instructions on monitoring your cholesterol and blood pressure.  This information is not intended to replace advice given to you by your health care provider. Make sure you discuss any questions you have with your health care provider.  Document Revised: 09/29/2020 Document Reviewed: 09/29/2020  Elsevier Patient Education  2024 [employer].

## 2023-10-27 ENCOUNTER — Ambulatory Visit: Payer: Self-pay | Admitting: Internal Medicine

## 2023-10-27 LAB — LIPID PANEL
Cholesterol: 336 mg/dL — ABNORMAL HIGH (ref <200)
HDL: 62 mg/dL (ref >=40)
LDL Cholesterol (Calc): 235 mg/dL — ABNORMAL HIGH
Non-HDL Cholesterol (Calc): 274 mg/dL — ABNORMAL HIGH (ref <130)
Total CHOL/HDL Ratio: 5.4 (calc) — ABNORMAL HIGH (ref <5.0)
Triglycerides: 201 mg/dL — ABNORMAL HIGH (ref <150)

## 2023-10-27 LAB — CBC
HCT: 55.7 % — ABNORMAL HIGH (ref 38.5–50.0)
Hemoglobin: 18.5 g/dL — ABNORMAL HIGH (ref 13.2–17.1)
MCH: 30.5 pg (ref 27.0–33.0)
MCHC: 33.2 g/dL (ref 32.0–36.0)
MCV: 91.9 fL (ref 80.0–100.0)
MPV: 10.1 fL (ref 7.5–12.5)
Platelets: 218 10*3/uL (ref 140–400)
RBC: 6.06 10*6/uL — ABNORMAL HIGH (ref 4.20–5.80)
RDW: 13 % (ref 11.0–15.0)
WBC: 8.7 10*3/uL (ref 3.8–10.8)

## 2023-10-27 LAB — COMPREHENSIVE METABOLIC PANEL WITH GFR
AG Ratio: 1.5 (calc) (ref 1.0–2.5)
ALT: 19 U/L (ref 9–46)
AST: 18 U/L (ref 10–35)
Albumin: 4.3 g/dL (ref 3.6–5.1)
Alkaline phosphatase (APISO): 87 U/L (ref 35–144)
BUN: 14 mg/dL (ref 7–25)
CO2: 26 mmol/L (ref 20–32)
Calcium: 9 mg/dL (ref 8.6–10.3)
Chloride: 104 mmol/L (ref 98–110)
Creat: 0.74 mg/dL (ref 0.70–1.35)
Globulin: 2.9 g/dL (ref 1.9–3.7)
Glucose, Bld: 178 mg/dL — ABNORMAL HIGH (ref 65–99)
Potassium: 4.3 mmol/L (ref 3.5–5.3)
Sodium: 140 mmol/L (ref 135–146)
Total Bilirubin: 0.9 mg/dL (ref 0.2–1.2)
Total Protein: 7.2 g/dL (ref 6.1–8.1)
eGFR: 99 mL/min/{1.73_m2} (ref >=60)

## 2023-10-27 LAB — HEMOGLOBIN A1C
Hgb A1c MFr Bld: 8.9 % — ABNORMAL HIGH (ref <5.7)
Mean Plasma Glucose: 209 mg/dL
eAG (mmol/L): 11.6 mmol/L

## 2023-10-27 LAB — PSA: PSA: 5.51 ng/mL — ABNORMAL HIGH (ref <=4.00)

## 2023-11-02 ENCOUNTER — Telehealth: Payer: Self-pay

## 2023-11-02 NOTE — Telephone Encounter (Signed)
 Contacted patient to schedule his 3 year repeat colonoscopy with Dr. Antony Baumgartner.  He said he is unable to schedule it right now because he is helping to take care of a friend who has recently had a leg amputation.  He does not know when he will be able to schedule.  Informed him that I will mail a reminder letter and he can call anytime within 1 year to schedule.  Thanks, [place], [place]

## 2023-11-09 ENCOUNTER — Other Ambulatory Visit: Payer: Self-pay | Admitting: Internal Medicine

## 2023-11-10 ENCOUNTER — Other Ambulatory Visit: Payer: Self-pay | Admitting: Internal Medicine

## 2023-11-10 NOTE — Telephone Encounter (Signed)
 Requested Prescriptions  Pending Prescriptions Disp Refills   rosuvastatin  (CRESTOR ) 40 MG tablet [Pharmacy Med Name: ROSUVASTATIN  CALCIUM  40 MG TAB] 90 tablet 1    Sig: TAKE 1 TABLET BY MOUTH EVERY DAY     Cardiovascular:  Antilipid - Statins 2 Failed - 11/10/2023  5:49 PM      Failed - Valid encounter within last 12 months    Recent Outpatient Visits           2 weeks ago Encounter for general adult medical examination with abnormal findings   [facility] [place], [place], NP   3 weeks ago Seasonal allergic rhinitis due to pollen   [facility] [place], [place], NP              Failed - Lipid Panel in normal range within the last 12 months    Cholesterol, Total  Date Value Ref Range Status  03/25/2015 243 (H) 100 - 199 mg/dL Final   Cholesterol  Date Value Ref Range Status  10/26/2023 336 (H) <200 mg/dL Final   LDL Cholesterol (Calc)  Date Value Ref Range Status  10/26/2023 235 (H) mg/dL (calc) Final    Comment:    LDL-C levels > or = 190 mg/dL may indicate familial  hypercholesterolemia (FH). Clinical assessment and  measurement of blood lipid levels should be  considered for all first degree relatives of patients with an FH diagnosis. LDL Cholesterol (LDL-C) levels > or = 300 mg/dL may indicate homozygous familial hypercholesterolemia (HoFH). Untreated,  these extremely high LDL-C levels can result in premature CV events and mortality. Patients should be identified early and provided appropriate interventions to reduce the cumulative LDL-C burden from birth. . For questions about testing for familial hypercholesterolemia, please call Engineer, materials at 1.866.GENE.INFO. Norberto Bear, et al. J National Lipid Association  Recommendations for Patient-Centered Management of Dyslipidemia: Part 1 Journal of  Clinical Lipidology 2015;9(2), 129-169. Cuchel, M. et al. (2014). Homozygous  familial hypercholesterolaemia: new insights and guidance for clinicians to improve detection and clinical management. European Heart Journal, 35(32), 4081632858. Reference range: <100 . Desirable range <100 mg/dL for primary prevention;   <70 mg/dL for patients with CHD or diabetic patients  with > or = 2 CHD risk factors. Aaron Aas LDL-C is now calculated using the Martin-Hopkins  calculation, which is a validated novel method providing  better accuracy than the Friedewald equation in the  estimation of LDL-C.  Melinda Sprawls et al. Erroll Heard. 9562;130(86): 2061-2068  (http://education.[facility].com/faq/FAQ164)    HDL  Date Value Ref Range Status  10/26/2023 62 > OR = 40 mg/dL Final  57/84/6962 38 (L) >39 mg/dL Final    Comment:    According to ATP-III Guidelines, HDL-C >59 mg/dL is considered a negative risk factor for CHD.    Triglycerides  Date Value Ref Range Status  10/26/2023 201 (H) <150 mg/dL Final    Comment:    . If a non-fasting specimen was collected, consider repeat triglyceride testing on a fasting specimen if clinically indicated.  Imagene Mam et al. J. of Clin. Lipidol. 2015;9:129-169. Aaron Aas          Passed - Cr in normal range and within 360 days    Creat  Date Value Ref Range Status  10/26/2023 0.74 0.70 - 1.35 mg/dL Final   Creatinine, Urine  Date Value Ref Range Status  04/11/2023 41 20 - 320 mg/dL Final         Passed -  Patient is not pregnant

## 2023-11-11 NOTE — Telephone Encounter (Signed)
 Requested medications are due for refill today.  yes  Requested medications are on the active medications list.  yes  Last refill. 10/19/2023 #30 0 rf  Future visit scheduled.   yes  Notes to clinic.  New medication to this pt.     Requested Prescriptions  Pending Prescriptions Disp Refills   cetirizine  (ZYRTEC ) 10 MG tablet [Pharmacy Med Name: CETIRIZINE  HCL 10 MG TABLET] 90 tablet 1    Sig: TAKE 1 TABLET BY MOUTH EVERY DAY     Ear, Nose, and Throat:  Antihistamines 2 Failed - 11/11/2023  6:00 PM      Failed - Valid encounter within last 12 months    Recent Outpatient Visits           2 weeks ago Encounter for general adult medical examination with abnormal findings   [facility] [place], [place] W, NP   3 weeks ago Seasonal allergic rhinitis due to pollen   [facility] [place], [place] Buzzard, NP              Passed - Cr in normal range and within 360 days    Creat  Date Value Ref Range Status  10/26/2023 0.74 0.70 - 1.35 mg/dL Final   Creatinine, Urine  Date Value Ref Range Status  04/11/2023 41 20 - 320 mg/dL Final

## 2023-11-30 ENCOUNTER — Other Ambulatory Visit: Payer: Self-pay | Admitting: Internal Medicine

## 2023-11-30 NOTE — Telephone Encounter (Signed)
 Copied from CRM [id number]. Topic: Clinical - Medication Refill >> Nov 30, 2023  9:09 AM Myrick T wrote: Medication: OZEMPIC , 0.25 OR 0.5 MG/DOSE, 2 MG/3ML SOPN and insulin  glargine (LANTUS  SOLOSTAR) 100 UNIT/ML Solostar Pen  Has the patient contacted their pharmacy? Yes  This is the patient's preferred pharmacy:  [id number] - [place], [place] - [address] [place] [address] Phone: [phone] Fax: [phone]  Is this the correct pharmacy for this prescription? Yes  Has the prescription been filled recently? Yes  Is the patient out of the medication? Yes  Has the patient been seen for an appointment in the last year OR does the patient have an upcoming appointment? Yes  Can we respond through MyChart? No  Agent: Please be advised that Rx refills may take up to 3 business days. We ask that you follow-up with your pharmacy.

## 2023-12-02 MED ORDER — LANTUS SOLOSTAR 100 UNIT/ML ~~LOC~~ SOPN: 10.0000 [IU] | PEN_INJECTOR | Freq: Every day | SUBCUTANEOUS | 0 refills | Status: DC

## 2023-12-02 MED ORDER — OZEMPIC (0.25 OR 0.5 MG/DOSE) 2 MG/3ML ~~LOC~~ SOPN: 0.5000 mg | PEN_INJECTOR | SUBCUTANEOUS | 0 refills | Status: DC

## 2023-12-02 NOTE — Telephone Encounter (Signed)
 Labs are in date  Requested Prescriptions  Pending Prescriptions Disp Refills   OZEMPIC , 0.25 OR 0.5 MG/DOSE, 2 MG/3ML SOPN 9 mL 0    Sig: Inject 0.5 mg into the skin once a week. Start with 0.25mg  weekly x 4 weeks then increase to 0.5mg  weekly injection.     Endocrinology:  Diabetes - GLP-1 Receptor Agonists - semaglutide  Failed - 12/02/2023 10:22 AM      Failed - HBA1C in normal range and within 180 days    Hgb A1c MFr Bld  Date Value Ref Range Status  10/26/2023 8.9 (H) <5.7 % Final    Comment:    For someone without known diabetes, a hemoglobin A1c value of 6.5% or greater indicates that they may have  diabetes and this should be confirmed with a follow-up  test. . For someone with known diabetes, a value <7% indicates  that their diabetes is well controlled and a value  greater than or equal to 7% indicates suboptimal  control. A1c targets should be individualized based on  duration of diabetes, age, comorbid conditions, and  other considerations. . Currently, no consensus exists regarding use of hemoglobin A1c for diagnosis of diabetes for children. .          Passed - Cr in normal range and within 360 days    Creat  Date Value Ref Range Status  10/26/2023 0.74 0.70 - 1.35 mg/dL Final   Creatinine, Urine  Date Value Ref Range Status  04/11/2023 41 20 - 320 mg/dL Final         Passed - Valid encounter within last 6 months    Recent Outpatient Visits           1 month ago Encounter for general adult medical examination with abnormal findings   [facility] [place], [place] W, NP   1 month ago Seasonal allergic rhinitis due to pollen   [facility] [place], Angeline ORN, NP               insulin  glargine (LANTUS  SOLOSTAR) 100 UNIT/ML Solostar Pen 15 mL 0    Sig: Inject 10 Units into the skin at bedtime.     Endocrinology:  Diabetes - Insulins Failed - 12/02/2023 10:22 AM      Failed - HBA1C is between 0 and  7.9 and within 180 days    Hgb A1c MFr Bld  Date Value Ref Range Status  10/26/2023 8.9 (H) <5.7 % Final    Comment:    For someone without known diabetes, a hemoglobin A1c value of 6.5% or greater indicates that they may have  diabetes and this should be confirmed with a follow-up  test. . For someone with known diabetes, a value <7% indicates  that their diabetes is well controlled and a value  greater than or equal to 7% indicates suboptimal  control. A1c targets should be individualized based on  duration of diabetes, age, comorbid conditions, and  other considerations. . Currently, no consensus exists regarding use of hemoglobin A1c for diagnosis of diabetes for children. SABRA Amy - Valid encounter within last 6 months    Recent Outpatient Visits           1 month ago Encounter for general adult medical examination with abnormal findings   [facility] [place], [place] W, NP   1 month ago Seasonal allergic rhinitis due to  pollen   [facility] [place], Angeline ORN, [place]

## 2024-01-14 ENCOUNTER — Other Ambulatory Visit: Payer: Self-pay | Admitting: Internal Medicine

## 2024-01-16 NOTE — Telephone Encounter (Signed)
 Rx 10/19/23 16g 3RF- too soon Requested Prescriptions  Pending Prescriptions Disp Refills   fluticasone  (FLONASE ) 50 MCG/ACT nasal spray [Pharmacy Med Name: FLUTICASONE  PROP 50 MCG SPRAY] 48 mL 1    Sig: PLACE 2 SPRAYS INTO BOTH NOSTRILS DAILY. USE FOR 4-6 WEEKS THEN STOP AND USE SEASONALLY OR AS NEEDED.     Ear, Nose, and Throat: Nasal Preparations - Corticosteroids Passed - 01/16/2024  2:41 PM      Passed - Valid encounter within last 12 months    Recent Outpatient Visits           2 months ago Encounter for general adult medical examination with abnormal findings    [facility] [place], [place], NP   2 months ago Seasonal allergic rhinitis due to pollen   [facility] [place], Angeline ORN, NP

## 2024-01-19 IMAGING — CT CT CHEST LUNG CANCER SCREENING LOW DOSE W/O CM
2 of 9 series · 13 of 40 positions shown, 16 images · non-contrast
Comparison: None.

CLINICAL DATA: Current smoker with 24 pack-year history



[Series 11: thins lung 1.00 · axial · 0.63mm/px · z∈[-1162,-908]mm · 12 of 375 slices shown, 15 images]
[im 29/375  mediastinal]
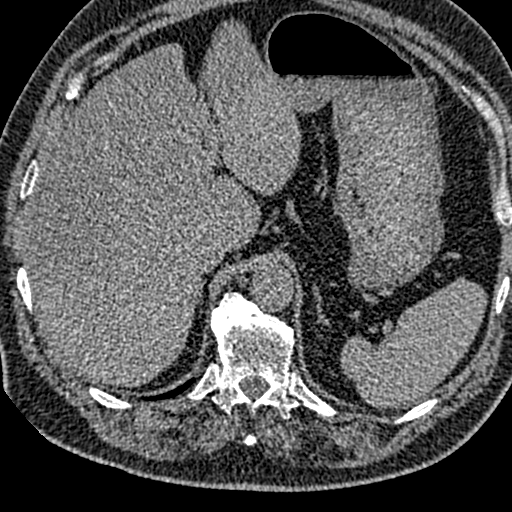
[im 29/375  lung]
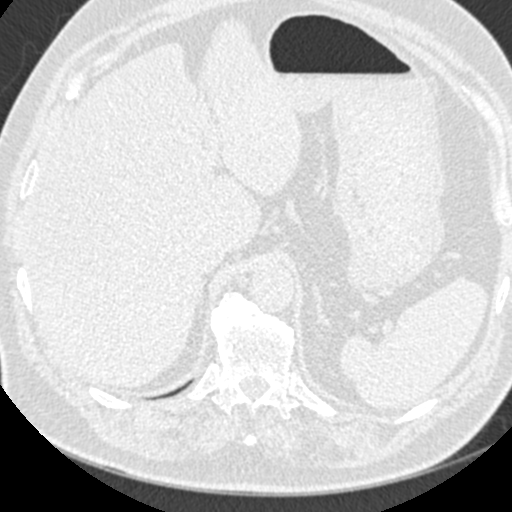
[im 58/375  lung]
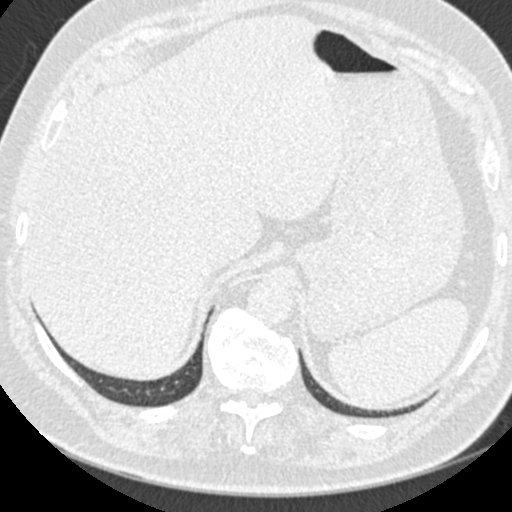
[im 87/375  lung]
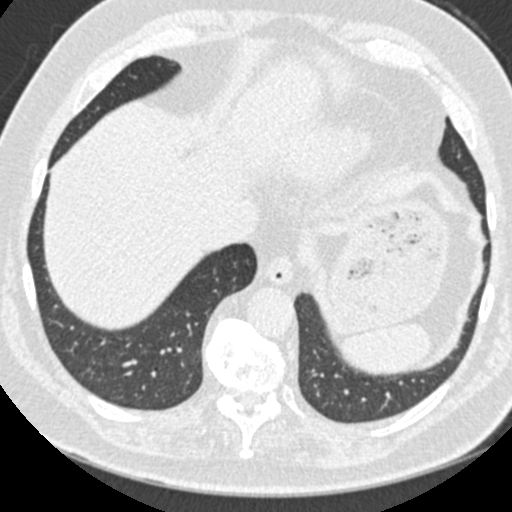
[im 116/375  lung]
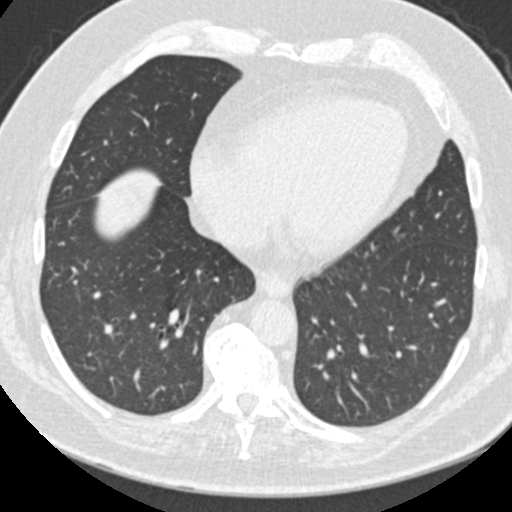
[im 144/375  mediastinal]
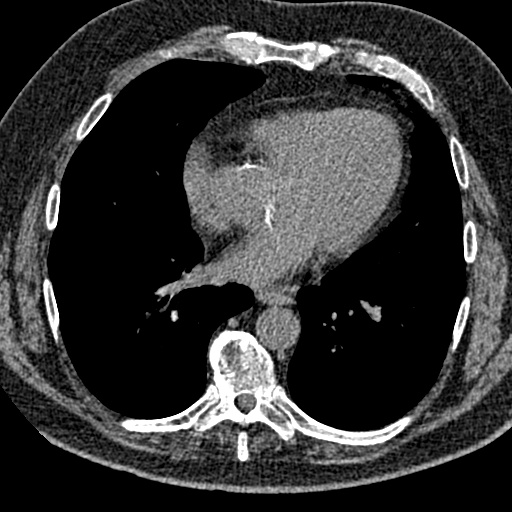
[im 144/375  lung]
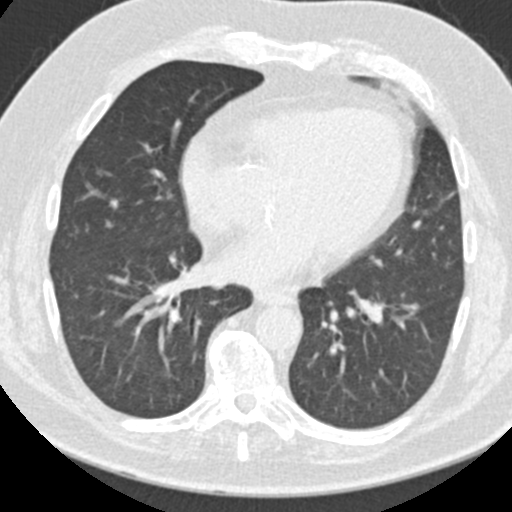
[im 173/375  lung]
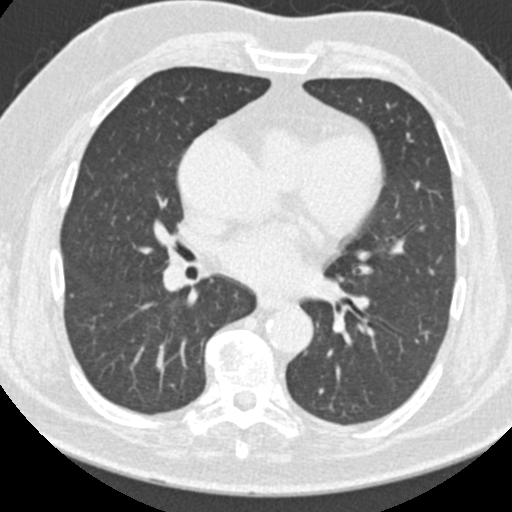
[im 202/375  lung]
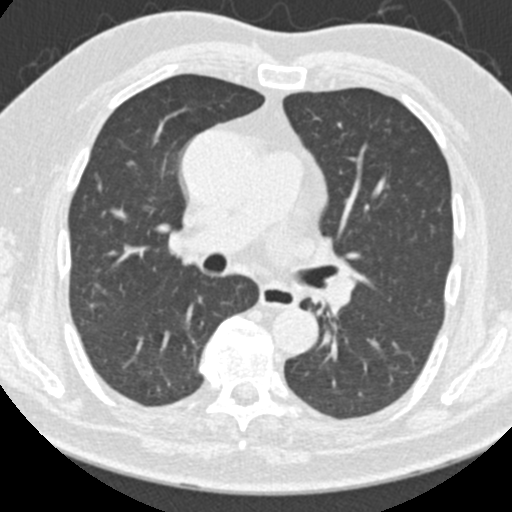
[im 231/375  lung]
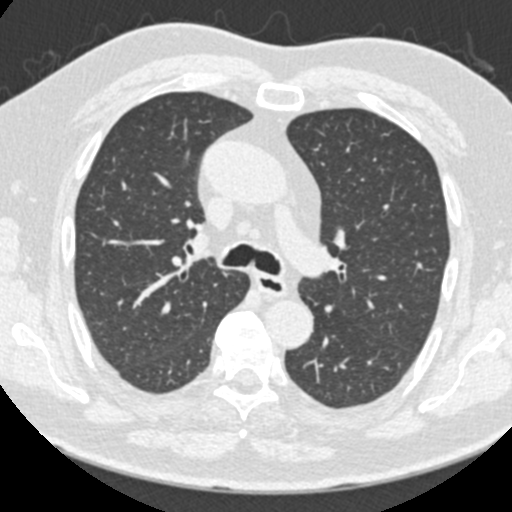
[im 259/375  mediastinal]
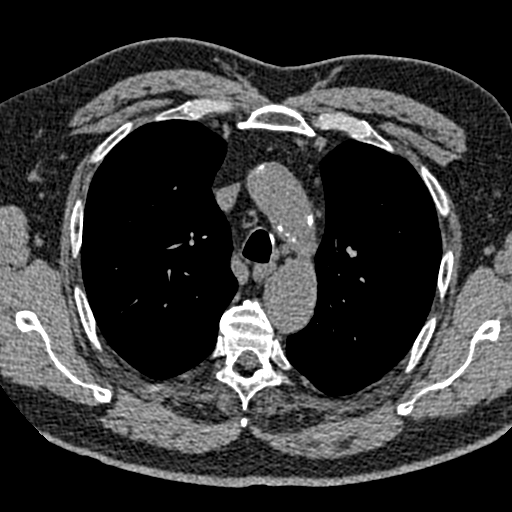
[im 259/375  lung]
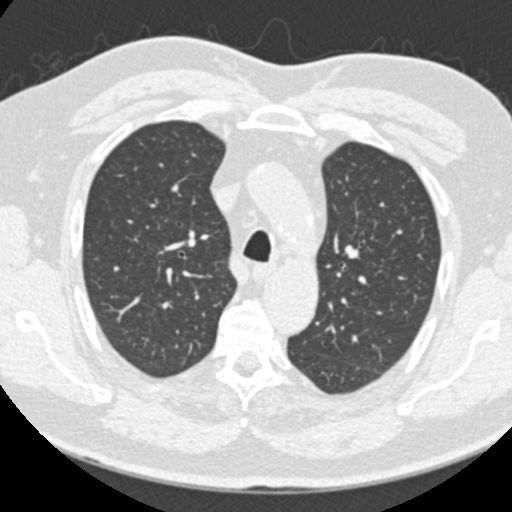
[im 288/375  lung]
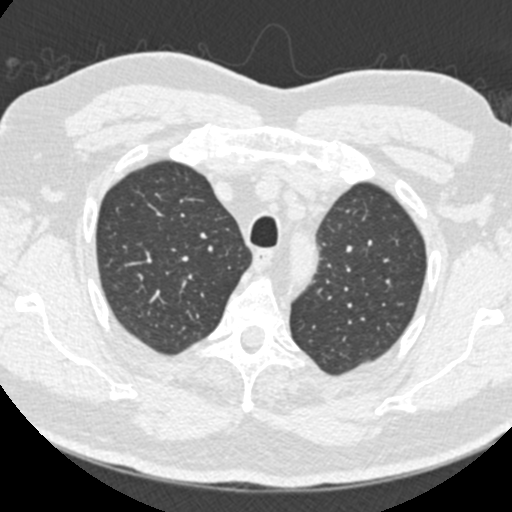
[im 317/375  lung]
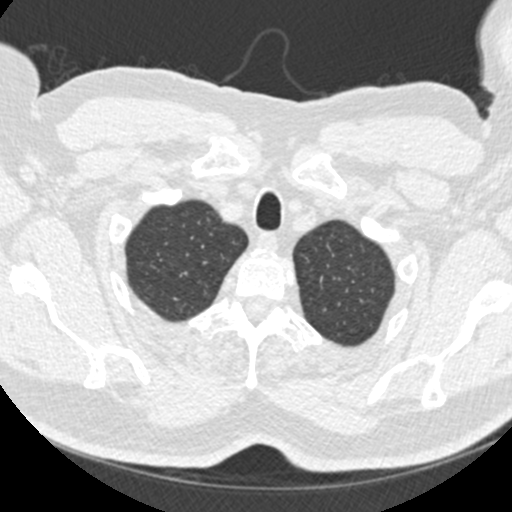
[im 346/375  lung]
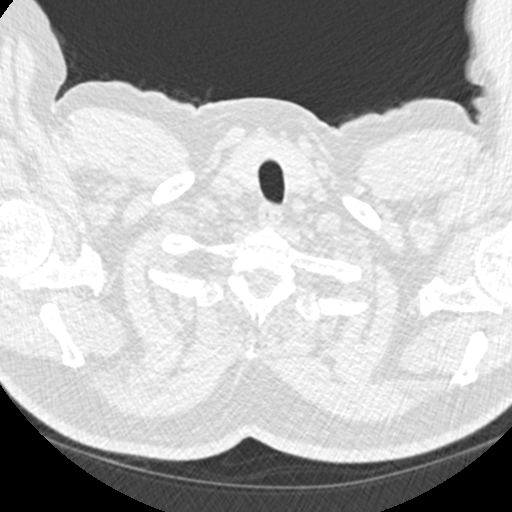

[Series 12: coronals lung 1.00 cor · coronal · 0.59mm/px · 1 of 322 slices shown]
[im 161/322  lung]
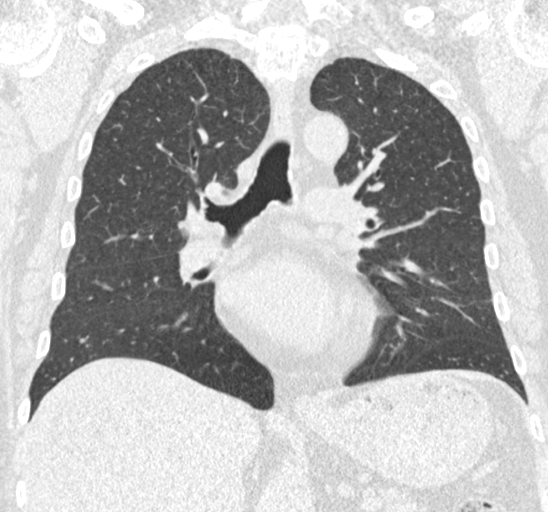

[13 of 40 positions shown; findings below may reference images not displayed]

FINDINGS: Cardiovascular: Normal heart size. No pericardial effusion. Aortic
valvular calcifications. Atherosclerotic disease of the thoracic
aorta. Dilated ascending thoracic aorta measuring up to 4.8 cm.

Mediastinum/Nodes: Esophagus and thyroid are unremarkable. Mildly
enlarged mediastinal lymph nodes which are likely reactive. No
pathologically enlarged lymph nodes seen in the chest.

Lungs/Pleura: Central airways are patent. Mild centrilobular
emphysema. No consolidation, pleural effusion or pneumothorax. Solid
pulmonary nodule of the right upper lobe measuring 6.3 mm in mean
diameter on image 99. Additional small solid pulmonary nodules are
seen which measure less than 4 mm.

Upper Abdomen: No acute abnormality.

Musculoskeletal: No chest wall mass or suspicious bone lesions
identified.
IMPRESSION: 1. Lung-RADS 3S, probably benign findings. Short-term follow-up in 6
months is recommended with repeat low-dose chest CT without contrast
(please use the following order, CT CHEST LCS NODULE FOLLOW-UP W/O
CM). S modifier for aortic valvular calcifications and aneurysm of
the ascending thoracic aorta.
2. Aortic valvular calcifications, which can be seen in the setting
of aortic sclerosis or stenosis recommend echocardiography for
further evaluation.
3. Aneurysm of the ascending thoracic aorta, measuring up to 4.8 cm.
Ascending thoracic aortic aneurysm. Recommend semi-annual imaging
followup by CTA or MRA and referral to cardiothoracic surgery if not
already obtained. This recommendation follows 4828
ACCF/AHA/AATS/ACR/ASA/SCA/MACKAY/AKIO/CARMELITA/VOLLMER Guidelines for the
Diagnosis and Management of Patients With Thoracic Aortic Disease.
Circulation. 4828; 121: E266-e369. Aortic aneurysm NOS (V4GF1-JNF.W)
4. Aortic Atherosclerosis (V4GF1-6BQ.Q) and Emphysema (V4GF1-OZ1.H).

## 2024-01-24 ENCOUNTER — Encounter: Payer: Self-pay | Admitting: Internal Medicine

## 2024-01-24 ENCOUNTER — Ambulatory Visit (INDEPENDENT_AMBULATORY_CARE_PROVIDER_SITE_OTHER): Admitting: Internal Medicine

## 2024-01-24 VITALS — BP 108/72 | HR 92 | Ht 67.0 in | Wt 205.2 lb

## 2024-01-24 DIAGNOSIS — I700 Atherosclerosis of aorta: Secondary | ICD-10-CM

## 2024-01-24 DIAGNOSIS — G4709 Other insomnia: Secondary | ICD-10-CM

## 2024-01-24 DIAGNOSIS — E785 Hyperlipidemia, unspecified: Secondary | ICD-10-CM

## 2024-01-24 DIAGNOSIS — D751 Secondary polycythemia: Secondary | ICD-10-CM

## 2024-01-24 DIAGNOSIS — E66811 Obesity, class 1: Secondary | ICD-10-CM

## 2024-01-24 DIAGNOSIS — J432 Centrilobular emphysema: Secondary | ICD-10-CM

## 2024-01-24 DIAGNOSIS — E1169 Type 2 diabetes mellitus with other specified complication: Secondary | ICD-10-CM

## 2024-01-24 DIAGNOSIS — I10 Essential (primary) hypertension: Secondary | ICD-10-CM | POA: Diagnosis not present

## 2024-01-24 DIAGNOSIS — M150 Primary generalized (osteo)arthritis: Secondary | ICD-10-CM

## 2024-01-24 DIAGNOSIS — I712 Thoracic aortic aneurysm, without rupture, unspecified: Secondary | ICD-10-CM | POA: Diagnosis not present

## 2024-01-24 DIAGNOSIS — Z794 Long term (current) use of insulin: Secondary | ICD-10-CM

## 2024-01-24 DIAGNOSIS — N400 Enlarged prostate without lower urinary tract symptoms: Secondary | ICD-10-CM

## 2024-01-24 DIAGNOSIS — Z7985 Long-term (current) use of injectable non-insulin antidiabetic drugs: Secondary | ICD-10-CM

## 2024-01-24 DIAGNOSIS — E1165 Type 2 diabetes mellitus with hyperglycemia: Secondary | ICD-10-CM

## 2024-01-24 MED ORDER — TRAZODONE HCL 50 MG PO TABS: 50.0000 mg | ORAL_TABLET | Freq: Every evening | ORAL | 0 refills | Status: AC | PRN

## 2024-01-24 NOTE — Progress Notes (Signed)
 Subjective:    Patient ID: Craig Bruce, male    DOB: 1955-10-02, 68 y.o.   MRN: [id number]  HPI  Patient presents to clinic today for 69-month follow-up of chronic conditions.  DM2: His last A1c was 8.9, 10/2023.  He is taking lantus  and ozempic  as prescribed. He is on lisinopril  for renal protection.  He does not check his sugars.  He checks his feet routinely.  His last eye exam was 04/2022.  Flu 12/2022.  Pneumovax unsure.  Prevnar 10/2023.  COVID Pfizer x 2.  OA: He denies any joint pain at this time. He is not taking any medications for this. He does not follow with orthopedics.  Insomnia: He has difficulty staying asleep.  He is taking trazodone  but reports it does not keep him asleep.  There is no sleep study on file.  HLD with aortic atherosclerosis: His last LDL was 235, triglycerides 20, 10/2023.  He is taking rosuvastatin  and ezetimibe  as prescribed.  He is not taking aspirin  as recommended.  He tries to consume low-fat diet.  BPH: He reports urinary frequency.  He is not taking flomax  as prescribed.  He does not follow with urology.  Thoracic aortic aneurysm: CT from 07/2021 reviewed.  He does not follow with vascular.  HTN: His BP today is 108/72.  He is taking lisinopril  as prescribed.  ECG from 10/2022 reviewed.  Emphysema: He denies chronic cough or shortness of breath.  He does continue to smoke.  He is not using any inhalers at this time.  CT lung cancer screening from 07/2021 reviewed.  Polycythemia: His last H/H was 18.5/55.7, 10/2023.  He does smoke.  He does not follow with hematology.  Review of Systems     Past Medical History:  Diagnosis Date   Annual physical exam 03/24/2015   Arthritis    BOTH HANDS   Chronic kidney disease    H/O KIDNEY STONES   DVT (deep venous thrombosis) (HCC) 1996   Gastritis    Immunization due 03/24/2015   Need for influenza vaccination 03/24/2015   Obesity (BMI 30.0-34.9) 02/17/2015   Smoker 02/17/2015    Current Outpatient  Medications  Medication Sig Dispense Refill   aspirin  81 MG EC tablet Take 1 tablet (81 mg total) by mouth daily. Swallow whole. (Patient not taking: Reported on 10/26/2023) 30 tablet 12   cetirizine  (ZYRTEC ) 10 MG tablet TAKE 1 TABLET BY MOUTH EVERY DAY 90 tablet 1   ezetimibe  (ZETIA ) 10 MG tablet Take 1 tablet (10 mg total) by mouth daily. 90 tablet 0   fluticasone  (FLONASE ) 50 MCG/ACT nasal spray Place 2 sprays into both nostrils daily. Use for 4-6 weeks then stop and use seasonally or as needed. 16 g 3   insulin  glargine (LANTUS  SOLOSTAR) 100 UNIT/ML Solostar Pen Inject 10 Units into the skin at bedtime. 15 mL 0   Insulin  Pen Needle 31G X 5 MM MISC BD Pen Needles- brand specific Inject insulin  via insulin  pen 6 x daily 90 each 1   lisinopril  (ZESTRIL ) 5 MG tablet Take 1 tablet (5 mg total) by mouth daily. 90 tablet 1   OZEMPIC , 0.25 OR 0.5 MG/DOSE, 2 MG/3ML SOPN Inject 0.5 mg into the skin once a week. Start with 0.25mg  weekly x 4 weeks then increase to 0.5mg  weekly injection. 9 mL 0   rosuvastatin  (CRESTOR ) 40 MG tablet TAKE 1 TABLET BY MOUTH EVERY DAY 90 tablet 1   tamsulosin  (FLOMAX ) 0.4 MG CAPS capsule Take 1 capsule (0.4 mg  total) by mouth daily after breakfast. 90 capsule 0   traZODone  (DESYREL ) 50 MG tablet Take 50 mg by mouth at bedtime as needed.     No current facility-administered medications for this visit.    Allergies  Allergen Reactions   Other     BLOOD THINNER USED 20 YEARS AGO-PT UNSURE EXACTLY OF NAME-CAUSED FEVERS OF 104 PER PT    Family History  Problem Relation Age of Onset   Alcohol abuse Mother    Alcohol abuse Father    Prostate cancer Neg Hx    Kidney cancer Neg Hx    Kidney failure Neg Hx    Colon cancer Neg Hx     Social History   Socioeconomic History   Marital status: Significant Other    Spouse name: Not on file   Number of children: 1   Years of education: Not on file   Highest education level: Not on file  Occupational History   Occupation:  retired    Comment: Retired 2022  Tobacco Use   Smoking status: Every Day    Current packs/day: 1.00    Average packs/day: 1 pack/day for 24.0 years (24.0 ttl pk-yrs)    Types: Cigarettes   Smokeless tobacco: Never   Tobacco comments:    Previously smoked 1ppd.  Cutting back and down from 5 cigs / day to 4 cigs per day in 4 weeks.  Vaping Use   Vaping status: Never Used  Substance and Sexual Activity   Alcohol use: Yes    Alcohol/week: 2.0 standard drinks of alcohol    Types: 2 Cans of beer per week    Comment: BEER OCC   Drug use: No   Sexual activity: Not Currently  Other Topics Concern   Not on file  Social History Narrative   Not on file   Social Drivers of Health   Financial Resource Strain: Low Risk  (03/17/2023)   Overall Financial Resource Strain (CARDIA)    Difficulty of Paying Living Expenses: Not hard at all  Food Insecurity: No Food Insecurity (03/17/2023)   Hunger Vital Sign    Worried About Running Out of Food in the Last Year: Never true    Ran Out of Food in the Last Year: Never true  Transportation Needs: No Transportation Needs (03/17/2023)   PRAPARE - Administrator, Civil Service (Medical): No    Lack of Transportation (Non-Medical): No  Physical Activity: Sufficiently Active (03/17/2023)   Exercise Vital Sign    Days of Exercise per Week: 7 days    Minutes of Exercise per Session: 40 min  Stress: No Stress Concern Present (03/17/2023)   [employer] of Occupational Health - Occupational Stress Questionnaire    Feeling of Stress : Not at all  Social Connections: Moderately Isolated (03/17/2023)   Social Connection and Isolation Panel    Frequency of Communication with Friends and Family: Twice a week    Frequency of Social Gatherings with Friends and Family: Never    Attends Religious Services: More than 4 times per year    Active Member of [employer] or Organizations: No    Attends Banker Meetings: Never    Marital  Status: Living with partner  Intimate Partner Violence: Not At Risk (03/17/2023)   Humiliation, Afraid, Rape, and Kick questionnaire    Fear of Current or Ex-Partner: No    Emotionally Abused: No    Physically Abused: No    Sexually Abused: No  Constitutional: Denies fever, malaise, fatigue, headache or abrupt weight changes.  HEENT: Denies eye pain, eye redness, ear pain, ringing in the ears, wax buildup, runny nose, nasal congestion, bloody nose, or sore throat. Respiratory: Denies difficulty breathing, shortness of breath, cough or sputum production.   Cardiovascular: Denies chest pain, chest tightness, palpitations or swelling in the hands or feet.  Gastrointestinal: Denies abdominal pain, bloating, constipation, diarrhea or blood in the stool.  GU: Patient reports urinary frequency.  Denies urgency, frequency, pain with urination, burning sensation, blood in urine, odor or discharge. Musculoskeletal: Denies decrease in range of motion, difficulty with gait, muscle pain or joint pain orswelling.  Skin: Denies redness, rashes, lesions or ulcercations.  Neurological: Patient reports insomnia.  Denies dizziness, difficulty with memory, difficulty with speech or problems with balance and coordination.  Psych: Denies anxiety, depression, SI/HI.  No other specific complaints in a complete review of systems (except as listed in HPI above).  Objective:   Physical Exam  BP 108/72 (BP Location: Left Arm, Patient Position: Sitting, Cuff Size: Normal)   Pulse 92   Ht 5' 7 (1.702 m)   Wt 205 lb 3.2 oz (93.1 kg)   SpO2 98%   BMI 32.14 kg/m     Wt Readings from Last 3 Encounters:  10/26/23 197 lb (89.4 kg)  10/19/23 203 lb 2 oz (92.1 kg)  04/11/23 202 lb 3.2 oz (91.7 kg)    General: Appears his stated age, obese, in NAD. Skin: Warm, dry and intact. No  ulcerations noted. HEENT: Head: normal shape and size; Eyes: sclera white, no icterus, conjunctiva pink, PERRLA and EOMs intact;   Cardiovascular: Normal rate and rhythm. S1,S2 noted.  Murmur noted. No JVD or BLE edema. No carotid bruits noted. Pulmonary/Chest: Normal effort and diminished breath sounds. No respiratory distress. No wheezes, rales or ronchi noted.  Abdomen: Normal bowel sounds.  Musculoskeletal: No difficulty with gait.  Neurological: Alert and oriented. Coordination normal.  Psychiatric: Mood and affect normal. Behavior is normal. Judgment and thought content normal.    BMET    Component Value Date/Time   NA 140 10/26/2023 1047   NA 140 03/25/2015 0802   NA 137 05/19/2013 0254   K 4.3 10/26/2023 1047   K 3.9 05/19/2013 0254   CL 104 10/26/2023 1047   CL 107 05/19/2013 0254   CO2 26 10/26/2023 1047   CO2 25 05/19/2013 0254   GLUCOSE 178 (H) 10/26/2023 1047   GLUCOSE 124 (H) 05/19/2013 0254   BUN 14 10/26/2023 1047   BUN 15 03/25/2015 0802   BUN 14 05/19/2013 0254   CREATININE 0.74 10/26/2023 1047   CALCIUM  9.0 10/26/2023 1047   CALCIUM  8.5 05/19/2013 0254   GFRNONAA >60 11/01/2022 1151   GFRNONAA 96 11/03/2017 0821   GFRAA >60 12/09/2018 0500   GFRAA 111 11/03/2017 0821    Lipid Panel     Component Value Date/Time   CHOL 336 (H) 10/26/2023 1047   CHOL 243 (H) 03/25/2015 0802   TRIG 201 (H) 10/26/2023 1047   HDL 62 10/26/2023 1047   HDL 38 (L) 03/25/2015 0802   CHOLHDL 5.4 (H) 10/26/2023 1047   VLDL 48 (H) 10/01/2016 1140   LDLCALC 235 (H) 10/26/2023 1047    CBC    Component Value Date/Time   WBC 8.7 10/26/2023 1047   RBC 6.06 (H) 10/26/2023 1047   HGB 18.5 (H) 10/26/2023 1047   HGB 16.0 03/25/2015 0802   HCT 55.7 (H) 10/26/2023 1047   HCT 45.1  03/25/2015 0802   PLT 218 10/26/2023 1047   PLT 190 03/25/2015 0802   MCV 91.9 10/26/2023 1047   MCV 90 03/25/2015 0802   MCV 89 05/19/2013 0254   MCH 30.5 10/26/2023 1047   MCHC 33.2 10/26/2023 1047   RDW 13.0 10/26/2023 1047   RDW 13.4 03/25/2015 0802   RDW 13.2 05/19/2013 0254   LYMPHSABS 2.2 12/09/2018 0500    LYMPHSABS 1.6 03/25/2015 0802   LYMPHSABS 1.4 05/19/2013 0254   MONOABS 0.8 12/09/2018 0500   MONOABS 1.2 (H) 05/19/2013 0254   EOSABS 0.1 12/09/2018 0500   EOSABS 0.2 03/25/2015 0802   EOSABS 0.1 05/19/2013 0254   BASOSABS 0.0 12/09/2018 0500   BASOSABS 0.0 03/25/2015 0802   BASOSABS 0.1 05/19/2013 0254    Hgb A1C Lab Results  Component Value Date   HGBA1C 8.9 (H) 10/26/2023           Assessment & Plan:     RTC in 3 months, follow-up chronic conditions Angeline Laura, NP

## 2024-01-24 NOTE — Patient Instructions (Signed)
Thoracic Aortic Aneurysm  An aneurysm is a bulge in an artery. It happens when blood pushes against a weak or damaged artery wall. A thoracic aortic aneurysm (TAA) happens in the upper part of the aorta, between the heart and the diaphragm. The aorta is the largest artery of the body. It supplies blood from the heart to the rest of the body. Some aneurysms may not cause symptoms. But a TAA can cause two serious problems: It can grow and then burst (rupture). It can cause blood to flow between the layers of the wall of the aorta through a tear (aortic dissection). These problems are medical emergencies. They can cause internal bleeding and should be treated right away. What are the causes? A TAA may be caused by: Cystic medial degeneration. This is when the fibers of the wall of the aorta become weak and expand. A genetic disease that weakens body tissue. This includes Marfan syndrome. In some cases, the cause is not known. What increases the risk? You may be more likely to have an aneurysm if: You are 53 years of age or older. You have a family history of aneurysms. You use or have used nicotine or tobacco products. You have: Arteriosclerosis. This is when your arteries harden. Arteritis. This is inflammation of the walls of an artery. An injury or trauma to your aorta. High blood pressure (hypertension). High cholesterol. Bicuspid aortic valve. This is when only two flaps (valves) over your aorta are working instead of three. This can make it harder for your heart to pump blood. What are the signs or symptoms? Symptoms depend on how big the aneurysm is and how fast it is growing. Most grow slowly and do not cause symptoms. In some cases, you may have: Pain in your chest, back, sides, or abdomen. If the pain is sudden and severe, it may mean that the aneurysm has ruptured. A cough or hoarseness. Shortness of breath. Trouble swallowing. Swelling in your face, arms, or  legs. Fainting. How is this diagnosed? This condition may be diagnosed based on: Ultrasound. X-rays. CT scan. MRI. Lab tests. Angiogram. This test checks your arteries for damage or blockage. Many aneurysms are found during exams for other conditions. How is this treated? Treatment depends on: The size of the aneurysm. How fast it is growing. Your age. Risk factors for rupture. If your aneurysm is smaller than 2.2 inches (5.5 cm), you may need: Medicines to: Control blood pressure. Treat pain. Fight infection. Control cholesterol. An ultrasound or CT scan every 6 months or year to see if the aneurysm is getting bigger. If your aneurysm is larger or growing fast, you may need surgery. Follow these instructions at home: Eating and drinking  Eat a healthy diet. You may need to: Take in less salt (sodium). Too much salt can raise your blood pressure and increase your risk for a TAA. Avoid foods that are high in saturated fat and cholesterol. These include red meat and some dairy products. Eat less sugar. Take in more fiber. You may need to eat more whole grains, vegetables, and fruit. Lifestyle Do not use any products that contain nicotine or tobacco. These products include cigarettes, chewing tobacco, and vaping devices, such as e-cigarettes. If you need help quitting, ask your health care provider. Check your blood pressure often. Follow instructions on how to keep it within normal limits. Have your blood sugar (glucose) level and cholesterol levels checked often. Exercise on a regular basis. Talk with your provider about how often  to exercise and which types of exercise are best for you. Maintain a healthy weight. Alcohol use Do not drink alcohol if: Your provider tells you not to drink. You are pregnant, may be pregnant, or plan to become pregnant. If you drink alcohol: Limit how much you have to: 0-1 drink a day if you are male. 0-2 drinks if you are male. Know how  much alcohol is in your drink. In the [place] one drink is one 12 oz bottle of beer (355 mL), one 5 oz glass of wine (148 mL), or one 1 oz glass of hard liquor (44 mL). General instructions Take over-the-counter and prescription medicines only as told by your provider. You may have to avoid lifting. Ask your provider how much you can safely lift. Keep all follow-up visits. Your provider will need to watch the size of your aneurysm and how fast it is growing. Contact a health care provider if: You lose weight and do not know why. You have trouble swallowing. You have a cough or hoarseness. Get help right away if: You have sudden, severe pain in your abdomen, side, or back. It may move into your chest and arms. You are short of breath. You feel light-headed, or you faint. You have any symptoms of a stroke. "BE FAST" is an easy way to remember the main warning signs of a stroke: B - Balance. Signs are dizziness, sudden trouble walking, or loss of balance. E - Eyes. Signs are trouble seeing or a sudden change in vision. F - Face. Signs are sudden weakness or numbness of the face, or the face or eyelid drooping on one side. A - Arms. Signs are weakness or numbness in an arm. This happens suddenly and usually on one side of the body. S - Speech. Signs are sudden trouble speaking, slurred speech, or trouble understanding what people say. T - Time. Time to call emergency services. Write down what time symptoms started. You have other signs of a stroke, such as: A sudden, severe headache with no known cause. Nausea or vomiting. Seizure. These symptoms may be an emergency. Get help right away. Call 911. Do not wait to see if the symptoms will go away. Do not drive yourself to the hospital. This information is not intended to replace advice given to you by your health care provider. Make sure you discuss any questions you have with your health care provider. Document Revised: 12/10/2021 Document  Reviewed: 12/10/2021 Elsevier Patient Education  2024 [employer].

## 2024-01-24 NOTE — Assessment & Plan Note (Signed)
 CBC today Encouraged smoking cessation

## 2024-01-24 NOTE — Assessment & Plan Note (Signed)
 Encourage diet and exercise for weight loss

## 2024-01-24 NOTE — Assessment & Plan Note (Signed)
 Controlled on lisinopril  5 mg daily Reinforced DASH diet and exercise for weight loss C-Met today

## 2024-01-24 NOTE — Assessment & Plan Note (Signed)
 C-Met and lipid profile today Encouraged him to continue rosuvastatin  40 mg and ezetimibe  10 mg daily Encouraged low-fat diet

## 2024-01-24 NOTE — Assessment & Plan Note (Signed)
 C-Met and lipid profile today Continue rosuvastatin  40 mg, ezetimibe   10 mg daily Encouraged him to take asa 81 mg daily Encouraged low-fat diet

## 2024-01-24 NOTE — Assessment & Plan Note (Signed)
 Will increase trazodone  to 100 mg at bedtime We will monitor

## 2024-01-24 NOTE — Assessment & Plan Note (Signed)
 Not taking tamsulosin  0.4 mg, will discontinue to nonuse Will monitor symptoms

## 2024-01-24 NOTE — Assessment & Plan Note (Signed)
CTA chest ordered.

## 2024-01-24 NOTE — Assessment & Plan Note (Signed)
 Currently a not an issue per his report

## 2024-01-24 NOTE — Assessment & Plan Note (Signed)
 Encourage smoking cessation Not using any inhalers

## 2024-01-24 NOTE — Assessment & Plan Note (Signed)
 A1c and urine microalbumin today Continue  lantus  10 units daily and ozempic  0.25 mg weekly  Encourage low-carb diet and exercise for weight loss Encouraged routine eye exam Encouraged routine foot exam

## 2024-01-27 ENCOUNTER — Other Ambulatory Visit

## 2024-01-27 DIAGNOSIS — Z794 Long term (current) use of insulin: Secondary | ICD-10-CM

## 2024-01-28 LAB — COMPREHENSIVE METABOLIC PANEL WITH GFR
AG Ratio: 1.6 (calc) (ref 1.0–2.5)
ALT: 19 U/L (ref 9–46)
AST: 17 U/L (ref 10–35)
Albumin: 4.1 g/dL (ref 3.6–5.1)
Alkaline phosphatase (APISO): 76 U/L (ref 35–144)
BUN: 13 mg/dL (ref 7–25)
CO2: 26 mmol/L (ref 20–32)
Calcium: 8.7 mg/dL (ref 8.6–10.3)
Chloride: 104 mmol/L (ref 98–110)
Creat: 0.76 mg/dL (ref 0.70–1.35)
Globulin: 2.6 g/dL (ref 1.9–3.7)
Glucose, Bld: 227 mg/dL — ABNORMAL HIGH (ref 65–99)
Potassium: 4.3 mmol/L (ref 3.5–5.3)
Sodium: 138 mmol/L (ref 135–146)
Total Bilirubin: 0.8 mg/dL (ref 0.2–1.2)
Total Protein: 6.7 g/dL (ref 6.1–8.1)
eGFR: 97 mL/min/1.73m2 (ref >=60)

## 2024-01-28 LAB — CBC
HCT: 51.1 % — ABNORMAL HIGH (ref 38.5–50.0)
Hemoglobin: 17.4 g/dL — ABNORMAL HIGH (ref 13.2–17.1)
MCH: 31.8 pg (ref 27.0–33.0)
MCHC: 34.1 g/dL (ref 32.0–36.0)
MCV: 93.4 fL (ref 80.0–100.0)
MPV: 10 fL (ref 7.5–12.5)
Platelets: 205 Thousand/uL (ref 140–400)
RBC: 5.47 Million/uL (ref 4.20–5.80)
RDW: 12.3 % (ref 11.0–15.0)
WBC: 6.3 Thousand/uL (ref 3.8–10.8)

## 2024-01-28 LAB — MICROALBUMIN / CREATININE URINE RATIO
Creatinine, Urine: 46 mg/dL (ref 20–320)
Microalb Creat Ratio: 17 mg/g{creat} (ref <30)
Microalb, Ur: 0.8 mg/dL

## 2024-01-28 LAB — LIPID PANEL
Cholesterol: 284 mg/dL — ABNORMAL HIGH (ref <200)
HDL: 43 mg/dL (ref >=40)
LDL Cholesterol (Calc): 200 mg/dL — ABNORMAL HIGH
Non-HDL Cholesterol (Calc): 241 mg/dL — ABNORMAL HIGH (ref <130)
Total CHOL/HDL Ratio: 6.6 (calc) — ABNORMAL HIGH (ref <5.0)
Triglycerides: 224 mg/dL — ABNORMAL HIGH (ref <150)

## 2024-01-28 LAB — HEMOGLOBIN A1C
Hgb A1c MFr Bld: 7.9 % — ABNORMAL HIGH (ref <5.7)
Mean Plasma Glucose: 180 mg/dL
eAG (mmol/L): 10 mmol/L

## 2024-01-30 ENCOUNTER — Ambulatory Visit: Payer: Self-pay | Admitting: Internal Medicine

## 2024-01-30 DIAGNOSIS — I700 Atherosclerosis of aorta: Secondary | ICD-10-CM

## 2024-01-30 DIAGNOSIS — E1165 Type 2 diabetes mellitus with hyperglycemia: Secondary | ICD-10-CM

## 2024-01-30 DIAGNOSIS — E1169 Type 2 diabetes mellitus with other specified complication: Secondary | ICD-10-CM

## 2024-01-30 MED ORDER — REPATHA SURECLICK 140 MG/ML ~~LOC~~ SOAJ: 140.0000 mg | SUBCUTANEOUS | 2 refills | Status: DC

## 2024-01-30 NOTE — Telephone Encounter (Signed)
 Repatha  ordered, will await prior auth

## 2024-01-31 ENCOUNTER — Ambulatory Visit: Admitting: Internal Medicine

## 2024-02-01 ENCOUNTER — Ambulatory Visit
Admission: RE | Admit: 2024-02-01 | Discharge: 2024-02-01 | Disposition: A | Discharge status: Home / Self Care | Source: Ambulatory Visit | Attending: Internal Medicine | Admitting: Internal Medicine

## 2024-02-01 ENCOUNTER — Ambulatory Visit: Payer: Self-pay | Admitting: Internal Medicine

## 2024-02-01 ENCOUNTER — Other Ambulatory Visit: Payer: Self-pay | Admitting: Internal Medicine

## 2024-02-01 DIAGNOSIS — D751 Secondary polycythemia: Secondary | ICD-10-CM

## 2024-02-01 DIAGNOSIS — N400 Enlarged prostate without lower urinary tract symptoms: Secondary | ICD-10-CM

## 2024-02-01 DIAGNOSIS — E66811 Obesity, class 1: Secondary | ICD-10-CM

## 2024-02-01 DIAGNOSIS — Z7985 Long-term (current) use of injectable non-insulin antidiabetic drugs: Secondary | ICD-10-CM

## 2024-02-01 DIAGNOSIS — I712 Thoracic aortic aneurysm, without rupture, unspecified: Secondary | ICD-10-CM

## 2024-02-01 DIAGNOSIS — M150 Primary generalized (osteo)arthritis: Secondary | ICD-10-CM

## 2024-02-01 DIAGNOSIS — I700 Atherosclerosis of aorta: Secondary | ICD-10-CM

## 2024-02-01 DIAGNOSIS — E1169 Type 2 diabetes mellitus with other specified complication: Secondary | ICD-10-CM

## 2024-02-01 DIAGNOSIS — Z794 Long term (current) use of insulin: Secondary | ICD-10-CM

## 2024-02-01 DIAGNOSIS — E1165 Type 2 diabetes mellitus with hyperglycemia: Secondary | ICD-10-CM

## 2024-02-01 DIAGNOSIS — G4709 Other insomnia: Secondary | ICD-10-CM

## 2024-02-01 DIAGNOSIS — J432 Centrilobular emphysema: Secondary | ICD-10-CM

## 2024-02-01 DIAGNOSIS — I10 Essential (primary) hypertension: Secondary | ICD-10-CM

## 2024-02-01 MED ORDER — IOHEXOL 350 MG/ML SOLN
75.0000 mL | Freq: Once | INTRAVENOUS | Status: AC | PRN
  Administered 2024-02-01: 75 mL via INTRAVENOUS

## 2024-02-03 ENCOUNTER — Telehealth: Payer: Self-pay

## 2024-02-03 ENCOUNTER — Other Ambulatory Visit (HOSPITAL_COMMUNITY): Payer: Self-pay

## 2024-02-03 NOTE — Telephone Encounter (Signed)
 Pharmacy Patient Advocate Encounter  Received notification from [facility] that Prior Authorization for Repatha  SureClick 140MG /ML auto-injectors  has been APPROVED from 02/03/24 to 08/02/24. Ran test claim, Copay is $47. This test claim was processed through [facility]- copay amounts may vary at other pharmacies due to pharmacy/plan contracts, or as the patient moves through the different stages of their insurance plan.   PA #/Case ID/Reference #: [id number]

## 2024-03-23 ENCOUNTER — Ambulatory Visit (INDEPENDENT_AMBULATORY_CARE_PROVIDER_SITE_OTHER): Payer: Medicare Other

## 2024-03-23 DIAGNOSIS — Z0000 Encounter for general adult medical examination without abnormal findings: Secondary | ICD-10-CM

## 2024-03-23 NOTE — Progress Notes (Signed)
 Subjective:   Craig Bruce is a 68 y.o. who presents for a Medicare Wellness preventive visit.  As a reminder, Annual Wellness Visits don't include a physical exam, and some assessments may be limited, especially if this visit is performed virtually. We may recommend an in-person follow-up visit with your provider if needed.  Visit Complete: Virtual I connected with  Craig Bruce on 03/23/24 by a audio enabled telemedicine application and verified that I am speaking with the correct person using two identifiers.  Patient Location: Home  Provider Location: Office/Clinic  I discussed the limitations of evaluation and management by telemedicine. The patient expressed understanding and agreed to proceed.  Vital Signs: Because this visit was a virtual/telehealth visit, some criteria may be missing or patient reported. Any vitals not documented were not able to be obtained and vitals that have been documented are patient reported.  VideoDeclined- This patient declined Librarian, academic. Therefore the visit was completed with audio only.  Persons Participating in Visit: Patient.  AWV Questionnaire: No: Patient Medicare AWV questionnaire was not completed prior to this visit.  Cardiac Risk Factors include: advanced age (>23men, >73 women);diabetes mellitus;dyslipidemia;hypertension;male gender;obesity (BMI >30kg/m2);smoking/ tobacco exposure     Objective:    There were no vitals filed for this visit. There is no height or weight on file to calculate BMI.     03/23/2024    9:49 AM 03/17/2023    1:12 PM 11/01/2022   11:48 AM 09/11/2020    8:45 AM 06/29/2019    1:43 AM 02/26/2018    7:35 PM 11/23/2017   10:01 AM  Advanced Directives  Does Patient Have a Medical Advance Directive? No No No;Yes No No No  No   Type of Advance Directive   Living will      Would patient like information on creating a medical advance directive? No - Patient declined No -  Patient declined  No - Patient declined No - Patient declined  No - Patient declined      Data saved with a previous flowsheet row definition    Current Medications (verified) Outpatient Encounter Medications as of 03/23/2024  Medication Sig   Evolocumab  (REPATHA  SURECLICK) 140 MG/ML SOAJ Inject 140 mg into the skin every 14 (fourteen) days.   insulin  glargine (LANTUS  SOLOSTAR) 100 UNIT/ML Solostar Pen Inject 10 Units into the skin at bedtime.   Insulin  Pen Needle 31G X 5 MM MISC BD Pen Needles- brand specific Inject insulin  via insulin  pen 6 x daily   lisinopril  (ZESTRIL ) 5 MG tablet Take 1 tablet (5 mg total) by mouth daily.   OZEMPIC , 0.25 OR 0.5 MG/DOSE, 2 MG/3ML SOPN Inject 0.5 mg into the skin once a week. Start with 0.25mg  weekly x 4 weeks then increase to 0.5mg  weekly injection.   rosuvastatin  (CRESTOR ) 40 MG tablet TAKE 1 TABLET BY MOUTH EVERY DAY   tamsulosin  (FLOMAX ) 0.4 MG CAPS capsule Take 1 capsule (0.4 mg total) by mouth daily after breakfast.   traZODone  (DESYREL ) 50 MG tablet Take 1-2 tablets (50-100 mg total) by mouth at bedtime as needed.   aspirin  81 MG EC tablet Take 1 tablet (81 mg total) by mouth daily. Swallow whole. (Patient not taking: Reported on 03/23/2024)   ezetimibe  (ZETIA ) 10 MG tablet Take 1 tablet (10 mg total) by mouth daily.   fluticasone  (FLONASE ) 50 MCG/ACT nasal spray Place 2 sprays into both nostrils daily. Use for 4-6 weeks then stop and use seasonally or as needed. (Patient  not taking: Reported on 03/23/2024)   No facility-administered encounter medications on file as of 03/23/2024.    Allergies (verified) Other   History: Past Medical History:  Diagnosis Date   Annual physical exam 03/24/2015   Arthritis    BOTH HANDS   Chronic kidney disease    H/O KIDNEY STONES   DVT (deep venous thrombosis) (HCC) 1996   Gastritis    Immunization due 03/24/2015   Need for influenza vaccination 03/24/2015   Obesity (BMI 30.0-34.9) 02/17/2015   Smoker  02/17/2015   Past Surgical History:  Procedure Laterality Date   COLONOSCOPY WITH PROPOFOL  N/A 09/11/2020   Procedure: COLONOSCOPY WITH PROPOFOL ;  Surgeon: Therisa Bi, MD;  [facility];  Service: Gastroenterology;  Laterality: N/A;   ORCHIECTOMY Right 06/25/2015   Procedure: ORCHIECTOMY;  Surgeon: Redell Lynwood Napoleon, MD;  Location: [facility] ORS;  Service: Urology;  Laterality: Right;   WISDOM TOOTH EXTRACTION     Family History  Problem Relation Age of Onset   Alcohol abuse Mother    Alcohol abuse Father    Prostate cancer Neg Hx    Kidney cancer Neg Hx    Kidney failure Neg Hx    Colon cancer Neg Hx    Social History   Socioeconomic History   Marital status: Significant Other    Spouse name: Not on file   Number of children: 1   Years of education: Not on file   Highest education level: Not on file  Occupational History   Occupation: retired    Comment: Retired 2022  Tobacco Use   Smoking status: Every Day    Current packs/day: 1.00    Average packs/day: 1 pack/day for 24.0 years (24.0 ttl pk-yrs)    Types: Cigarettes   Smokeless tobacco: Never   Tobacco comments:    Previously smoked 1ppd.  Cutting back and down from 5 cigs / day to 4 cigs per day in 4 weeks.  Vaping Use   Vaping status: Never Used  Substance and Sexual Activity   Alcohol use: Yes    Alcohol/week: 2.0 standard drinks of alcohol    Types: 2 Cans of beer per week    Comment: BEER OCC   Drug use: No   Sexual activity: Not Currently  Other Topics Concern   Not on file  Social History Narrative   Not on file   Social Drivers of Health   Financial Resource Strain: Low Risk  (03/23/2024)   Overall Financial Resource Strain (CARDIA)    Difficulty of Paying Living Expenses: Not hard at all  Food Insecurity: No Food Insecurity (03/23/2024)   Hunger Vital Sign    Worried About Running Out of Food in the Last Year: Never true    Ran Out of Food in the Last Year: Never true  Transportation  Needs: No Transportation Needs (03/23/2024)   PRAPARE - Administrator, Civil Service (Medical): No    Lack of Transportation (Non-Medical): No  Physical Activity: Sufficiently Active (03/23/2024)   Exercise Vital Sign    Days of Exercise per Week: 7 days    Minutes of Exercise per Session: 40 min  Stress: No Stress Concern Present (03/23/2024)   [employer] of Occupational Health - Occupational Stress Questionnaire    Feeling of Stress: Not at all  Social Connections: Socially Isolated (03/23/2024)   Social Connection and Isolation Panel    Frequency of Communication with Friends and Family: Once a week    Frequency of Social Gatherings  with Friends and Family: Never    Attends Religious Services: Never    Database Administrator or Organizations: No    Attends Engineer, Structural: Never    Marital Status: Living with partner    Tobacco Counseling Ready to quit: Not Answered Counseling given: Not Answered Tobacco comments: Previously smoked 1ppd.  Cutting back and down from 5 cigs / day to 4 cigs per day in 4 weeks.    Clinical Intake:  Pre-visit preparation completed: Yes  Pain : No/denies pain     BMI - recorded: 32.1 Nutritional Status: BMI > 30  Obese Nutritional Risks: None Diabetes: Yes CBG done?: No Did pt. bring in CBG monitor from home?: No  Lab Results  Component Value Date   HGBA1C 7.9 (H) 01/27/2024   HGBA1C 8.9 (H) 10/26/2023   HGBA1C 12.0 (H) 04/11/2023     How often do you need to have someone help you when you read instructions, pamphlets, or other written materials from your doctor or pharmacy?: 1 - Never  Interpreter Needed?: No  Information entered by :: Craig DAS, LPN   Activities of Daily Living     03/23/2024    9:51 AM  In your present state of health, do you have any difficulty performing the following activities:  Hearing? 0  Vision? 0  Difficulty concentrating or making decisions? 0  Walking or  climbing stairs? 0  Dressing or bathing? 0  Doing errands, shopping? 0  Preparing Food and eating ? N  Using the Toilet? N  In the past six months, have you accidently leaked urine? N  Do you have problems with loss of bowel control? N  Managing your Medications? N  Managing your Finances? N  Housekeeping or managing your Housekeeping? N    Patient Care Team: Antonette Angeline ORN, NP as PCP - General (Internal Medicine) Mevelyn JONETTA Bathe, OD (Optometry)  I have updated your Care Teams any recent Medical Services you may have received from other providers in the past year.     Assessment:   This is a routine wellness examination for Craig Bruce.  Hearing/Vision screen Hearing Screening - Comments:: NO AIDS Vision Screening - Comments:: WEARS GLASSES ALL DAY-    Goals Addressed             This Visit's Progress    DIET - INCREASE WATER INTAKE         Depression Screen     03/23/2024    9:47 AM 01/24/2024    9:42 AM 10/26/2023   10:34 AM 10/19/2023   10:07 AM 04/11/2023    9:02 AM 03/17/2023    1:10 PM 01/07/2023    8:46 AM  PHQ 2/9 Scores  PHQ - 2 Score 0 0 2 2 0 0 1  PHQ- 9 Score 0 0 17 17  0     Fall Risk     03/23/2024    9:51 AM 01/24/2024    9:42 AM 10/26/2023   10:32 AM 10/19/2023   10:06 AM 04/11/2023    9:02 AM  Fall Risk   Bruce in the past year? 0 0 0 0 0  Number Bruce in past yr: 0  1 1 1   Injury with Fall? 0  0 0 0  Risk for fall due to : No Fall Risks      Follow up Bruce evaluation completed;Bruce prevention discussed        MEDICARE RISK AT HOME:  Medicare Risk at Home Any  stairs in or around the home?: Yes If so, are there any without handrails?: No Home free of loose throw rugs in walkways, pet beds, electrical cords, etc?: Yes Adequate lighting in your home to reduce risk of Bruce?: Yes Life alert?: No Use of a cane, walker or w/c?: No Grab bars in the bathroom?: No Shower chair or bench in shower?: No Elevated toilet seat or a handicapped toilet?:  No  TIMED UP AND GO:  Was the test performed?  No  Cognitive Function: 6CIT completed        03/23/2024    9:53 AM 03/17/2023    1:14 PM 07/06/2021   11:43 AM  6CIT Screen  What Year? 0 points 0 points 0 points  What month? 0 points 0 points 0 points  What time? 0 points 0 points 0 points  Count back from 20 4 points 0 points 0 points  Months in reverse 4 points 0 points 0 points  Repeat phrase 10 points 10 points 0 points  Total Score 18 points 10 points 0 points    Immunizations Immunization History  Administered Date(s) Administered   Hep A / Hep B 01/23/2024   INFLUENZA, HIGH DOSE SEASONAL PF 01/11/2021   Influenza,inj,Quad PF,6+ Mos 03/24/2015, 04/11/2017, 01/13/2019   Influenza-Unspecified 03/24/2015, 01/20/2021, 04/23/2021, 01/21/2023, 01/23/2024   PFIZER(Purple Top)SARS-COV-2 Vaccination 08/24/2019, 09/14/2019   PNEUMOCOCCAL CONJUGATE-20 10/26/2023   Tdap 03/24/2015   Unspecified SARS-COV-2 Vaccination 01/21/2023    Screening Tests Health Maintenance  Topic Date Due   Zoster Vaccines- Shingrix (1 of 2) Never done   OPHTHALMOLOGY EXAM  05/05/2023   Colonoscopy  09/12/2023   HEMOGLOBIN A1C  07/26/2024   FOOT EXAM  10/25/2024   Diabetic kidney evaluation - eGFR measurement  01/26/2025   Diabetic kidney evaluation - Urine ACR  01/26/2025   Lung Cancer Screening  01/31/2025   DTaP/Tdap/Td (2 - Td or Tdap) 03/23/2025   Medicare Annual Wellness (AWV)  03/23/2025   Pneumococcal Vaccine: 50+ Years  Completed   Influenza Vaccine  Completed   Hepatitis C Screening  Completed   Meningococcal B Vaccine  Aged Out   Hepatitis B Vaccines 19-59 Average Risk  Discontinued   COVID-19 Vaccine  Discontinued   Fecal DNA (Cologuard)  Discontinued    Health Maintenance Items Addressed: NEEDS SHINGRIX; DECLINES COLONOSCOPY REFERRAL; UTD ON LUNG CA SCREENING  Additional Screening:  Vision Screening: Recommended annual ophthalmology exams for early detection of glaucoma  and other disorders of the eye. Is the patient up to date with their annual eye exam?  No  Who is the provider or what is the name of the office in which the patient attends annual eye exams? NO ONE  Dental Screening: Recommended annual dental exams for proper oral hygiene  Community Resource Referral / Chronic Care Management: CRR required this visit?  No   CCM required this visit?  No   Plan:    I have personally reviewed and noted the following in the patient's chart:   Medical and social history Use of alcohol, tobacco or illicit drugs  Current medications and supplements including opioid prescriptions. Patient is not currently taking opioid prescriptions. Functional ability and status Nutritional status Physical activity Advanced directives List of other physicians Hospitalizations, surgeries, and ER visits in previous 12 months Vitals Screenings to include cognitive, depression, and Bruce Referrals and appointments  In addition, I have reviewed and discussed with patient certain preventive protocols, quality metrics, and best practice recommendations. A written personalized care plan for  preventive services as well as general preventive health recommendations were provided to patient.   Craig GORMAN Das, LPN   [id number]   After Visit Summary: (MyChart) Due to this being a telephonic visit, the after visit summary with patients personalized plan was offered to patient via MyChart   Notes: Nothing significant to report at this time.

## 2024-03-23 NOTE — Patient Instructions (Addendum)
 Mr. Craig Bruce,  Thank you for taking the time for your Medicare Wellness Visit. I appreciate your continued commitment to your health goals. Please review the care plan we discussed, and feel free to reach out if I can assist you further.  Medicare recommends these wellness visits once per year to help you and your care team stay ahead of potential health issues. These visits are designed to focus on prevention, allowing your provider to concentrate on managing your acute and chronic conditions during your regular appointments.  Please note that Annual Wellness Visits do not include a physical exam. Some assessments may be limited, especially if the visit was conducted virtually. If needed, we may recommend a separate in-person follow-up with your provider.  Ongoing Care Seeing your primary care provider every 3 to 6 months helps us  monitor your health and provide consistent, personalized care.   Referrals If a referral was made during today's visit and you haven't received any updates within two weeks, please contact the referred provider directly to check on the status.  Recommended Screenings:  Health Maintenance  Topic Date Due   Zoster (Shingles) Vaccine (1 of 2) Never done   Eye exam for diabetics  05/05/2023   Colon Cancer Screening  09/12/2023   Hemoglobin A1C  07/26/2024   Complete foot exam   10/25/2024   Yearly kidney function blood test for diabetes  01/26/2025   Yearly kidney health urinalysis for diabetes  01/26/2025   Screening for Lung Cancer  01/31/2025   DTaP/Tdap/Td vaccine (2 - Td or Tdap) 03/23/2025   Medicare Annual Wellness Visit  03/23/2025   Pneumococcal Vaccine for age over 82  Completed   Flu Shot  Completed   Hepatitis C Screening  Completed   Meningitis B Vaccine  Aged Out   Hepatitis B Vaccine  Discontinued   COVID-19 Vaccine  Discontinued   Cologuard (Stool DNA test)  Discontinued     Advance Care Planning is important because it: Ensures you  receive medical care that aligns with your values, goals, and preferences. Provides guidance to your family and loved ones, reducing the emotional burden of decision-making during critical moments.  Vision: Annual vision screenings are recommended for early detection of glaucoma, cataracts, and diabetic retinopathy. These exams can also reveal signs of chronic conditions such as diabetes and high blood pressure.  Dental: Annual dental screenings help detect early signs of oral cancer, gum disease, and other conditions linked to overall health, including heart disease and diabetes.  Please see the attached documents for additional preventive care recommendations.   NEXT AWV 03/29/25 @ 8:50 AM IN PERSON

## 2024-04-30 ENCOUNTER — Ambulatory Visit: Admitting: Internal Medicine

## 2024-04-30 NOTE — Progress Notes (Deleted)
 Subjective:    Patient ID: Craig Bruce, male    DOB: 06/05/1955, 68 y.o.   MRN: [id number]  HPI  Patient presents to clinic today for 22-month follow-up of chronic conditions.  DM2: His last A1c was 7.9, 92025.  He is taking lantus  and semaglutide  as prescribed. He is on lisinopril  for renal protection.  He does not check his sugars.  He checks his feet routinely.  His last eye exam was 04/2022.  Flu 01/2024.  Pneumovax unsure.  Prevnar 10/2023.  COVID Pfizer x 2.  OA: He denies any joint pain at this time. He is not taking any medications for this. He does not follow with orthopedics.  Insomnia: He has difficulty staying asleep.  He is taking trazodone  but reports it does not keep him asleep.  There is no sleep study on file.  HLD with aortic atherosclerosis: His last LDL was 200, triglycerides 224, 01/2024.  He is taking Repatha , rosuvastatin  and ezetimibe  as prescribed.  He is taking aspirin  as well.  He tries to consume low-fat diet.  BPH: He reports urinary frequency.  He is not taking tamsulosin  as prescribed.  He does not follow with urology.  Thoracic aortic aneurysm: CT from 01/2024 reviewed.  He does not follow with vascular.  HTN: His BP today is 108/72.  He is taking lisinopril  as prescribed.  ECG from 10/2022 reviewed.  Emphysema: He denies chronic cough or shortness of breath.  He does continue to smoke.  He is not using any inhalers at this time.  CT lung cancer screening from 01/2024 reviewed.  Polycythemia: His last H/H was 17.4/51.1, 01/2024.  He does smoke.  He does not follow with hematology.  Review of Systems     Past Medical History:  Diagnosis Date   Annual physical exam 03/24/2015   Arthritis    BOTH HANDS   Chronic kidney disease    H/O KIDNEY STONES   DVT (deep venous thrombosis) (HCC) 1996   Gastritis    Immunization due 03/24/2015   Need for influenza vaccination 03/24/2015   Obesity (BMI 30.0-34.9) 02/17/2015   Smoker 02/17/2015    Current  Outpatient Medications  Medication Sig Dispense Refill   aspirin  81 MG EC tablet Take 1 tablet (81 mg total) by mouth daily. Swallow whole. (Patient not taking: Reported on 03/23/2024) 30 tablet 12   Evolocumab  (REPATHA  SURECLICK) 140 MG/ML SOAJ Inject 140 mg into the skin every 14 (fourteen) days. 2 mL 2   ezetimibe  (ZETIA ) 10 MG tablet Take 1 tablet (10 mg total) by mouth daily. 90 tablet 0   fluticasone  (FLONASE ) 50 MCG/ACT nasal spray Place 2 sprays into both nostrils daily. Use for 4-6 weeks then stop and use seasonally or as needed. (Patient not taking: Reported on 03/23/2024) 16 g 3   insulin  glargine (LANTUS  SOLOSTAR) 100 UNIT/ML Solostar Pen Inject 10 Units into the skin at bedtime. 15 mL 0   Insulin  Pen Needle 31G X 5 MM MISC BD Pen Needles- brand specific Inject insulin  via insulin  pen 6 x daily 90 each 1   lisinopril  (ZESTRIL ) 5 MG tablet Take 1 tablet (5 mg total) by mouth daily. 90 tablet 1   OZEMPIC , 0.25 OR 0.5 MG/DOSE, 2 MG/3ML SOPN Inject 0.5 mg into the skin once a week. Start with 0.25mg  weekly x 4 weeks then increase to 0.5mg  weekly injection. 9 mL 0   rosuvastatin  (CRESTOR ) 40 MG tablet TAKE 1 TABLET BY MOUTH EVERY DAY 90 tablet 1   tamsulosin  (  FLOMAX ) 0.4 MG CAPS capsule Take 1 capsule (0.4 mg total) by mouth daily after breakfast. 90 capsule 0   traZODone  (DESYREL ) 50 MG tablet Take 1-2 tablets (50-100 mg total) by mouth at bedtime as needed. 90 tablet 0   No current facility-administered medications for this visit.    Allergies  Allergen Reactions   Other     BLOOD THINNER USED 20 YEARS AGO-PT UNSURE EXACTLY OF NAME-CAUSED FEVERS OF 104 PER PT    Family History  Problem Relation Age of Onset   Alcohol abuse Mother    Alcohol abuse Father    Prostate cancer Neg Hx    Kidney cancer Neg Hx    Kidney failure Neg Hx    Colon cancer Neg Hx     Social History   Socioeconomic History   Marital status: Significant Other    Spouse name: Not on file   Number of  children: 1   Years of education: Not on file   Highest education level: Not on file  Occupational History   Occupation: retired    Comment: Retired 2022  Tobacco Use   Smoking status: Every Day    Current packs/day: 1.00    Average packs/day: 1 pack/day for 24.0 years (24.0 ttl pk-yrs)    Types: Cigarettes   Smokeless tobacco: Never   Tobacco comments:    Previously smoked 1ppd.  Cutting back and down from 5 cigs / day to 4 cigs per day in 4 weeks.  Vaping Use   Vaping status: Never Used  Substance and Sexual Activity   Alcohol use: Yes    Alcohol/week: 2.0 standard drinks of alcohol    Types: 2 Cans of beer per week    Comment: BEER OCC   Drug use: No   Sexual activity: Not Currently  Other Topics Concern   Not on file  Social History Narrative   Not on file   Social Drivers of Health   Financial Resource Strain: Low Risk  (03/23/2024)   Overall Financial Resource Strain (CARDIA)    Difficulty of Paying Living Expenses: Not hard at all  Food Insecurity: No Food Insecurity (03/23/2024)   Hunger Vital Sign    Worried About Running Out of Food in the Last Year: Never true    Ran Out of Food in the Last Year: Never true  Transportation Needs: No Transportation Needs (03/23/2024)   PRAPARE - Administrator, Civil Service (Medical): No    Lack of Transportation (Non-Medical): No  Physical Activity: Sufficiently Active (03/23/2024)   Exercise Vital Sign    Days of Exercise per Week: 7 days    Minutes of Exercise per Session: 40 min  Stress: No Stress Concern Present (03/23/2024)   [employer] of Occupational Health - Occupational Stress Questionnaire    Feeling of Stress: Not at all  Social Connections: Socially Isolated (03/23/2024)   Social Connection and Isolation Panel    Frequency of Communication with Friends and Family: Once a week    Frequency of Social Gatherings with Friends and Family: Never    Attends Religious Services: Never    Automotive Engineer or Organizations: No    Attends Banker Meetings: Never    Marital Status: Living with partner  Intimate Partner Violence: Not At Risk (03/23/2024)   Humiliation, Afraid, Rape, and Kick questionnaire    Fear of Current or Ex-Partner: No    Emotionally Abused: No    Physically Abused: No  Sexually Abused: No     Constitutional: Denies fever, malaise, fatigue, headache or abrupt weight changes.  HEENT: Denies eye pain, eye redness, ear pain, ringing in the ears, wax buildup, runny nose, nasal congestion, bloody nose, or sore throat. Respiratory: Denies difficulty breathing, shortness of breath, cough or sputum production.   Cardiovascular: Denies chest pain, chest tightness, palpitations or swelling in the hands or feet.  Gastrointestinal: Denies abdominal pain, bloating, constipation, diarrhea or blood in the stool.  GU: Patient reports urinary frequency.  Denies urgency, frequency, pain with urination, burning sensation, blood in urine, odor or discharge. Musculoskeletal: Denies decrease in range of motion, difficulty with gait, muscle pain or joint pain orswelling.  Skin: Denies redness, rashes, lesions or ulcercations.  Neurological: Patient reports insomnia.  Denies dizziness, difficulty with memory, difficulty with speech or problems with balance and coordination.  Psych: Denies anxiety, depression, SI/HI.  No other specific complaints in a complete review of systems (except as listed in HPI above).  Objective:   Physical Exam  There were no vitals taken for this visit.    Wt Readings from Last 3 Encounters:  01/24/24 205 lb 3.2 oz (93.1 kg)  10/26/23 197 lb (89.4 kg)  10/19/23 203 lb 2 oz (92.1 kg)    General: Appears his stated age, obese, in NAD. Skin: Warm, dry and intact. No  ulcerations noted. HEENT: Head: normal shape and size; Eyes: sclera white, no icterus, conjunctiva pink, PERRLA and EOMs intact;  Cardiovascular: Normal rate and  rhythm. S1,S2 noted.  Murmur noted. No JVD or BLE edema. No carotid bruits noted. Pulmonary/Chest: Normal effort and diminished breath sounds. No respiratory distress. No wheezes, rales or ronchi noted.  Abdomen: Normal bowel sounds.  Musculoskeletal: No difficulty with gait.  Neurological: Alert and oriented. Coordination normal.  Psychiatric: Mood and affect normal. Behavior is normal. Judgment and thought content normal.    BMET    Component Value Date/Time   NA 138 01/27/2024 0834   NA 140 03/25/2015 0802   NA 137 05/19/2013 0254   K 4.3 01/27/2024 0834   K 3.9 05/19/2013 0254   CL 104 01/27/2024 0834   CL 107 05/19/2013 0254   CO2 26 01/27/2024 0834   CO2 25 05/19/2013 0254   GLUCOSE 227 (H) 01/27/2024 0834   GLUCOSE 124 (H) 05/19/2013 0254   BUN 13 01/27/2024 0834   BUN 15 03/25/2015 0802   BUN 14 05/19/2013 0254   CREATININE 0.76 01/27/2024 0834   CALCIUM  8.7 01/27/2024 0834   CALCIUM  8.5 05/19/2013 0254   GFRNONAA >60 11/01/2022 1151   GFRNONAA 96 11/03/2017 0821   GFRAA >60 12/09/2018 0500   GFRAA 111 11/03/2017 0821    Lipid Panel     Component Value Date/Time   CHOL 284 (H) 01/27/2024 0834   CHOL 243 (H) 03/25/2015 0802   TRIG 224 (H) 01/27/2024 0834   HDL 43 01/27/2024 0834   HDL 38 (L) 03/25/2015 0802   CHOLHDL 6.6 (H) 01/27/2024 0834   VLDL 48 (H) 10/01/2016 1140   LDLCALC 200 (H) 01/27/2024 0834    CBC    Component Value Date/Time   WBC 6.3 01/27/2024 0834   RBC 5.47 01/27/2024 0834   HGB 17.4 (H) 01/27/2024 0834   HGB 16.0 03/25/2015 0802   HCT 51.1 (H) 01/27/2024 0834   HCT 45.1 03/25/2015 0802   PLT 205 01/27/2024 0834   PLT 190 03/25/2015 0802   MCV 93.4 01/27/2024 0834   MCV 90 03/25/2015 0802  MCV 89 05/19/2013 0254   MCH 31.8 01/27/2024 0834   MCHC 34.1 01/27/2024 0834   RDW 12.3 01/27/2024 0834   RDW 13.4 03/25/2015 0802   RDW 13.2 05/19/2013 0254   LYMPHSABS 2.2 12/09/2018 0500   LYMPHSABS 1.6 03/25/2015 0802   LYMPHSABS 1.4  05/19/2013 0254   MONOABS 0.8 12/09/2018 0500   MONOABS 1.2 (H) 05/19/2013 0254   EOSABS 0.1 12/09/2018 0500   EOSABS 0.2 03/25/2015 0802   EOSABS 0.1 05/19/2013 0254   BASOSABS 0.0 12/09/2018 0500   BASOSABS 0.0 03/25/2015 0802   BASOSABS 0.1 05/19/2013 0254    Hgb A1C Lab Results  Component Value Date   HGBA1C 7.9 (H) 01/27/2024           Assessment & Plan:     RTC in 6 months for your annual exam Angeline Laura, NP

## 2024-05-06 ENCOUNTER — Other Ambulatory Visit: Payer: Self-pay | Admitting: Internal Medicine

## 2024-05-07 ENCOUNTER — Ambulatory Visit: Admitting: Internal Medicine

## 2024-05-07 ENCOUNTER — Encounter: Payer: Self-pay | Admitting: Internal Medicine

## 2024-05-07 VITALS — BP 118/72 | Ht 67.0 in | Wt 208.0 lb

## 2024-05-07 DIAGNOSIS — Z7985 Long-term (current) use of injectable non-insulin antidiabetic drugs: Secondary | ICD-10-CM

## 2024-05-07 DIAGNOSIS — I712 Thoracic aortic aneurysm, without rupture, unspecified: Secondary | ICD-10-CM

## 2024-05-07 DIAGNOSIS — J439 Emphysema, unspecified: Secondary | ICD-10-CM | POA: Diagnosis not present

## 2024-05-07 DIAGNOSIS — E1169 Type 2 diabetes mellitus with other specified complication: Secondary | ICD-10-CM | POA: Diagnosis not present

## 2024-05-07 DIAGNOSIS — D751 Secondary polycythemia: Secondary | ICD-10-CM | POA: Diagnosis not present

## 2024-05-07 DIAGNOSIS — E1159 Type 2 diabetes mellitus with other circulatory complications: Secondary | ICD-10-CM

## 2024-05-07 DIAGNOSIS — E1165 Type 2 diabetes mellitus with hyperglycemia: Secondary | ICD-10-CM

## 2024-05-07 DIAGNOSIS — N400 Enlarged prostate without lower urinary tract symptoms: Secondary | ICD-10-CM

## 2024-05-07 DIAGNOSIS — E785 Hyperlipidemia, unspecified: Secondary | ICD-10-CM

## 2024-05-07 DIAGNOSIS — G4709 Other insomnia: Secondary | ICD-10-CM

## 2024-05-07 DIAGNOSIS — Z794 Long term (current) use of insulin: Secondary | ICD-10-CM | POA: Diagnosis not present

## 2024-05-07 DIAGNOSIS — I152 Hypertension secondary to endocrine disorders: Secondary | ICD-10-CM | POA: Diagnosis not present

## 2024-05-07 DIAGNOSIS — I700 Atherosclerosis of aorta: Secondary | ICD-10-CM

## 2024-05-07 DIAGNOSIS — E66811 Obesity, class 1: Secondary | ICD-10-CM

## 2024-05-07 DIAGNOSIS — M150 Primary generalized (osteo)arthritis: Secondary | ICD-10-CM

## 2024-05-07 NOTE — Assessment & Plan Note (Signed)
 CBC today Encouraged smoking cessation

## 2024-05-07 NOTE — Assessment & Plan Note (Signed)
 Encourage diet and exercise for weight loss

## 2024-05-07 NOTE — Assessment & Plan Note (Addendum)
 Complicated by obesity C-Met and lipid profile today Continue rosuvastatin  40 mg, ezetimibe   10 mg daily He will go home and check his medications to see if he is taking evolocumab  140 mg every 2 weeks however he does not think he is Continue asa 81 mg daily Encouraged low-fat diet

## 2024-05-07 NOTE — Assessment & Plan Note (Signed)
 Asymptomatic Encourage smoking cessation He is not interested in starting daily maintenance inhaler therapy at this time

## 2024-05-07 NOTE — Assessment & Plan Note (Signed)
 He is unsure if he is taking tamsulosin  0.4 mg daily, will check his meds when he gets home and let me know Will monitor symptoms

## 2024-05-07 NOTE — Assessment & Plan Note (Signed)
 Currently a not an issue per his report We will monitor

## 2024-05-07 NOTE — Assessment & Plan Note (Signed)
 Complicated by obesity C-Met and lipid profile today Encouraged him to continue rosuvastatin  40 mg and ezetimibe  10 mg daily He is unsure if he is taking evolocumab  140 mg every 2 weeks but will check his medications when he gets home and let me know Encouraged low-fat diet

## 2024-05-07 NOTE — Assessment & Plan Note (Signed)
 CTA chest reviewed

## 2024-05-07 NOTE — Progress Notes (Signed)
 Subjective:    Patient ID: Craig Bruce, male    DOB: 11-15-1955, 68 y.o.   MRN: [id number]  HPI  Patient presents to clinic today for 30-month follow-up of chronic conditions.  DM2: Complicated by obesity.  His last A1c was 7.9, 01/2024.  He is taking lantus  and semaglutide  as prescribed. He is not sure if he is taking lisinopril  for renal protection.  He does not check his sugars.  He checks his feet routinely.  His last eye exam was 04/2022.  Flu 01/2024.  Pneumovax unsure.  Prevnar 10/2023.  COVID Pfizer x 3.  OA: He denies any joint pain at this time. He is not taking any medications for this. He does not follow with orthopedics.  Insomnia: He has difficulty staying asleep.  He is taking trazodone  but reports it does not keep him asleep. 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 There is no sleep study on file.  HLD with aortic atherosclerosis: Complicated by obesity and diabetes.  His last LDL was 200, triglycerides 224, 01/2024.  He is taking rosuvastatin  and ezetimibe  as prescribed. He does not think he is taking evolucamab. He is taking aspirin  as prescribed.  He tries to consume low-fat diet.  BPH: He reports urinary frequency.  He is unsure if he is taking tamsulosin  or not.  He does not follow with urology.  Thoracic aortic aneurysm: CT from 01/2024 reviewed.  He does not follow with vascular.  HTN: Complicated by diabetes.  His BP today is 118/72.  He is unsure if he is taking lisinopril  as prescribed.  ECG from 05/2022 reviewed.  Emphysema: He denies chronic cough or shortness of breath.  He does continue to smoke.  He is not using any inhalers at this time.  CT lung cancer screening from 07/2021 reviewed.  Polycythemia: His last H/H was 17.4/51.1, 01/2024.  He does smoke.  He does not follow with hematology.  Review of Systems     Past Medical History:  Diagnosis Date   Annual physical exam 03/24/2015   Arthritis     BOTH HANDS   Chronic kidney disease    H/O KIDNEY STONES   DVT (deep venous thrombosis) (HCC) 1996   Gastritis    Immunization due 03/24/2015   Need for influenza vaccination 03/24/2015   Obesity (BMI 30.0-34.9) 02/17/2015   Smoker 02/17/2015    Current Outpatient Medications  Medication Sig Dispense Refill   aspirin  81 MG EC tablet Take 1 tablet (81 mg total) by mouth daily. Swallow whole. (Patient not taking: Reported on 03/23/2024) 30 tablet 12   Evolocumab  (REPATHA  SURECLICK) 140 MG/ML SOAJ Inject 140 mg into the skin every 14 (fourteen) days. 2 mL 2   ezetimibe  (ZETIA ) 10 MG tablet Take 1 tablet (10 mg total) by mouth daily. 90 tablet 0   fluticasone  (FLONASE ) 50 MCG/ACT nasal spray Place 2 sprays into both nostrils daily. Use for 4-6 weeks then stop and use seasonally or as needed. (Patient not taking: Reported on 03/23/2024) 16 g 3   insulin  glargine (LANTUS  SOLOSTAR) 100 UNIT/ML Solostar Pen Inject 10 Units into the skin at bedtime. 15 mL 0   Insulin  Pen Needle 31G X 5 MM MISC BD Pen Needles- brand specific Inject insulin  via insulin  pen 6 x daily 90 each 1   lisinopril  (ZESTRIL ) 5 MG tablet Take 1 tablet (5 mg total) by mouth daily. 90 tablet 1   OZEMPIC , 0.25 OR  0.5 MG/DOSE, 2 MG/3ML SOPN Inject 0.5 mg into the skin once a week. Start with 0.25mg  weekly x 4 weeks then increase to 0.5mg  weekly injection. 9 mL 0   rosuvastatin  (CRESTOR ) 40 MG tablet TAKE 1 TABLET BY MOUTH EVERY DAY 90 tablet 1   tamsulosin  (FLOMAX ) 0.4 MG CAPS capsule Take 1 capsule (0.4 mg total) by mouth daily after breakfast. 90 capsule 0   traZODone  (DESYREL ) 50 MG tablet Take 1-2 tablets (50-100 mg total) by mouth at bedtime as needed. 90 tablet 0   No current facility-administered medications for this visit.    Allergies  Allergen Reactions   Other     BLOOD THINNER USED 20 YEARS AGO-PT UNSURE EXACTLY OF NAME-CAUSED FEVERS OF 104 PER PT    Family History  Problem Relation Age of Onset   Alcohol abuse  Mother    Alcohol abuse Father    Prostate cancer Neg Hx    Kidney cancer Neg Hx    Kidney failure Neg Hx    Colon cancer Neg Hx     Social History   Socioeconomic History   Marital status: Significant Other    Spouse name: Not on file   Number of children: 1   Years of education: Not on file   Highest education level: Not on file  Occupational History   Occupation: retired    Comment: Retired 2022  Tobacco Use   Smoking status: Every Day    Current packs/day: 1.00    Average packs/day: 1 pack/day for 24.0 years (24.0 ttl pk-yrs)    Types: Cigarettes   Smokeless tobacco: Never   Tobacco comments:    Previously smoked 1ppd.  Cutting back and down from 5 cigs / day to 4 cigs per day in 4 weeks.  Vaping Use   Vaping status: Never Used  Substance and Sexual Activity   Alcohol use: Yes    Alcohol/week: 2.0 standard drinks of alcohol    Types: 2 Cans of beer per week    Comment: BEER OCC   Drug use: No   Sexual activity: Not Currently  Other Topics Concern   Not on file  Social History Narrative   Not on file   Social Drivers of Health   Tobacco Use: High Risk (03/23/2024)   Patient History    Smoking Tobacco Use: Every Day    Smokeless Tobacco Use: Never    Passive Exposure: Not on file  Financial Resource Strain: Low Risk (03/23/2024)   Overall Financial Resource Strain (CARDIA)    Difficulty of Paying Living Expenses: Not hard at all  Food Insecurity: No Food Insecurity (03/23/2024)   Epic    Worried About Programme Researcher, Broadcasting/film/video in the Last Year: Never true    Ran Out of Food in the Last Year: Never true  Transportation Needs: No Transportation Needs (03/23/2024)   Epic    Lack of Transportation (Medical): No    Lack of Transportation (Non-Medical): No  Physical Activity: Sufficiently Active (03/23/2024)   Exercise Vital Sign    Days of Exercise per Week: 7 days    Minutes of Exercise per Session: 40 min  Stress: No Stress Concern Present (03/23/2024)    [employer] of Occupational Health - Occupational Stress Questionnaire    Feeling of Stress: Not at all  Social Connections: Socially Isolated (03/23/2024)   Social Connection and Isolation Panel    Frequency of Communication with Friends and Family: Once a week    Frequency of Social Gatherings  with Friends and Family: Never    Attends Religious Services: Never    Database Administrator or Organizations: No    Attends Banker Meetings: Never    Marital Status: Living with partner  Intimate Partner Violence: Not At Risk (03/23/2024)   Epic    Fear of Current or Ex-Partner: No    Emotionally Abused: No    Physically Abused: No    Sexually Abused: No  Depression (PHQ2-9): Low Risk (03/23/2024)   Depression (PHQ2-9)    PHQ-2 Score: 0  Alcohol Screen: Low Risk (03/23/2024)   Alcohol Screen    Last Alcohol Screening Score (AUDIT): 1  Housing: Unknown (03/23/2024)   Epic    Unable to Pay for Housing in the Last Year: No    Number of Times Moved in the Last Year: Not on file    Homeless in the Last Year: No  Utilities: Not At Risk (03/23/2024)   Epic    Threatened with loss of utilities: No  Health Literacy: Adequate Health Literacy (03/23/2024)   B1300 Health Literacy    Frequency of need for help with medical instructions: Never     Constitutional: Denies fever, malaise, fatigue, headache or abrupt weight changes.  HEENT: Denies eye pain, eye redness, ear pain, ringing in the ears, wax buildup, runny nose, nasal congestion, bloody nose, or sore throat. Respiratory: Denies difficulty breathing, shortness of breath, cough or sputum production.   Cardiovascular: Patient reports swelling in left leg.  Denies chest pain, chest tightness, palpitations or swelling in the hands.  Gastrointestinal: Denies abdominal pain, bloating, constipation, diarrhea or blood in the stool.  GU: Patient reports urinary frequency.  Denies urgency, frequency, pain with urination, burning  sensation, blood in urine, odor or discharge. Musculoskeletal: Denies decrease in range of motion, difficulty with gait, muscle pain or joint pain orswelling.  Skin: Denies redness, rashes, lesions or ulcercations.  Neurological: Patient reports insomnia.  Denies dizziness, difficulty with memory, difficulty with speech or problems with balance and coordination.  Psych: Denies anxiety, depression, SI/HI.  No other specific complaints in a complete review of systems (except as listed in HPI above).  Objective:   Physical Exam  BP 118/72 (BP Location: Left Arm, Patient Position: Sitting, Cuff Size: Large)   Ht 5' 7 (1.702 m)   Wt 208 lb (94.3 kg)   BMI 32.58 kg/m      Wt Readings from Last 3 Encounters:  01/24/24 205 lb 3.2 oz (93.1 kg)  10/26/23 197 lb (89.4 kg)  10/19/23 203 lb 2 oz (92.1 kg)    General: Appears his stated age, obese, in NAD. Skin: Warm, dry and intact. No  ulcerations noted. HEENT: Head: normal shape and size; Eyes: sclera white, no icterus, conjunctiva pink, PERRLA and EOMs intact;  Cardiovascular: Normal rate and rhythm. S1,S2 noted.  Murmur noted. No JVD.  Trace LLE edema. No carotid bruits noted. Pulmonary/Chest: Normal effort and diminished breath sounds. No respiratory distress. No wheezes, rales or ronchi noted.  Abdomen: Normal bowel sounds.  Musculoskeletal: No difficulty with gait.  Neurological: Alert and oriented. Coordination normal.  Psychiatric: Mood and affect normal. Behavior is normal. Judgment and thought content normal.    BMET    Component Value Date/Time   NA 138 01/27/2024 0834   NA 140 03/25/2015 0802   NA 137 05/19/2013 0254   K 4.3 01/27/2024 0834   K 3.9 05/19/2013 0254   CL 104 01/27/2024 0834   CL 107 05/19/2013 0254  CO2 26 01/27/2024 0834   CO2 25 05/19/2013 0254   GLUCOSE 227 (H) 01/27/2024 0834   GLUCOSE 124 (H) 05/19/2013 0254   BUN 13 01/27/2024 0834   BUN 15 03/25/2015 0802   BUN 14 05/19/2013 0254    CREATININE 0.76 01/27/2024 0834   CALCIUM  8.7 01/27/2024 0834   CALCIUM  8.5 05/19/2013 0254   GFRNONAA >60 11/01/2022 1151   GFRNONAA 96 11/03/2017 0821   GFRAA >60 12/09/2018 0500   GFRAA 111 11/03/2017 0821    Lipid Panel     Component Value Date/Time   CHOL 284 (H) 01/27/2024 0834   CHOL 243 (H) 03/25/2015 0802   TRIG 224 (H) 01/27/2024 0834   HDL 43 01/27/2024 0834   HDL 38 (L) 03/25/2015 0802   CHOLHDL 6.6 (H) 01/27/2024 0834   VLDL 48 (H) 10/01/2016 1140   LDLCALC 200 (H) 01/27/2024 0834    CBC    Component Value Date/Time   WBC 6.3 01/27/2024 0834   RBC 5.47 01/27/2024 0834   HGB 17.4 (H) 01/27/2024 0834   HGB 16.0 03/25/2015 0802   HCT 51.1 (H) 01/27/2024 0834   HCT 45.1 03/25/2015 0802   PLT 205 01/27/2024 0834   PLT 190 03/25/2015 0802   MCV 93.4 01/27/2024 0834   MCV 90 03/25/2015 0802   MCV 89 05/19/2013 0254   MCH 31.8 01/27/2024 0834   MCHC 34.1 01/27/2024 0834   RDW 12.3 01/27/2024 0834   RDW 13.4 03/25/2015 0802   RDW 13.2 05/19/2013 0254   LYMPHSABS 2.2 12/09/2018 0500   LYMPHSABS 1.6 03/25/2015 0802   LYMPHSABS 1.4 05/19/2013 0254   MONOABS 0.8 12/09/2018 0500   MONOABS 1.2 (H) 05/19/2013 0254   EOSABS 0.1 12/09/2018 0500   EOSABS 0.2 03/25/2015 0802   EOSABS 0.1 05/19/2013 0254   BASOSABS 0.0 12/09/2018 0500   BASOSABS 0.0 03/25/2015 0802   BASOSABS 0.1 05/19/2013 0254    Hgb A1C Lab Results  Component Value Date   HGBA1C 7.9 (H) 01/27/2024           Assessment & Plan:     RTC in 6 months for your annual exam Angeline Laura, NP

## 2024-05-07 NOTE — Assessment & Plan Note (Signed)
 Complicated by obesity He is unsure if he is taking lisinopril  5 mg daily, advised him to go home and check his medications and let me know Reinforced DASH diet and exercise for weight loss C-Met today

## 2024-05-07 NOTE — Assessment & Plan Note (Addendum)
 Complicated by obesity A1c today Urine microalbumin has been checked within the last year Continue  lantus  10 units daily and semaglutide  0.5 mg weekly  Consider increasing semaglutide  to 1 mg weekly pending labs Encourage low-carb diet and exercise for weight loss Encouraged routine eye exam Encouraged routine foot exam Immunizations UTD

## 2024-05-07 NOTE — Assessment & Plan Note (Signed)
 Continue trazodone  100 mg at bedtime He declines increasing dose or changing to alternative medication due to his caregiver responsibilities at this time We will monitor

## 2024-05-07 NOTE — Patient Instructions (Signed)

## 2024-05-08 ENCOUNTER — Ambulatory Visit: Payer: Self-pay | Admitting: Internal Medicine

## 2024-05-08 ENCOUNTER — Other Ambulatory Visit: Payer: Self-pay

## 2024-05-08 DIAGNOSIS — E1165 Type 2 diabetes mellitus with hyperglycemia: Secondary | ICD-10-CM

## 2024-05-08 DIAGNOSIS — E782 Mixed hyperlipidemia: Secondary | ICD-10-CM

## 2024-05-08 DIAGNOSIS — E1169 Type 2 diabetes mellitus with other specified complication: Secondary | ICD-10-CM

## 2024-05-08 DIAGNOSIS — I700 Atherosclerosis of aorta: Secondary | ICD-10-CM

## 2024-05-08 LAB — CBC
HCT: 54.8 % — ABNORMAL HIGH (ref 39.4–51.1)
Hemoglobin: 18.7 g/dL — ABNORMAL HIGH (ref 13.2–17.1)
MCH: 30.7 pg (ref 27.0–33.0)
MCHC: 34.1 g/dL (ref 31.6–35.4)
MCV: 90 fL (ref 81.4–101.7)
MPV: 10.2 fL (ref 7.5–12.5)
Platelets: 230 Thousand/uL (ref 140–400)
RBC: 6.09 Million/uL — ABNORMAL HIGH (ref 4.20–5.80)
RDW: 12.6 % (ref 11.0–15.0)
WBC: 6.8 Thousand/uL (ref 3.8–10.8)

## 2024-05-08 LAB — COMPREHENSIVE METABOLIC PANEL WITH GFR
AG Ratio: 1.5 (calc) (ref 1.0–2.5)
ALT: 24 U/L (ref 9–46)
AST: 24 U/L (ref 10–35)
Albumin: 4.4 g/dL (ref 3.6–5.1)
Alkaline phosphatase (APISO): 79 U/L (ref 35–144)
BUN: 10 mg/dL (ref 7–25)
CO2: 26 mmol/L (ref 20–32)
Calcium: 9.4 mg/dL (ref 8.6–10.3)
Chloride: 102 mmol/L (ref 98–110)
Creat: 0.92 mg/dL (ref 0.70–1.35)
Globulin: 3 g/dL (ref 1.9–3.7)
Glucose, Bld: 183 mg/dL — ABNORMAL HIGH (ref 65–99)
Potassium: 4.6 mmol/L (ref 3.5–5.3)
Sodium: 139 mmol/L (ref 135–146)
Total Bilirubin: 1 mg/dL (ref 0.2–1.2)
Total Protein: 7.4 g/dL (ref 6.1–8.1)
eGFR: 90 mL/min/1.73m2 (ref >=60)

## 2024-05-08 LAB — HEMOGLOBIN A1C
Hgb A1c MFr Bld: 8.5 % — ABNORMAL HIGH (ref <5.7)
Mean Plasma Glucose: 197 mg/dL
eAG (mmol/L): 10.9 mmol/L

## 2024-05-08 LAB — LIPID PANEL
Cholesterol: 287 mg/dL — ABNORMAL HIGH (ref <200)
HDL: 48 mg/dL (ref >=40)
LDL Cholesterol (Calc): 196 mg/dL — ABNORMAL HIGH
Non-HDL Cholesterol (Calc): 239 mg/dL — ABNORMAL HIGH (ref <130)
Total CHOL/HDL Ratio: 6 (calc) — ABNORMAL HIGH (ref <5.0)
Triglycerides: 233 mg/dL — ABNORMAL HIGH (ref <150)

## 2024-05-08 MED ORDER — LANTUS SOLOSTAR 100 UNIT/ML ~~LOC~~ SOPN: 14.0000 [IU] | PEN_INJECTOR | Freq: Every day | SUBCUTANEOUS | 0 refills | Status: AC

## 2024-05-08 MED ORDER — INSULIN PEN NEEDLE 31G X 5 MM MISC: 1 refills | Status: AC

## 2024-05-08 MED ORDER — SEMAGLUTIDE (1 MG/DOSE) 4 MG/3ML ~~LOC~~ SOPN: 1.0000 mg | PEN_INJECTOR | SUBCUTANEOUS | 0 refills | Status: AC

## 2024-05-08 MED ORDER — ROSUVASTATIN CALCIUM 40 MG PO TABS: 40.0000 mg | ORAL_TABLET | Freq: Every day | ORAL | 1 refills | Status: AC

## 2024-05-08 MED ORDER — EZETIMIBE 10 MG PO TABS: 10.0000 mg | ORAL_TABLET | Freq: Every day | ORAL | 0 refills | Status: AC

## 2024-05-08 MED ORDER — REPATHA SURECLICK 140 MG/ML ~~LOC~~ SOAJ: 140.0000 mg | SUBCUTANEOUS | 2 refills | Status: AC

## 2024-05-08 NOTE — Telephone Encounter (Signed)
 Requested medication (s) are due for refill today - unsure  Requested medication (s) are on the active medication list -yes  Future visit scheduled -yes  Last refill: 02/09/24  Notes to clinic: listed as historical- sent for review   Requested Prescriptions  Pending Prescriptions Disp Refills   cetirizine  (ZYRTEC ) 10 MG tablet [Pharmacy Med Name: CETIRIZINE  HCL 10 MG TABLET] 90 tablet 1    Sig: TAKE 1 TABLET BY MOUTH EVERY DAY     Ear, Nose, and Throat:  Antihistamines 2 Passed - 05/08/2024  4:21 PM      Passed - Cr in normal range and within 360 days    Creat  Date Value Ref Range Status  05/07/2024 0.92 0.70 - 1.35 mg/dL Final   Creatinine, Urine  Date Value Ref Range Status  01/27/2024 46 20 - 320 mg/dL Final         Passed - Valid encounter within last 12 months    Recent Outpatient Visits           Yesterday Type 2 diabetes mellitus with hyperglycemia, with long-term current use of insulin  [facility])   [place] [facility] [place], [place], NP   3 months ago Thoracic aortic aneurysm without rupture, unspecified part   [place] [facility] [place], [place] W, NP   6 months ago Encounter for general adult medical examination with abnormal findings   [place] [facility] [place], Angeline ORN, NP   6 months ago Seasonal allergic rhinitis due to pollen   [facility] [place], Angeline ORN, NP              Refused Prescriptions Disp Refills   rosuvastatin  (CRESTOR ) 40 MG tablet [Pharmacy Med Name: ROSUVASTATIN  CALCIUM  40 MG TAB] 90 tablet 1    Sig: TAKE 1 TABLET BY MOUTH EVERY DAY     Cardiovascular:  Antilipid - Statins 2 Failed - 05/08/2024  4:21 PM      Failed - Lipid Panel in normal range within the last 12 months    Cholesterol, Total  Date Value Ref Range Status  03/25/2015 243 (H) 100 - 199 mg/dL Final   Cholesterol  Date Value Ref Range Status  05/07/2024 287 (H) <200 mg/dL Final   LDL  Cholesterol (Calc)  Date Value Ref Range Status  05/07/2024 196 (H) mg/dL (calc) Final    Comment:    LDL-C levels > or = 190 mg/dL may indicate familial  hypercholesterolemia (FH). Clinical assessment and  measurement of blood lipid levels should be  considered for all first degree relatives of patients with an FH diagnosis. LDL Cholesterol (LDL-C) levels > or = 300 mg/dL may indicate homozygous familial hypercholesterolemia (HoFH). Untreated,  these extremely high LDL-C levels can result in premature CV events and mortality. Patients should be identified early and provided appropriate interventions to reduce the cumulative LDL-C burden from birth. . For questions about testing for familial hypercholesterolemia, please call [employer] at [email or web address]. SABRA Veatrice DASEN, et al. J National Lipid Association  Recommendations for Patient-Centered Management of Dyslipidemia: Part 1 Journal of  Clinical Lipidology 2015;9(2), 129-169. Cuchel, M. et al. (2014). Homozygous familial hypercholesterolaemia: new insights and guidance for clinicians to improve detection and clinical management. European Heart Journal, 35(32), [phone]. Reference range: <100 . Desirable range <100 mg/dL for primary prevention;   <70 mg/dL for patients with CHD or diabetic patients  with > or = 2 CHD risk factors. SABRA  LDL-C is now calculated using the Martin-Hopkins  calculation, which is a validated novel method providing  better accuracy than the Friedewald equation in the  estimation of LDL-C.  Gladis APPLETHWAITE et al. SANDREA. 7986;689(80): 2061-2068  (http://education.[facility].com/faq/FAQ164)    HDL  Date Value Ref Range Status  05/07/2024 48 > OR = 40 mg/dL Final  88/98/7983 38 (L) >39 mg/dL Final    Comment:    According to ATP-III Guidelines, HDL-C >59 mg/dL is considered a negative risk factor for CHD.    Triglycerides  Date Value Ref Range Status  05/07/2024 233 (H)  <150 mg/dL Final    Comment:    . If a non-fasting specimen was collected, consider repeat triglyceride testing on a fasting specimen if clinically indicated.  Veatrice et al. J. of Clin. Lipidol. 2015;9:129-169. SABRA          Passed - Cr in normal range and within 360 days    Creat  Date Value Ref Range Status  05/07/2024 0.92 0.70 - 1.35 mg/dL Final   Creatinine, Urine  Date Value Ref Range Status  01/27/2024 46 20 - 320 mg/dL Final         Passed - Patient is not pregnant      Passed - Valid encounter within last 12 months    Recent Outpatient Visits           Yesterday Type 2 diabetes mellitus with hyperglycemia, with long-term current use of insulin  [facility])   [place] [facility] [place], Angeline ORN, NP   3 months ago Thoracic aortic aneurysm without rupture, unspecified part   [place] [facility] [place], Angeline ORN, NP   6 months ago Encounter for general adult medical examination with abnormal findings   [place] [facility] [place], [place] W, NP   6 months ago Seasonal allergic rhinitis due to pollen   [facility] [place], Angeline ORN, NP                 Requested Prescriptions  Pending Prescriptions Disp Refills   cetirizine  (ZYRTEC ) 10 MG tablet [Pharmacy Med Name: CETIRIZINE  HCL 10 MG TABLET] 90 tablet 1    Sig: TAKE 1 TABLET BY MOUTH EVERY DAY     Ear, Nose, and Throat:  Antihistamines 2 Passed - 05/08/2024  4:21 PM      Passed - Cr in normal range and within 360 days    Creat  Date Value Ref Range Status  05/07/2024 0.92 0.70 - 1.35 mg/dL Final   Creatinine, Urine  Date Value Ref Range Status  01/27/2024 46 20 - 320 mg/dL Final         Passed - Valid encounter within last 12 months    Recent Outpatient Visits           Yesterday Type 2 diabetes mellitus with hyperglycemia, with long-term current use of insulin  [facility])   [place] [facility] [place], [place], NP   3 months ago Thoracic aortic aneurysm without rupture, unspecified part   [place] [facility] [place], Angeline ORN, NP   6 months ago Encounter for general adult medical examination with abnormal findings   [place] [facility] [place], [place] W, NP   6 months ago Seasonal allergic rhinitis due to pollen   [facility] [place], Angeline ORN, NP  Refused Prescriptions Disp Refills   rosuvastatin  (CRESTOR ) 40 MG tablet [Pharmacy Med Name: ROSUVASTATIN  CALCIUM  40 MG TAB] 90 tablet 1    Sig: TAKE 1 TABLET BY MOUTH EVERY DAY     Cardiovascular:  Antilipid - Statins 2 Failed - 05/08/2024  4:21 PM      Failed - Lipid Panel in normal range within the last 12 months    Cholesterol, Total  Date Value Ref Range Status  03/25/2015 243 (H) 100 - 199 mg/dL Final   Cholesterol  Date Value Ref Range Status  05/07/2024 287 (H) <200 mg/dL Final   LDL Cholesterol (Calc)  Date Value Ref Range Status  05/07/2024 196 (H) mg/dL (calc) Final    Comment:    LDL-C levels > or = 190 mg/dL may indicate familial  hypercholesterolemia (FH). Clinical assessment and  measurement of blood lipid levels should be  considered for all first degree relatives of patients with an FH diagnosis. LDL Cholesterol (LDL-C) levels > or = 300 mg/dL may indicate homozygous familial hypercholesterolemia (HoFH). Untreated,  these extremely high LDL-C levels can result in premature CV events and mortality. Patients should be identified early and provided appropriate interventions to reduce the cumulative LDL-C burden from birth. . For questions about testing for familial hypercholesterolemia, please call [employer] at [email or web address]. SABRA Veatrice DASEN, et al. J National Lipid Association  Recommendations for Patient-Centered Management of Dyslipidemia: Part 1 Journal of  Clinical Lipidology 2015;9(2), 129-169. Cuchel, M. et  al. (2014). Homozygous familial hypercholesterolaemia: new insights and guidance for clinicians to improve detection and clinical management. European Heart Journal, 35(32), [phone]. Reference range: <100 . Desirable range <100 mg/dL for primary prevention;   <70 mg/dL for patients with CHD or diabetic patients  with > or = 2 CHD risk factors. SABRA LDL-C is now calculated using the Martin-Hopkins  calculation, which is a validated novel method providing  better accuracy than the Friedewald equation in the  estimation of LDL-C.  Gladis APPLETHWAITE et al. SANDREA. 7986;689(80): 2061-2068  (http://education.[facility].com/faq/FAQ164)    HDL  Date Value Ref Range Status  05/07/2024 48 > OR = 40 mg/dL Final  88/98/7983 38 (L) >39 mg/dL Final    Comment:    According to ATP-III Guidelines, HDL-C >59 mg/dL is considered a negative risk factor for CHD.    Triglycerides  Date Value Ref Range Status  05/07/2024 233 (H) <150 mg/dL Final    Comment:    . If a non-fasting specimen was collected, consider repeat triglyceride testing on a fasting specimen if clinically indicated.  Veatrice et al. J. of Clin. Lipidol. 2015;9:129-169. SABRA          Passed - Cr in normal range and within 360 days    Creat  Date Value Ref Range Status  05/07/2024 0.92 0.70 - 1.35 mg/dL Final   Creatinine, Urine  Date Value Ref Range Status  01/27/2024 46 20 - 320 mg/dL Final         Passed - Patient is not pregnant      Passed - Valid encounter within last 12 months    Recent Outpatient Visits           Yesterday Type 2 diabetes mellitus with hyperglycemia, with long-term current use of insulin  [facility])   [facility] [place], Angeline ORN, NP   3 months ago Thoracic aortic aneurysm without rupture, unspecified part   [facility] [place] [place], Angeline ORN, NP   6  months ago Encounter for general adult medical examination with abnormal findings   [place] [facility] [place], [place] W, NP   6 months ago Seasonal allergic rhinitis due to pollen   [facility] [place], Angeline ORN, [place]

## 2024-05-08 NOTE — Telephone Encounter (Signed)
 Duplicate request- filled 12/16 Requested Prescriptions  Pending Prescriptions Disp Refills   cetirizine  (ZYRTEC ) 10 MG tablet [Pharmacy Med Name: CETIRIZINE  HCL 10 MG TABLET] 90 tablet 1    Sig: TAKE 1 TABLET BY MOUTH EVERY DAY     Ear, Nose, and Throat:  Antihistamines 2 Passed - 05/08/2024  4:21 PM      Passed - Cr in normal range and within 360 days    Creat  Date Value Ref Range Status  05/07/2024 0.92 0.70 - 1.35 mg/dL Final   Creatinine, Urine  Date Value Ref Range Status  01/27/2024 46 20 - 320 mg/dL Final         Passed - Valid encounter within last 12 months    Recent Outpatient Visits           Yesterday Type 2 diabetes mellitus with hyperglycemia, with long-term current use of insulin  [facility])   [place] [facility] [place], [place], NP   3 months ago Thoracic aortic aneurysm without rupture, unspecified part   [place] [facility] [place], [place] W, NP   6 months ago Encounter for general adult medical examination with abnormal findings   [facility] [place], Angeline ORN, NP   6 months ago Seasonal allergic rhinitis due to pollen   [facility] [place], Angeline ORN, NP              Refused Prescriptions Disp Refills   rosuvastatin  (CRESTOR ) 40 MG tablet [Pharmacy Med Name: ROSUVASTATIN  CALCIUM  40 MG TAB] 90 tablet 1    Sig: TAKE 1 TABLET BY MOUTH EVERY DAY     Cardiovascular:  Antilipid - Statins 2 Failed - 05/08/2024  4:21 PM      Failed - Lipid Panel in normal range within the last 12 months    Cholesterol, Total  Date Value Ref Range Status  03/25/2015 243 (H) 100 - 199 mg/dL Final   Cholesterol  Date Value Ref Range Status  05/07/2024 287 (H) <200 mg/dL Final   LDL Cholesterol (Calc)  Date Value Ref Range Status  05/07/2024 196 (H) mg/dL (calc) Final    Comment:    LDL-C levels > or = 190 mg/dL may indicate familial  hypercholesterolemia (FH). Clinical assessment and   measurement of blood lipid levels should be  considered for all first degree relatives of patients with an FH diagnosis. LDL Cholesterol (LDL-C) levels > or = 300 mg/dL may indicate homozygous familial hypercholesterolemia (HoFH). Untreated,  these extremely high LDL-C levels can result in premature CV events and mortality. Patients should be identified early and provided appropriate interventions to reduce the cumulative LDL-C burden from birth. . For questions about testing for familial hypercholesterolemia, please call [employer], [employer] at [email or web address]. SABRA Veatrice DASEN, et al. J National Lipid Association  Recommendations for Patient-Centered Management of Dyslipidemia: Part 1 Journal of  Clinical Lipidology 2015;9(2), 129-169. Cuchel, M. et al. (2014). Homozygous familial hypercholesterolaemia: new insights and guidance for clinicians to improve detection and clinical management. European Heart Journal, 35(32), [phone]. Reference range: <100 . Desirable range <100 mg/dL for primary prevention;   <70 mg/dL for patients with CHD or diabetic patients  with > or = 2 CHD risk factors. SABRA LDL-C is now calculated using the Martin-Hopkins  calculation, which is a validated novel method providing  better accuracy than the Friedewald equation in the  estimation of LDL-C.  Gladis APPLETHWAITE et al. SANDREA. 7986;689(80): [phone]  (  http://education.[facility].com/faq/FAQ164)    HDL  Date Value Ref Range Status  05/07/2024 48 > OR = 40 mg/dL Final  88/98/7983 38 (L) >39 mg/dL Final    Comment:    According to ATP-III Guidelines, HDL-C >59 mg/dL is considered a negative risk factor for CHD.    Triglycerides  Date Value Ref Range Status  05/07/2024 233 (H) <150 mg/dL Final    Comment:    . If a non-fasting specimen was collected, consider repeat triglyceride testing on a fasting specimen if clinically indicated.  Veatrice et al. J. of Clin. Lipidol.  2015;9:129-169. SABRA          Passed - Cr in normal range and within 360 days    Creat  Date Value Ref Range Status  05/07/2024 0.92 0.70 - 1.35 mg/dL Final   Creatinine, Urine  Date Value Ref Range Status  01/27/2024 46 20 - 320 mg/dL Final         Passed - Patient is not pregnant      Passed - Valid encounter within last 12 months    Recent Outpatient Visits           Yesterday Type 2 diabetes mellitus with hyperglycemia, with long-term current use of insulin  [facility])   [place] [facility] [place], [place], NP   3 months ago Thoracic aortic aneurysm without rupture, unspecified part   [facility] [place], Angeline ORN, NP   6 months ago Encounter for general adult medical examination with abnormal findings    [facility] [place], Angeline ORN, NP   6 months ago Seasonal allergic rhinitis due to pollen   [facility] [place], Angeline ORN, NP

## 2024-05-21 LAB — OPHTHALMOLOGY REPORT-SCANNED

## 2024-08-06 ENCOUNTER — Ambulatory Visit: Admitting: Internal Medicine

## 2024-10-30 ENCOUNTER — Encounter: Admitting: Internal Medicine

## 2025-03-29 ENCOUNTER — Ambulatory Visit
